# Patient Record
Sex: Male | Born: 1950 | Race: Black or African American | Hispanic: No | Smoking: Never smoker
Health system: Southern US, Community
[De-identification: ages and names within clinical notes are randomized; demographics above are authoritative.]

## PROBLEM LIST (undated history)

## (undated) ENCOUNTER — Emergency Department (HOSPITAL_COMMUNITY): Admission: EM

## (undated) DIAGNOSIS — D7581 Myelofibrosis: Secondary | ICD-10-CM

## (undated) DIAGNOSIS — M1990 Unspecified osteoarthritis, unspecified site: Secondary | ICD-10-CM

## (undated) DIAGNOSIS — C61 Malignant neoplasm of prostate: Secondary | ICD-10-CM

## (undated) DIAGNOSIS — Z923 Personal history of irradiation: Secondary | ICD-10-CM

## (undated) DIAGNOSIS — F1010 Alcohol abuse, uncomplicated: Secondary | ICD-10-CM

## (undated) DIAGNOSIS — D649 Anemia, unspecified: Secondary | ICD-10-CM

## (undated) DIAGNOSIS — G5600 Carpal tunnel syndrome, unspecified upper limb: Secondary | ICD-10-CM

## (undated) HISTORY — PX: TENDON REPAIR: SHX5111

## (undated) HISTORY — DX: Myelofibrosis: D75.81

## (undated) HISTORY — DX: Personal history of irradiation: Z92.3

## (undated) HISTORY — DX: Carpal tunnel syndrome, unspecified upper limb: G56.00

## (undated) HISTORY — DX: Alcohol abuse, uncomplicated: F10.10

## (undated) HISTORY — PX: CYSTECTOMY: SUR359

## (undated) HISTORY — DX: Anemia, unspecified: D64.9

## (undated) HISTORY — DX: Unspecified osteoarthritis, unspecified site: M19.90

---

## 2002-09-03 ENCOUNTER — Encounter: Payer: Self-pay | Admitting: General Practice

## 2002-09-03 ENCOUNTER — Encounter: Admission: RE | Admit: 2002-09-03 | Discharge: 2002-09-03 | Payer: Self-pay | Admitting: General Practice

## 2006-10-17 ENCOUNTER — Ambulatory Visit (HOSPITAL_COMMUNITY): Admission: RE | Admit: 2006-10-17 | Discharge: 2006-10-17 | Payer: Self-pay | Admitting: General Surgery

## 2008-03-17 ENCOUNTER — Ambulatory Visit (HOSPITAL_COMMUNITY): Admission: RE | Admit: 2008-03-17 | Discharge: 2008-03-17 | Payer: Self-pay | Admitting: Family Medicine

## 2008-03-17 ENCOUNTER — Encounter: Payer: Self-pay | Admitting: Orthopedic Surgery

## 2008-03-18 ENCOUNTER — Encounter: Payer: Self-pay | Admitting: Orthopedic Surgery

## 2008-04-14 ENCOUNTER — Ambulatory Visit: Payer: Self-pay | Admitting: Orthopedic Surgery

## 2008-04-14 DIAGNOSIS — M109 Gout, unspecified: Secondary | ICD-10-CM | POA: Insufficient documentation

## 2008-05-12 ENCOUNTER — Ambulatory Visit (HOSPITAL_COMMUNITY): Admission: RE | Admit: 2008-05-12 | Discharge: 2008-05-12 | Payer: Self-pay | Admitting: General Surgery

## 2009-08-10 ENCOUNTER — Ambulatory Visit (HOSPITAL_COMMUNITY): Admission: RE | Admit: 2009-08-10 | Discharge: 2009-08-10 | Payer: Self-pay | Admitting: Family Medicine

## 2009-08-13 ENCOUNTER — Ambulatory Visit (HOSPITAL_COMMUNITY): Admission: RE | Admit: 2009-08-13 | Discharge: 2009-08-13 | Payer: Self-pay | Admitting: Family Medicine

## 2010-06-09 ENCOUNTER — Emergency Department (HOSPITAL_COMMUNITY): Admission: EM | Admit: 2010-06-09 | Discharge: 2010-06-09 | Payer: Self-pay | Admitting: Emergency Medicine

## 2010-08-18 IMAGING — CR DG LUMBAR SPINE COMPLETE 4+V
5 series · 5 of 5 positions shown · non-contrast
Comparison: None available.

CLINICAL DATA: Low back pain.

LUMBAR SPINE - COMPLETE 4+ VIEW

[view not recorded (1 of 5)]
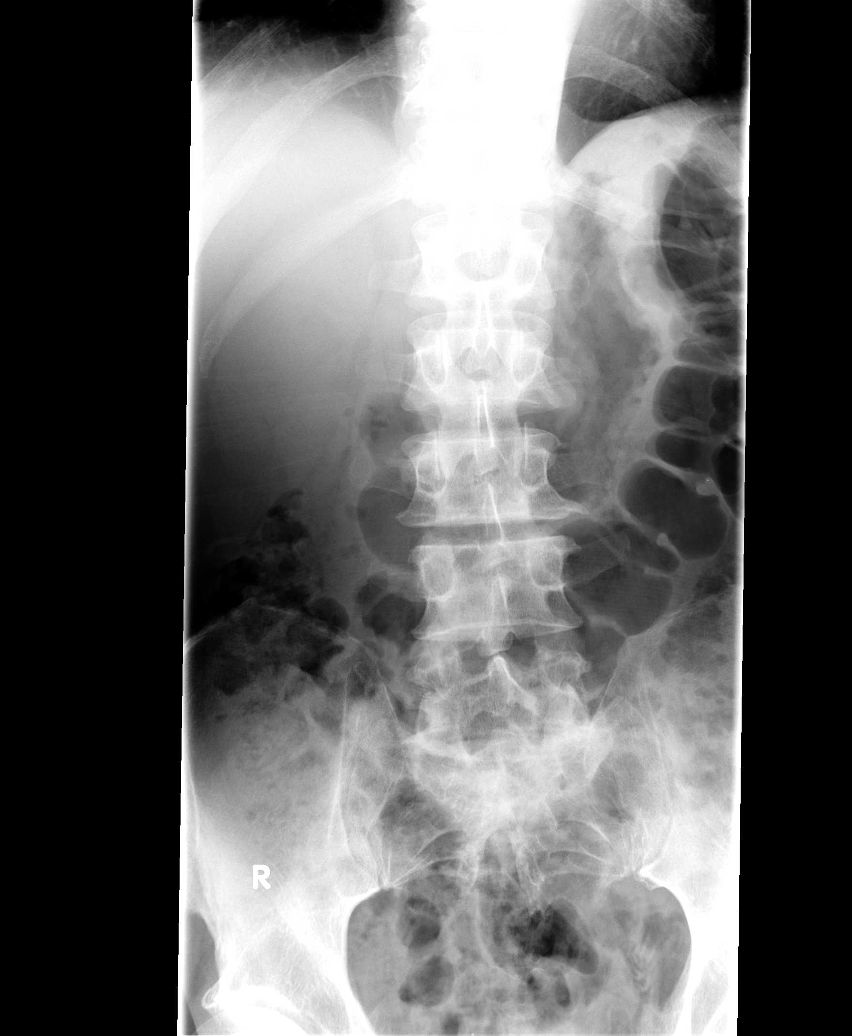

[view not recorded (2 of 5)]
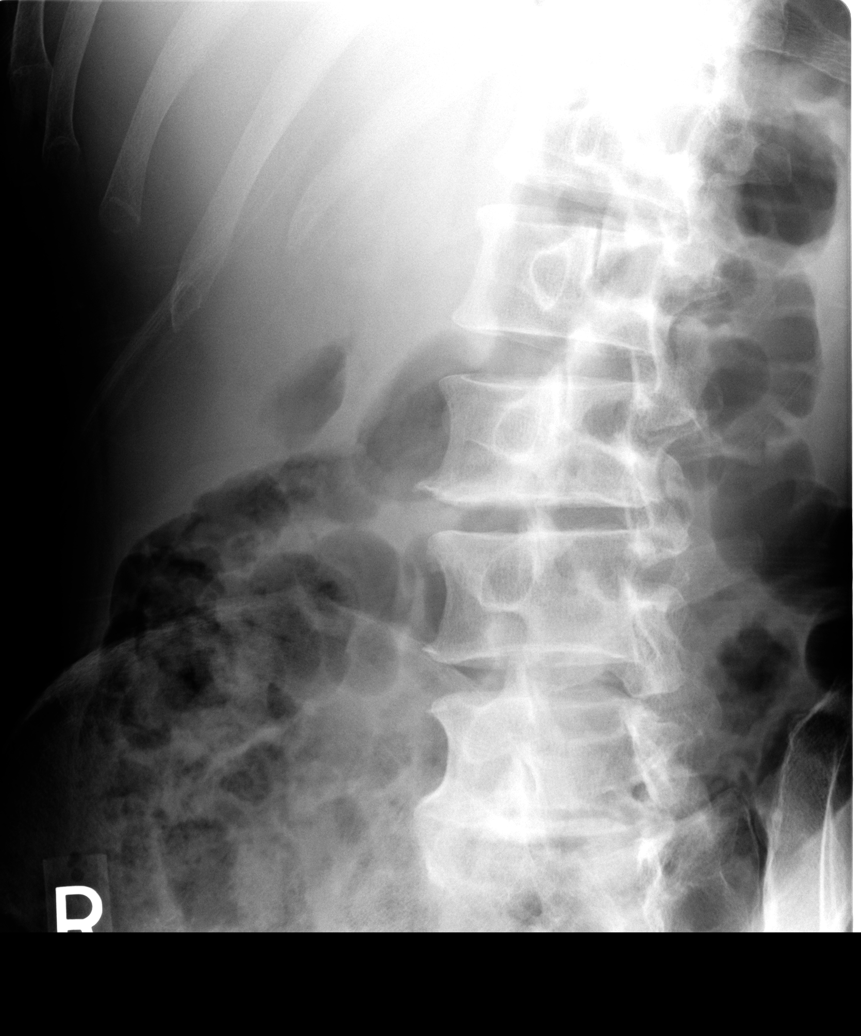

[view not recorded (3 of 5)]
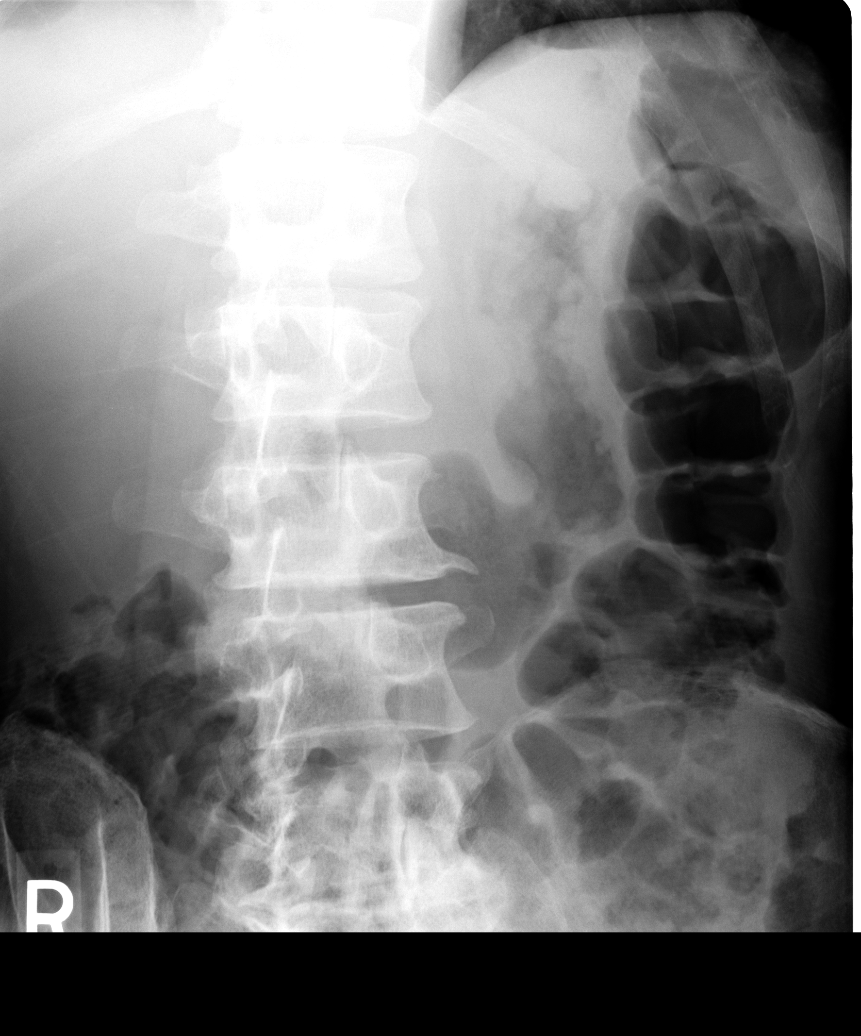

[view not recorded (4 of 5)]
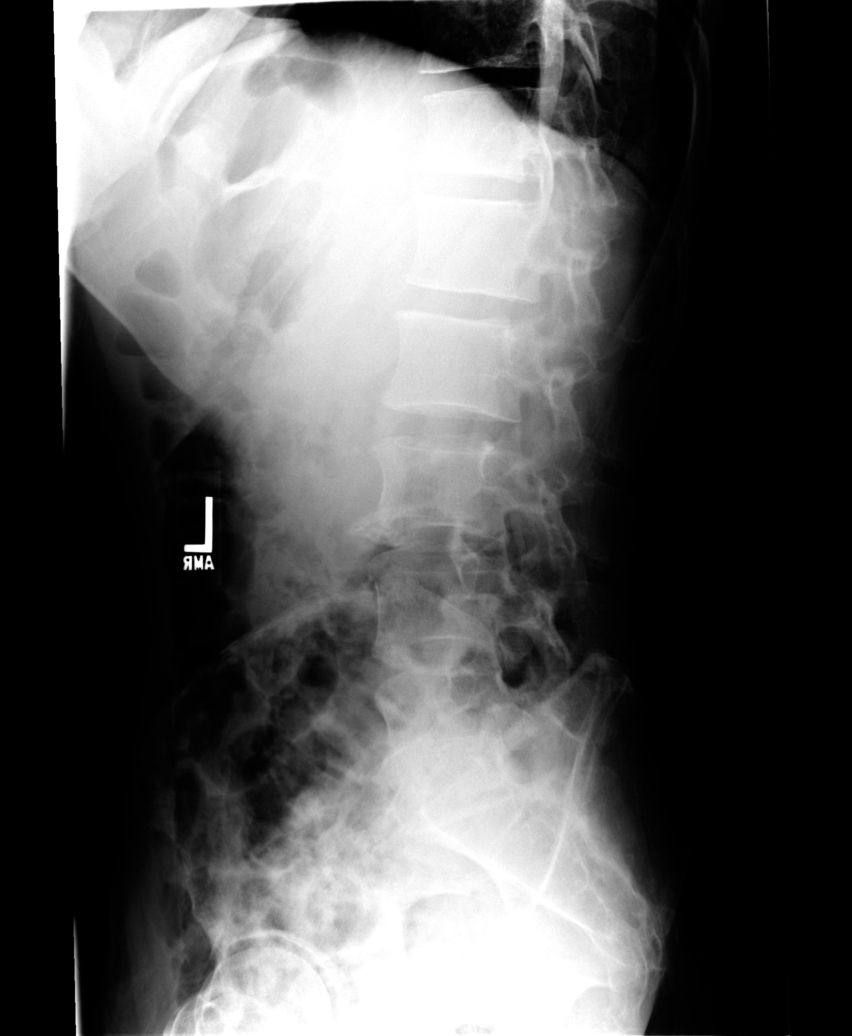

[view not recorded (5 of 5)]
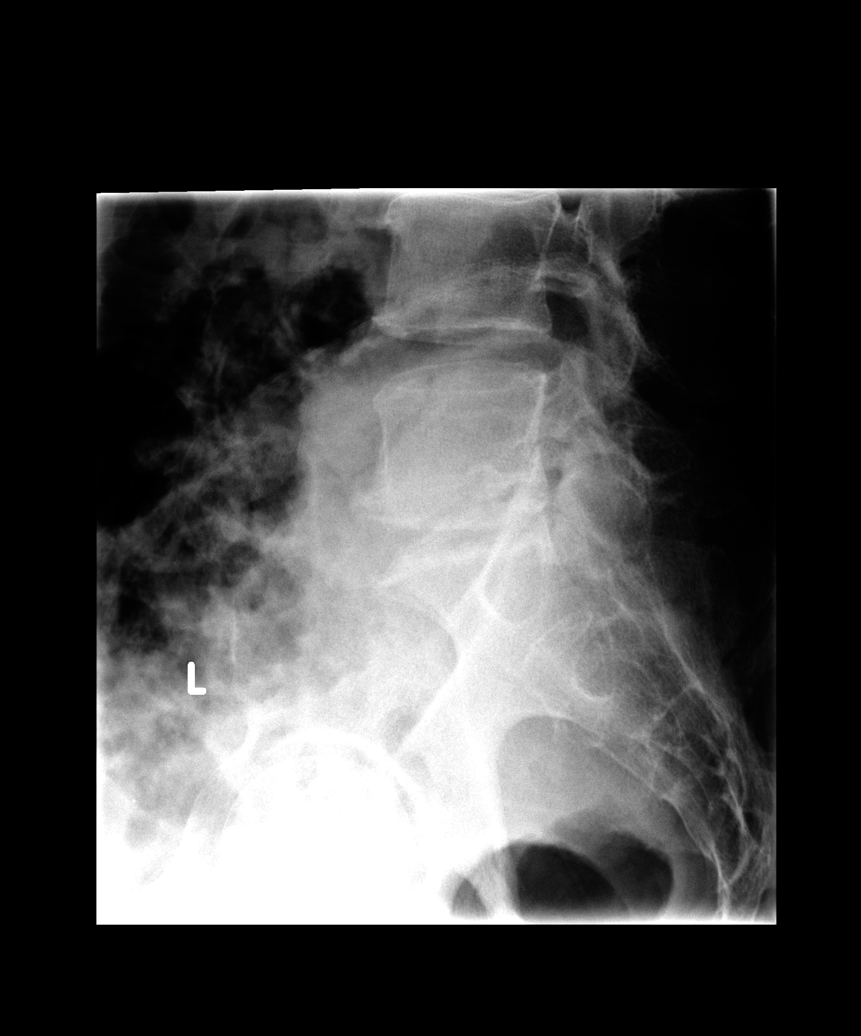

[5 of 5 positions shown; findings below may reference images not displayed]

FINDINGS: Vertebral body height and alignment are maintained.
Anterior endplate spurring is most prominent at L3-4 and L5-S1.
IMPRESSION: No acute finding.  Lumbar degenerative disease noted.

## 2011-03-05 LAB — URIC ACID: Uric Acid, Serum: 7.8 mg/dL (ref 4.0–7.8)

## 2011-05-02 NOTE — H&P (Signed)
NAME:  Ricardo [name], Ricardo [name]              ACCOUNT NO.:  [id number]   MEDICAL RECORD NO.:  [id number]          PATIENT TYPE:  AMB   LOCATION:  DAY                           FACILITY:  [facility]   PHYSICIAN:  Dalia Heading, M.D.  DATE OF BIRTH:  06-Nov-1951   DATE OF ADMISSION:  DATE OF DISCHARGE:  LH                              HISTORY & PHYSICAL   CHIEF COMPLAINT:  Need for screening colonoscopy.   HISTORY OF PRESENT ILLNESS:  The patient is a 60 year old black male who  is referred for endoscopic evaluation.  He needs colonoscopy for  screening purposes.  No abdominal pain, weight loss, nausea, vomiting,  diarrhea, constipation, melena, or hematochezia have been noted.  He has  never had a colonoscopy.  There is no family history of colon carcinoma.   PAST MEDICAL HISTORY:  Unremarkable.   PAST SURGICAL HISTORY:  Right hand surgery.   CURRENT MEDICATIONS:  None.   ALLERGIES:  No known drug allergies.   REVIEW OF SYSTEMS:  Noncontributory.   PHYSICAL EXAMINATION:  GENERAL:  The patient is a well-developed and  well-nourished black male in no acute distress.  LUNGS:  Clear to auscultation with equal breath sounds bilaterally.  HEART:  Regular rate and rhythm without S3, S4, or murmurs.  ABDOMEN:  Soft, nontender, and nondistended.  No hepatosplenomegaly or  masses are noted.  RECTAL:  Deferred to the procedure.   IMPRESSION:  Need for screening colonoscopy.   PLAN:  The patient is scheduled for a colonoscopy on 05/12/2008.  The  risks and benefits of the procedure including bleeding and perforation  were fully explained to the patient, given informed consent.      Dalia Heading, M.D.  Electronically Signed     MAJ/MEDQ  D:  05/05/2008  T:  05/06/2008  Job:  [id number]   cc:   Patrica Duel, M.D.  Fax: [phone]

## 2011-08-07 ENCOUNTER — Ambulatory Visit (INDEPENDENT_AMBULATORY_CARE_PROVIDER_SITE_OTHER): Payer: BC Managed Care – PPO | Admitting: Family Medicine

## 2011-08-07 ENCOUNTER — Encounter: Payer: Self-pay | Admitting: Family Medicine

## 2011-08-07 VITALS — BP 132/78 | HR 77 | Ht 71.0 in | Wt 144.0 lb

## 2011-08-07 DIAGNOSIS — M25449 Effusion, unspecified hand: Secondary | ICD-10-CM

## 2011-08-07 DIAGNOSIS — F17200 Nicotine dependence, unspecified, uncomplicated: Secondary | ICD-10-CM

## 2011-08-07 DIAGNOSIS — Z720 Tobacco use: Secondary | ICD-10-CM

## 2011-08-07 DIAGNOSIS — M109 Gout, unspecified: Secondary | ICD-10-CM

## 2011-08-07 MED ORDER — ALLOPURINOL 100 MG PO TABS: 100.0000 mg | ORAL_TABLET | Freq: Two times a day (BID) | ORAL | Status: DC

## 2011-08-07 MED ORDER — INDOMETHACIN 50 MG PO CAPS: 50.0000 mg | ORAL_CAPSULE | Freq: Three times a day (TID) | ORAL | Status: DC | PRN

## 2011-08-07 NOTE — Assessment & Plan Note (Signed)
The patient was given a refill on his allopurinol. I will increase the dose to 100 mg twice a day. He was also given a refill on indomethacin. I will check his uric acid levels. I will also check for other arthropathies such as rheumatoid factor

## 2011-08-07 NOTE — Patient Instructions (Addendum)
Restart the gout medication I want to set you up for another x-ray of your hand and labwork I recommend that you quit dipping snuff Follow-up visit in 6 months

## 2011-08-07 NOTE — Assessment & Plan Note (Signed)
I will send him for repeat x-rays. I did review the one from 2009. There was some suggestive changes of rheumatoid arthritis. RF factor will be obtained. Electrolytes will be obtained. She will continue medications as per above. If anything is abnormal he will be sent to rheumatology. He is a veteran so he may be able to see a specialist at the [facility] if we cannot find a rheumatologist close by and accepts his insurance.

## 2011-08-07 NOTE — Assessment & Plan Note (Signed)
Patient counseled to quit the use of snuff , discused that this also has harmful effects just like tobacco.
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
## 2011-08-09 ENCOUNTER — Ambulatory Visit (HOSPITAL_COMMUNITY)
Admission: RE | Admit: 2011-08-09 | Discharge: 2011-08-09 | Disposition: A | Discharge status: Home / Self Care | Payer: PRIVATE HEALTH INSURANCE | Source: Ambulatory Visit | Attending: Family Medicine | Admitting: Family Medicine

## 2011-08-09 DIAGNOSIS — M899 Disorder of bone, unspecified: Secondary | ICD-10-CM | POA: Insufficient documentation

## 2011-08-09 DIAGNOSIS — M25549 Pain in joints of unspecified hand: Secondary | ICD-10-CM | POA: Insufficient documentation

## 2011-08-09 DIAGNOSIS — M109 Gout, unspecified: Secondary | ICD-10-CM | POA: Insufficient documentation

## 2011-08-09 DIAGNOSIS — M25449 Effusion, unspecified hand: Secondary | ICD-10-CM

## 2011-08-10 ENCOUNTER — Telehealth: Payer: Self-pay | Admitting: Family Medicine

## 2011-08-10 LAB — BASIC METABOLIC PANEL
BUN: 24 mg/dL — ABNORMAL HIGH (ref 6–23)
CO2: 29 mEq/L (ref 19–32)
Calcium: 10 mg/dL (ref 8.4–10.5)
Glucose, Bld: 129 mg/dL — ABNORMAL HIGH (ref 70–99)
Sodium: 138 mEq/L (ref 135–145)

## 2011-08-10 LAB — RHEUMATOID FACTOR: Rheumatoid fact SerPl-aCnc: <10 [IU]/mL

## 2011-08-10 LAB — URIC ACID: Uric Acid, Serum: 8.3 mg/dL — ABNORMAL HIGH (ref 4.0–7.8)

## 2011-08-10 NOTE — Telephone Encounter (Signed)
Patient aware.

## 2011-08-10 NOTE — Telephone Encounter (Signed)
Left a voice message. His x-rays of his hand show similar arthritis from 2009. His gout level was elevated, his test for rheumatoid arthritis was normal. I want him to continue on with allopurinol as prescribed. We will have a recheck in 6 months. His kidney function was normal.

## 2011-08-13 ENCOUNTER — Encounter: Payer: Self-pay | Admitting: Family Medicine

## 2012-02-08 ENCOUNTER — Ambulatory Visit: Payer: PRIVATE HEALTH INSURANCE | Admitting: Family Medicine

## 2012-02-09 ENCOUNTER — Encounter: Payer: Self-pay | Admitting: Family Medicine

## 2012-12-18 HISTORY — PX: PROSTATECTOMY: SHX69

## 2016-08-10 ENCOUNTER — Encounter (HOSPITAL_COMMUNITY): Payer: Self-pay

## 2016-08-10 ENCOUNTER — Emergency Department (HOSPITAL_COMMUNITY): Payer: Non-veteran care

## 2016-08-10 ENCOUNTER — Emergency Department (HOSPITAL_COMMUNITY)
Admission: EM | Admit: 2016-08-10 | Discharge: 2016-08-10 | Disposition: A | Discharge status: Home / Self Care | Payer: Non-veteran care | Attending: Emergency Medicine | Admitting: Emergency Medicine

## 2016-08-10 DIAGNOSIS — Y999 Unspecified external cause status: Secondary | ICD-10-CM | POA: Diagnosis not present

## 2016-08-10 DIAGNOSIS — W010XXA Fall on same level from slipping, tripping and stumbling without subsequent striking against object, initial encounter: Secondary | ICD-10-CM | POA: Diagnosis not present

## 2016-08-10 DIAGNOSIS — S4992XA Unspecified injury of left shoulder and upper arm, initial encounter: Secondary | ICD-10-CM | POA: Diagnosis present

## 2016-08-10 DIAGNOSIS — Y939 Activity, unspecified: Secondary | ICD-10-CM | POA: Diagnosis not present

## 2016-08-10 DIAGNOSIS — S42002A Fracture of unspecified part of left clavicle, initial encounter for closed fracture: Secondary | ICD-10-CM

## 2016-08-10 DIAGNOSIS — S42032A Displaced fracture of lateral end of left clavicle, initial encounter for closed fracture: Secondary | ICD-10-CM | POA: Insufficient documentation

## 2016-08-10 DIAGNOSIS — Y929 Unspecified place or not applicable: Secondary | ICD-10-CM | POA: Diagnosis not present

## 2016-08-10 MED ORDER — OXYCODONE-ACETAMINOPHEN 5-325 MG PO TABS
1.0000 | ORAL_TABLET | Freq: Once | ORAL | Status: AC
  Administered 2016-08-10: 1 via ORAL
  Filled 2016-08-10: qty 1

## 2016-08-10 MED ORDER — HYDROCODONE-ACETAMINOPHEN 5-325 MG PO TABS: 1.0000 | ORAL_TABLET | ORAL | 0 refills | Status: DC | PRN

## 2016-08-10 NOTE — ED Provider Notes (Signed)
[facility] DEPT Provider Note   CSN: [id number] Arrival date & time: 08/10/16  1227     History   Chief Complaint Chief Complaint  Patient presents with  . Shoulder Injury    HPI Ricardo [name] is a 65 y.o. male.  He tripped and fell onto an abduct his shoulder last night. Complains of pain and anterior aspect of his left shoulder. Did not strike his head. Did not injure his neck or back. No other areas of pain or injury.  HPI  Past Medical History:  Diagnosis Date  . Alcohol abuse   . Arthritis   . Carpal tunnel syndrome   . Gout     Patient Active Problem List   Diagnosis Date Noted  . Swelling of joint of hand 08/07/2011  . Snuff user 08/07/2011  . GOUT, UNSPECIFIED 04/14/2008    Past Surgical History:  Procedure Laterality Date  . CYSTECTOMY     left bicept  . TENDON REPAIR     right thumb       Home Medications    Prior to Admission medications   Medication Sig Start Date End Date Taking? Authorizing Provider  allopurinol (ZYLOPRIM) 100 MG tablet Take 1 tablet (100 mg total) by mouth 2 (two) times daily. 08/07/11   Alycia Rossetti, MD  HYDROcodone-acetaminophen (NORCO/VICODIN) 5-325 MG tablet Take 1 tablet by mouth every 4 (four) hours as needed. 08/10/16   Tanna Furry, MD  indomethacin (INDOCIN) 50 MG capsule Take 1 capsule (50 mg total) by mouth 3 (three) times daily as needed. 08/07/11   Alycia Rossetti, MD    Family History Family History  Problem Relation Age of Onset  . Heart disease Mother   . Hypertension Sister     Social History Social History  Substance Use Topics  . Smoking status: Never Smoker  . Smokeless tobacco: Current User    Types: Snuff  . Alcohol use Yes     Comment: daily - beer or liquor     Allergies   Review of patient's allergies indicates no known allergies.   Review of Systems Review of Systems  Constitutional: Negative for appetite change, chills, diaphoresis, fatigue and fever.  HENT: Negative for  mouth sores, sore throat and trouble swallowing.   Eyes: Negative for visual disturbance.  Respiratory: Negative for cough, chest tightness, shortness of breath and wheezing.   Cardiovascular: Negative for chest pain.  Gastrointestinal: Negative for abdominal distention, abdominal pain, diarrhea, nausea and vomiting.  Endocrine: Negative for polydipsia, polyphagia and polyuria.  Genitourinary: Negative for dysuria, frequency and hematuria.  Musculoskeletal: Negative for gait problem.       Left shoulder pain.  Skin: Negative for color change, pallor and rash.  Neurological: Negative for dizziness, syncope, light-headedness and headaches.  Hematological: Does not bruise/bleed easily.  Psychiatric/Behavioral: Negative for behavioral problems and confusion.     Physical Exam Updated Vital Signs BP 144/89   Pulse 91   Temp 98.8 F (37.1 C) (Oral)   Resp 16   Ht 6' (1.829 m)   Wt 165 lb (74.8 kg)   SpO2 100%   BMI 22.38 kg/m   Physical Exam  Constitutional: He is oriented to person, place, and time. He appears well-developed and well-nourished. No distress.  HENT:  Head: Normocephalic.  Eyes: Conjunctivae are normal. Pupils are equal, round, and reactive to light. No scleral icterus.  Neck: Normal range of motion. Neck supple. No thyromegaly present.  Cardiovascular: Normal rate and regular rhythm.  Exam  reveals no gallop and no friction rub.   No murmur heard. Pulmonary/Chest: Effort normal and breath sounds normal. No respiratory distress. He has no wheezes. He has no rales.  Abdominal: Soft. Bowel sounds are normal. He exhibits no distension. There is no tenderness. There is no rebound.  Musculoskeletal: Normal range of motion.  Ecchymosis on address back to the left shoulder and the distal clavicle with tenderness. Shoulder is clinically not dislocated.  Neurological: He is alert and oriented to person, place, and time.  Skin: Skin is warm and dry. No rash noted.    Psychiatric: He has a normal mood and affect. His behavior is normal.     ED Treatments / Results  Labs (all labs ordered are listed, but only abnormal results are displayed) Labs Reviewed - No data to display  EKG  EKG Interpretation None       Radiology Dg Clavicle Left  Result Date: 08/10/2016 CLINICAL DATA:  Status post fall with left shoulder pain. EXAM: LEFT CLAVICLE - 2+ VIEWS COMPARISON:  None. FINDINGS: There is a comminuted distracted distal left clavicular fracture with 5 mm distraction at the fracture line and inferior dislocation of the distal fracture fragment. A comminuted fragment is seen inferior to the clavicle. IMPRESSION: Comminuted distracted distal left clavicular fracture. Electronically Signed   By: Fidela Salisbury M.D.   On: 08/10/2016 13:28   Dg Shoulder Left  Result Date: 08/10/2016 CLINICAL DATA:  Status post fall with left shoulder pain. EXAM: LEFT SHOULDER - 2+ VIEW COMPARISON:  None. FINDINGS: There is no evidence of left shoulder fracture or dislocation. Comminuted angulated distal left clavicular fracture better seen on dedicated clavicular views. There is no evidence of arthropathy or other focal bone abnormality. Soft tissues are unremarkable. IMPRESSION: No acute fracture or dislocation identified about the left shoulder. Comminuted distal left clavicular fracture. Electronically Signed   By: Fidela Salisbury M.D.   On: 08/10/2016 13:25    Procedures Procedures (including critical care time)  Medications Ordered in ED Medications  oxyCODONE-acetaminophen (PERCOCET/ROXICET) 5-325 MG per tablet 1 tablet (1 tablet Oral Given 08/10/16 1402)     Initial Impression / Assessment and Plan / ED Course  I have reviewed the triage vital signs and the nursing notes.  Pertinent labs & imaging results that were available during my care of the patient were reviewed by me and considered in my medical decision making (see chart for  details).  Clinical Course    Placed in a sling. Discussed outpatient treatment. Orthopedic follow-up given. Vicodin for pain. Do not take Vicodin when taking tramadol which he takes for his chronic arthritis.  Final Clinical Impressions(s) / ED Diagnoses   Final diagnoses:  Clavicle fracture, left, closed, initial encounter    New Prescriptions New Prescriptions   HYDROCODONE-ACETAMINOPHEN (NORCO/VICODIN) 5-325 MG TABLET    Take 1 tablet by mouth every 4 (four) hours as needed.     Tanna Furry, MD 08/10/16 [phone]

## 2016-08-10 NOTE — Discharge Instructions (Signed)
Wear sling to reduce pain.  Apply ice packs to her shoulder 2-3 times per day.  Sling may be removed for bathing.  Call Dr. Aline Brochure, orthopedic surgeon, for recheck appointment in 1-2 weeks.

## 2016-08-10 NOTE — ED Triage Notes (Signed)
STates he tripped last night in the dark and landed on left shoulder.

## 2017-02-19 ENCOUNTER — Encounter: Payer: Self-pay | Admitting: Radiation Oncology

## 2017-02-21 ENCOUNTER — Encounter: Payer: Self-pay | Admitting: Radiation Oncology

## 2017-02-21 DIAGNOSIS — C61 Malignant neoplasm of prostate: Secondary | ICD-10-CM | POA: Insufficient documentation

## 2017-02-21 NOTE — Progress Notes (Signed)
GU Location of Tumor / Histology: prostatic adenocarcinoma  If Prostate Cancer, Gleason Score is (3 + 4) and PSA is (2.6). Prostate volume: 28 cc.    Prostatectomy pathology:   Past/Anticipated interventions by urology, if any: prostatectomy, referred to Dr. Leilani Merl, [facility] oncologist  Past/Anticipated interventions by medical oncology, if any: referred to radiation oncology for treatment of positive margin  Weight changes, if any: No. Reports he has gained 10 lb since his prostatectomy.   Bowel/Bladder complaints, if any: IPSS 3. Denies dysuria or hematuria. Reports occasional leakage   Nausea/Vomiting, if any: no  Pain issues, if any:  Intermittent low back pain and shoulder pain related to degenerative changes  SAFETY ISSUES:  Prior radiation? No  Pacemaker/ICD? No  Possible current pregnancy? No  Is the patient on methotrexate? No  Current Complaints / other details:  66 year old male.

## 2017-02-22 ENCOUNTER — Encounter: Payer: Self-pay | Admitting: Medical Oncology

## 2017-02-22 ENCOUNTER — Encounter: Payer: Self-pay | Admitting: Radiation Oncology

## 2017-02-22 ENCOUNTER — Ambulatory Visit
Admission: RE | Admit: 2017-02-22 | Discharge: 2017-02-22 | Disposition: A | Discharge status: Home / Self Care | Payer: Non-veteran care | Source: Ambulatory Visit | Attending: Radiation Oncology | Admitting: Radiation Oncology

## 2017-02-22 ENCOUNTER — Other Ambulatory Visit: Payer: Self-pay | Admitting: Radiation Oncology

## 2017-02-22 DIAGNOSIS — C61 Malignant neoplasm of prostate: Secondary | ICD-10-CM | POA: Insufficient documentation

## 2017-02-22 DIAGNOSIS — Z8249 Family history of ischemic heart disease and other diseases of the circulatory system: Secondary | ICD-10-CM | POA: Diagnosis not present

## 2017-02-22 DIAGNOSIS — Z79899 Other long term (current) drug therapy: Secondary | ICD-10-CM | POA: Diagnosis not present

## 2017-02-22 DIAGNOSIS — N529 Male erectile dysfunction, unspecified: Secondary | ICD-10-CM | POA: Diagnosis not present

## 2017-02-22 DIAGNOSIS — Z9079 Acquired absence of other genital organ(s): Secondary | ICD-10-CM | POA: Diagnosis not present

## 2017-02-22 DIAGNOSIS — Z510 Encounter for antineoplastic radiation therapy: Secondary | ICD-10-CM | POA: Diagnosis present

## 2017-02-22 DIAGNOSIS — D471 Chronic myeloproliferative disease: Secondary | ICD-10-CM | POA: Diagnosis not present

## 2017-02-22 DIAGNOSIS — M1990 Unspecified osteoarthritis, unspecified site: Secondary | ICD-10-CM | POA: Insufficient documentation

## 2017-02-22 DIAGNOSIS — M109 Gout, unspecified: Secondary | ICD-10-CM | POA: Diagnosis not present

## 2017-02-22 HISTORY — DX: Malignant neoplasm of prostate: C61

## 2017-02-22 NOTE — Progress Notes (Signed)
Introduced myself to Mr. Ghrist and his daughter as the prostate navigator and my role. I gave them my business card and will follow during treatment. I asked them to call me with questions or concerns.

## 2017-02-22 NOTE — Progress Notes (Signed)
See progress note under physician encounter. 

## 2017-02-22 NOTE — Progress Notes (Signed)
Radiation Oncology         [phone] ________________________________  Initial Outpatient Consultation  Name: Ricardo [name] MRN: [id number]  Date: 02/22/2017  DOB: April 05, 1951  ON:GEXBMWUX NOT IN SYSTEM  Darlin Priestly, DO   REFERRING PHYSICIAN: Darlin Priestly, DO  DIAGNOSIS: 66 y.o. 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 Prostate cancer ([place]) 185 C61    HISTORY OF PRESENT ILLNESS: Ricardo [name] is a 66 y.o. male with a history of Stage 2A T2c, N0, M0 adenocarcinoma of the prostate. He was noted to have an elevated PSA of 9.25 by his primary care physician at the [facility].  Accordingly, he was referred for evaluation in urology by Dr. Essie Christine. Vito Berger in 09/2016. MRI of the prostate on 01/20/16 showed a 0.9 cm nodule in the right posterior lateral segment of the prostate at the base to mid segment consistent with prostate carcinoma. Although the nodule abuts the capsule, no capsular disruption or finding suspicious for extracapsular extension was seen. No adenopathy was seen either. The patient proceeded to transrectal ultrasound with 12 biopsies of the prostate on 4/24//2017.  The prostate volume measured 28 cc.  Out of 12 core biopsies, 2 were positive.  The maximum Gleason score was 3+4=7, and this was seen in right mid core.  He underwent radical prostatectomy on 10/02/2016 and pathology returned Gleason 3+4 = 7 prostate adenocarcinoma with focally positive margins at the lateral right base but no extracapsular extension or involvement of the seminal vesicles. Post-treatment PSA obtained 10/11/2016 was 1.8 and repeat PSA on 11/16/2016 was 0.1. The patient has some mild residual urinary incontinence postoperatively but reports that this is gradually improving and he does not currently require the use of pads. He also reports postoperative erectile dysfunction.  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  The patient reviewed the postoperative pathology results with his urologist and he has kindly been referred today for discussion of potential salvage radiation treatment options given his positive margins at the lateral right base.  PSA 11/16/2016: 0.107 02/16/2016: 9.25 10/05/2015: 9.52 05/12/2015: 9.16  PREVIOUS RADIATION THERAPY: No  PAST MEDICAL HISTORY:  Past Medical History:  Diagnosis Date  . Alcohol abuse   . Arthritis   . Carpal tunnel syndrome   . Gout   . Prostate cancer ([place])       PAST SURGICAL HISTORY: Past Surgical History:  Procedure Laterality Date  . CYSTECTOMY     left bicept  . TENDON REPAIR     right thumb    FAMILY HISTORY:  Family History  Problem Relation Age of Onset  . Heart disease Mother   . Hypertension Sister     SOCIAL HISTORY:  Social History   Social History  . Marital status: Divorced    Spouse name: N/A  . Number of children: N/A  . Years of education: N/A   Occupational History  . Not on file.   Social History Main Topics  . Smoking status: Never Smoker  . Smokeless tobacco: Current User    Types: Snuff  . Alcohol use Yes     Comment: daily - beer or liquor  . Drug use: No  . Sexual activity: Not on file   Other Topics Concern  . Not on file   Social History Narrative  . No narrative on file  ALLERGIES: Patient has no known allergies.  MEDICATIONS:  Current Outpatient Prescriptions  Medication Sig Dispense Refill  . allopurinol (ZYLOPRIM) 100 MG tablet Take 1 tablet (100 mg total) by mouth 2 (two) times daily. 60 tablet 6  . HYDROcodone-acetaminophen (NORCO/VICODIN) 5-325 MG tablet Take 1 tablet by mouth every 4 (four) hours as needed. 10 tablet 0  . hydrOXYzine (ATARAX/VISTARIL) 50 MG tablet Take by mouth.    . indomethacin (INDOCIN) 50 MG capsule Take 1 capsule (50 mg total) by mouth 3 (three) times daily as needed. 40  capsule 3  . Multiple Vitamin (MULTIVITAMIN) capsule Take by mouth.    . naproxen (NAPROSYN) 375 MG tablet Take 375 mg by mouth.     No current facility-administered medications for this encounter.     REVIEW OF SYSTEMS:  On review of systems, the patient reports that he is doing well overall. He denies any chest pain, shortness of breath, cough, fevers, chills, night sweats, unintended weight changes. He denies any bowel disturbances, and denies abdominal pain, nausea or vomiting. He reports intermittent lower back pain and shoulder pain related to degenerative changes. His IPSS was 3, indicating mild urinary symptoms. He reports occasional urinary leakage after urination. He does not wear a pad. Moderate ED with a SHIM score of 9. A complete review of systems is obtained and is otherwise negative.    PHYSICAL EXAM:  Wt Readings from Last 3 Encounters:  02/22/17 153 lb 9.6 oz (69.7 kg)  08/10/16 165 lb (74.8 kg)  08/07/11 144 lb 0.6 oz (65.3 kg)   Temp Readings from Last 3 Encounters:  08/10/16 98.8 F (37.1 C) (Oral)   BP Readings from Last 3 Encounters:  08/10/16 152/94  08/07/11 132/78   Pulse Readings from Last 3 Encounters:  08/10/16 80  08/07/11 77  04/14/08 70    0/10  In general this is a well appearing African-American male in no acute distress. He is alert and oriented x4 and appropriate throughout the examination. HEENT reveals that the patient is normocephalic, atraumatic. EOMs are intact. PERRLA. Skin is intact without any evidence of gross lesions. Cardiovascular exam reveals a regular rate and rhythm, no clicks rubs or murmurs are auscultated. Chest is clear to auscultation bilaterally. Lymphatic assessment is performed and does not reveal any adenopathy in the cervical, supraclavicular, or axillary chains. Abdomen has active bowel sounds in all quadrants and is intact. The abdomen is soft, non tender, non distended. Lower extremities are negative for pretibial pitting  edema, deep calf tenderness, cyanosis or clubbing.   KPS = 90  100 - Normal; no complaints; no evidence of disease. 90   - Able to carry on normal activity; minor signs or symptoms of disease. 80   - Normal activity with effort; some signs or symptoms of disease. 42   - Cares for self; unable to carry on normal activity or to do active work. 60   - Requires occasional assistance, but is able to care for most of his personal needs. 50   - Requires considerable assistance and frequent medical care. 45   - Disabled; requires special care and assistance. 66   - Severely disabled; hospital admission is indicated although death not imminent. 40   - Very sick; hospital admission necessary; active supportive treatment necessary. 10   - Moribund; fatal processes progressing rapidly. 0     - Dead  Karnofsky DA, Abelmann WH, Craver LS and Burchenal [facility] [phone]) The use of the nitrogen mustards in the palliative  treatment of carcinoma: with particular reference to bronchogenic carcinoma Cancer 1 634-56  LABORATORY DATA:  No results found for: WBC, HGB, HCT, MCV, PLT Lab Results  Component Value Date   NA 138 08/07/2011   K 4.4 08/07/2011   CL 99 08/07/2011   CO2 29 08/07/2011   No results found for: ALT, AST, GGT, ALKPHOS, BILITOT   RADIOGRAPHY: No results found.    IMPRESSION/PLAN: 1. 66 y.o. gentleman with Stage IIA (pT2c,pN0,M0) adenocarcinoma of the prostate, Gleason Score 3+4, status post-prostatectomy with a PSA of 0.1  The patient is status post prostatectomy with positive margins and a post-surgical PSA of 0.1. There is a high risk of cancer recurrence due to the positive margins. Therefore, the patient appears to be a good candidate for salvage radiation to the prostate bed.  Today we reviewed with the patient and his daughter, the findings and workup thus far.  We discussed the natural history of prostate cancer.  We reviewed the the implications of T-stage, Gleason's Score, and PSA  on decision-making and outcomes in prostate cancer.  We discussed radiation treatment in the management of prostate cancer with regard to the logistics and delivery of external beam radiation treatment.  The patient would like to proceed with prostate IMRT. I will share my findings with Dr. Leilani Merl and move forward with CT simulation which is scheduled for 03/30/17 at 11am.  The patient also has transportation issues in which his daughter might not be able to bring him some days. We will have social work help him with this. The patient is interested in potential transportation options from [facility]. We will investigate this.  We enjoyed meeting with him today, and will look forward to participating in the care of this very nice gentleman.    Nicholos Johns, PA-C    Tyler Pita, MD  [facility] Direct Dial: [phone]  Fax: [phone] [email or web address]  Skype  [email or web address]  This document serves as a record of services personally performed by Freeman Caldron, PA-C and Tyler Pita, MD. It was created on their behalf by Darcus Austin, a trained medical scribe. The creation of this record is based on the scribe's personal observations and the providers' statements to them. This document has been checked and approved by the attending provider.

## 2017-03-14 ENCOUNTER — Encounter: Payer: Self-pay | Admitting: *Deleted

## 2017-03-14 NOTE — Progress Notes (Signed)
[place] Psychosocial Distress Screening Clinical Social Work  Clinical Social Work was referred by distress screening protocol.  The patient scored a 5 on the Psychosocial Distress Thermometer which indicates moderate distress. Clinical Social Worker reviewed chart and phoned pt to assess for distress and other psychosocial needs. CSW left brief message for pt introduced self, explained role of CSW/Pt and Family Support Team, support groups and other resources to assist. CSW encouraged pt to return CSW call as able.    ONCBCN DISTRESS SCREENING 02/22/2017  Screening Type Initial Screening  Distress experienced in past week (1-10) 5  Practical problem type Housing  Family Problem type Other (comment)  Emotional problem type Nervousness/Anxiety;Boredom  Information Concerns Type Lack of info about diagnosis;Lack of info about treatment  Physical Problem type Pain;Sleep/insomnia  Physician notified of physical symptoms Yes  Referral to clinical psychology No  Referral to clinical social work No  Referral to dietition No  Referral to financial advocate No  Referral to support programs No  Referral to palliative care No    Clinical Social Worker follow up needed: No.  If yes, follow up plan:  Loren Racer, Marlinda Mike, OSW-C Clinical Social Worker [place]  [facility] Phone: [phone] Fax: [phone]

## 2017-03-29 ENCOUNTER — Telehealth: Payer: Self-pay | Admitting: *Deleted

## 2017-03-29 NOTE — Telephone Encounter (Signed)
CALLED PATIENT TO ASK QUESTION, LVM FOR A RETURN CALL 

## 2017-03-30 ENCOUNTER — Encounter: Payer: Self-pay | Admitting: Oncology

## 2017-03-30 ENCOUNTER — Ambulatory Visit
Admission: RE | Admit: 2017-03-30 | Discharge: 2017-03-30 | Disposition: A | Discharge status: Home / Self Care | Payer: Non-veteran care | Source: Ambulatory Visit | Attending: Radiation Oncology | Admitting: Radiation Oncology

## 2017-03-30 ENCOUNTER — Encounter: Payer: Self-pay | Admitting: *Deleted

## 2017-03-30 DIAGNOSIS — Z510 Encounter for antineoplastic radiation therapy: Secondary | ICD-10-CM | POA: Diagnosis not present

## 2017-03-30 DIAGNOSIS — C61 Malignant neoplasm of prostate: Secondary | ICD-10-CM

## 2017-03-30 NOTE — Progress Notes (Signed)
[place] Work  Clinical Social Work was referred by radiation therapist for possible transportation concerns. CSW phoned pt and left message introduced self, explained role of CSW/Pt and Family Support Team, support groups and other resources to assist. Pt could use [facility], [facility] and [facility] for [facility] for radiation. CSW left information on message for patient and encouraged pt to return call.     Clinical Social Work interventions:  Resource education  Loren Racer, [place], OSW-C Clinical Social Worker [place]  [place] Phone: [phone] Fax: [phone]

## 2017-03-30 NOTE — Progress Notes (Signed)
  Radiation Oncology         [phone] ________________________________  Name: Ricardo [name] MRN: [id number]  Date: 03/30/2017  DOB: 09/07/1951  SIMULATION AND TREATMENT PLANNING NOTE    ICD-9-CM ICD-10-CM   1. Prostate cancer ([place]) 185 C61     DIAGNOSIS:  66 y.o. gentleman with Stage IIA (pT2c,pN0,M0) adenocarcinoma of the prostate, Gleason Score 3+4, status post-prostatectomy with a PSA of 0.1  NARRATIVE:  The patient was brought to the [facility].  Identity was confirmed.  All relevant records and images related to the planned course of therapy were reviewed.  The patient freely provided informed written consent to proceed with treatment after reviewing the details related to the planned course of therapy. The consent form was witnessed and verified by the simulation staff.  Then, the patient was set-up in a stable reproducible supine position for radiation therapy.  A vacuum lock pillow device was custom fabricated to position his legs in a reproducible immobilized position.  Then, I performed a urethrogram under sterile conditions to identify the prostatic apex.  CT images were obtained.  Surface markings were placed.  The CT images were loaded into the planning software.  Then the prostate target and avoidance structures including the rectum, bladder, bowel and hips were contoured.  Treatment planning then occurred.  The radiation prescription was entered and confirmed.  A total of 1 complex treatment devices were fabricated. I have requested : Intensity Modulated Radiotherapy (IMRT) is medically necessary for this case for the following reason:  Rectal sparing.Marland Kitchen  PLAN:  The patient will receive 68.4 Gy in 38 fractions.  ________________________________  Sheral Apley Tammi Klippel, M.D.

## 2017-04-02 ENCOUNTER — Telehealth: Payer: Self-pay | Admitting: *Deleted

## 2017-04-02 NOTE — Telephone Encounter (Signed)
On 04-02-17 [facility] to [facility] it was consult note.

## 2017-04-05 ENCOUNTER — Encounter: Payer: Self-pay | Admitting: *Deleted

## 2017-04-05 NOTE — Progress Notes (Signed)
[place] Work  Clinical Social Work was referred by patient for help with transportation. CSW team had connected with pt last week and pt informed Ricardo [name] that he had rides to treatment and denied needs. Pt now is requesting assistance with transportation for upcoming radiation appointments. CSW reviewed possible options for transportation and pt agreed to [place] contacting Ricardo [name] and [facility] for attempt to obtain rides. CSW encouraged pt to contact [facility] and [facility] as well for possible rides. Pt reports his daughter lives in [place] and works in [place]. She cannot help with transportation as a result. assessment of psychosocial needs.  Clinical Social Work to also make referral to Development worker, community for possible gas card assistance and RN navigator to be aware of this barrier to treatment. CSW team to follow and continue to attempt to [facility] assistance.     Clinical Social Work interventions:  Resource education and referral Pt advocacy Ricardo Racer, LCSW, OSW-C Clinical Social Worker [place]  [place] Phone: [phone] Fax: [phone]

## 2017-04-06 DIAGNOSIS — Z510 Encounter for antineoplastic radiation therapy: Secondary | ICD-10-CM | POA: Diagnosis not present

## 2017-04-09 ENCOUNTER — Encounter: Payer: Self-pay | Admitting: *Deleted

## 2017-04-09 ENCOUNTER — Encounter: Payer: Self-pay | Admitting: Medical Oncology

## 2017-04-09 NOTE — Progress Notes (Signed)
[place] Work  Holiday representative contacted patient to follow up regarding transportation.  Patient stated he contacted [place] transportation and [facility].  [facility] informed patient that they can only provide transportation if the patient is in a wheelchair.  CSW contacted the [place] to confirm.  [facility] informed patient that they can only provide transportation outside the county line if patient has Medicaid.  CSW also contact [facility] to confirm.  CSw also contacted [facility].  They are unable to provide transportation for patients appointment tomorrow, but will continue searching for volunteers for his upcoming appointments.  Patient stated his Aunt would be able to bring him to his appointment tomorrow.  CSw will continue to follow and support as needed.  Johnnye Lana, MSW, LCSW, OSW-C Clinical Social Worker [facility] [phone]

## 2017-04-10 ENCOUNTER — Ambulatory Visit
Admission: RE | Admit: 2017-04-10 | Discharge: 2017-04-10 | Disposition: A | Discharge status: Home / Self Care | Payer: Non-veteran care | Source: Ambulatory Visit | Attending: Radiation Oncology | Admitting: Radiation Oncology

## 2017-04-10 ENCOUNTER — Telehealth: Payer: Self-pay | Admitting: Medical Oncology

## 2017-04-10 DIAGNOSIS — Z510 Encounter for antineoplastic radiation therapy: Secondary | ICD-10-CM | POA: Diagnosis not present

## 2017-04-10 NOTE — Progress Notes (Signed)
Left message with social workers regarding transportation issues with Mr. Ricardo [name]. We should be able to help him with funds from the [place] funds to assist with gas cards or taxi. I will follow up with Mr. [name] when he comes for radiation.

## 2017-04-10 NOTE — Progress Notes (Signed)
Left patient a message to follow up on his transportation here today for treatment. I am working with social work to find transportation assistance to this treatments.

## 2017-04-11 ENCOUNTER — Ambulatory Visit
Admission: RE | Admit: 2017-04-11 | Discharge: 2017-04-11 | Disposition: A | Discharge status: Home / Self Care | Payer: Non-veteran care | Source: Ambulatory Visit | Attending: Radiation Oncology | Admitting: Radiation Oncology

## 2017-04-11 DIAGNOSIS — Z510 Encounter for antineoplastic radiation therapy: Secondary | ICD-10-CM | POA: Diagnosis not present

## 2017-04-12 ENCOUNTER — Ambulatory Visit
Admission: RE | Admit: 2017-04-12 | Discharge: 2017-04-12 | Disposition: A | Discharge status: Home / Self Care | Payer: Non-veteran care | Source: Ambulatory Visit | Attending: Radiation Oncology | Admitting: Radiation Oncology

## 2017-04-12 DIAGNOSIS — Z510 Encounter for antineoplastic radiation therapy: Secondary | ICD-10-CM | POA: Diagnosis not present

## 2017-04-13 ENCOUNTER — Encounter: Payer: Self-pay | Admitting: *Deleted

## 2017-04-13 ENCOUNTER — Ambulatory Visit
Admission: RE | Admit: 2017-04-13 | Discharge: 2017-04-13 | Disposition: A | Discharge status: Home / Self Care | Payer: Non-veteran care | Source: Ambulatory Visit | Attending: Radiation Oncology | Admitting: Radiation Oncology

## 2017-04-13 DIAGNOSIS — Z510 Encounter for antineoplastic radiation therapy: Secondary | ICD-10-CM | POA: Diagnosis not present

## 2017-04-13 NOTE — Progress Notes (Signed)
Ricardo [name]  Clinical Social [name] following for assistance with transportation.  Clinical Social Worker received update from [employer] in [place], [place] and they have found volunteer drivers for May 1,2, 3,8,9,10,15,16,21 and 23. CSW met with pt and updated him on status of this resource. Pt requesting letter from MD in order to obtain gas cards through the [place] for the other days. He thinks family can bring him on the other days. CSW will draft letter with appt schedule attached for pt. Pt doing well and in good spirits today. CSW team to continue to follow and assist.      Clinical Social [name] interventions:  Set designer education and referral  Ricardo Racer, LCSW, OSW-C Clinical Social Worker [place]  [place] Phone: [phone] Fax: [phone]

## 2017-04-16 ENCOUNTER — Ambulatory Visit
Admission: RE | Admit: 2017-04-16 | Discharge: 2017-04-16 | Disposition: A | Discharge status: Home / Self Care | Payer: Non-veteran care | Source: Ambulatory Visit | Attending: Radiation Oncology | Admitting: Radiation Oncology

## 2017-04-16 DIAGNOSIS — Z510 Encounter for antineoplastic radiation therapy: Secondary | ICD-10-CM | POA: Diagnosis not present

## 2017-04-17 ENCOUNTER — Ambulatory Visit
Admission: RE | Admit: 2017-04-17 | Discharge: 2017-04-17 | Disposition: A | Discharge status: Home / Self Care | Payer: Non-veteran care | Source: Ambulatory Visit | Attending: Radiation Oncology | Admitting: Radiation Oncology

## 2017-04-17 DIAGNOSIS — Z510 Encounter for antineoplastic radiation therapy: Secondary | ICD-10-CM | POA: Diagnosis not present

## 2017-04-18 ENCOUNTER — Ambulatory Visit
Admission: RE | Admit: 2017-04-18 | Discharge: 2017-04-18 | Disposition: A | Discharge status: Home / Self Care | Payer: Non-veteran care | Source: Ambulatory Visit | Attending: Radiation Oncology | Admitting: Radiation Oncology

## 2017-04-18 DIAGNOSIS — Z510 Encounter for antineoplastic radiation therapy: Secondary | ICD-10-CM | POA: Diagnosis not present

## 2017-04-19 ENCOUNTER — Ambulatory Visit
Admission: RE | Admit: 2017-04-19 | Discharge: 2017-04-19 | Disposition: A | Discharge status: Home / Self Care | Payer: Non-veteran care | Source: Ambulatory Visit | Attending: Radiation Oncology | Admitting: Radiation Oncology

## 2017-04-19 DIAGNOSIS — Z510 Encounter for antineoplastic radiation therapy: Secondary | ICD-10-CM | POA: Diagnosis not present

## 2017-04-20 ENCOUNTER — Encounter: Payer: Self-pay | Admitting: *Deleted

## 2017-04-20 ENCOUNTER — Ambulatory Visit
Admission: RE | Admit: 2017-04-20 | Discharge: 2017-04-20 | Disposition: A | Discharge status: Home / Self Care | Payer: Non-veteran care | Source: Ambulatory Visit | Attending: Radiation Oncology | Admitting: Radiation Oncology

## 2017-04-20 DIAGNOSIS — Z510 Encounter for antineoplastic radiation therapy: Secondary | ICD-10-CM | POA: Diagnosis not present

## 2017-04-20 NOTE — Progress Notes (Signed)
[place] Work  Clinical Social Work contacted pt to follow up for rides as [facility] contacted CSW.  Clinical Social Work contacted patient at home and confirmed rides through [facility] and [employer]. Pt doing well and appreciated assistance.      Clinical Social Work interventions: Resource asst  Loren Racer, [employer], OSW-C Clinical Social Worker [place]  [place] Phone: [phone] Fax: [phone]

## 2017-04-23 ENCOUNTER — Ambulatory Visit
Admission: RE | Admit: 2017-04-23 | Discharge: 2017-04-23 | Disposition: A | Discharge status: Home / Self Care | Payer: Non-veteran care | Source: Ambulatory Visit | Attending: Radiation Oncology | Admitting: Radiation Oncology

## 2017-04-23 DIAGNOSIS — Z510 Encounter for antineoplastic radiation therapy: Secondary | ICD-10-CM | POA: Diagnosis not present

## 2017-04-24 ENCOUNTER — Ambulatory Visit
Admission: RE | Admit: 2017-04-24 | Discharge: 2017-04-24 | Disposition: A | Discharge status: Home / Self Care | Payer: Non-veteran care | Source: Ambulatory Visit | Attending: Radiation Oncology | Admitting: Radiation Oncology

## 2017-04-24 DIAGNOSIS — Z510 Encounter for antineoplastic radiation therapy: Secondary | ICD-10-CM | POA: Diagnosis not present

## 2017-04-25 ENCOUNTER — Ambulatory Visit
Admission: RE | Admit: 2017-04-25 | Discharge: 2017-04-25 | Disposition: A | Discharge status: Home / Self Care | Payer: Non-veteran care | Source: Ambulatory Visit | Attending: Radiation Oncology | Admitting: Radiation Oncology

## 2017-04-25 DIAGNOSIS — Z510 Encounter for antineoplastic radiation therapy: Secondary | ICD-10-CM | POA: Diagnosis not present

## 2017-04-26 ENCOUNTER — Ambulatory Visit
Admission: RE | Admit: 2017-04-26 | Discharge: 2017-04-26 | Disposition: A | Discharge status: Home / Self Care | Payer: Non-veteran care | Source: Ambulatory Visit | Attending: Radiation Oncology | Admitting: Radiation Oncology

## 2017-04-26 ENCOUNTER — Encounter: Payer: Self-pay | Admitting: *Deleted

## 2017-04-26 DIAGNOSIS — Z510 Encounter for antineoplastic radiation therapy: Secondary | ICD-10-CM | POA: Diagnosis not present

## 2017-04-26 NOTE — Progress Notes (Signed)
[place] Work  Clinical Social Work was referred by patient due to ride mix up. Pt had volunteer driver for today and the volunteer forgot. CSW contacted [place] and they were able to secure another driver for later today. CSW contacted [place] [facility] staff and updated them of plan and changes. Clinical Social Worker will continue to follow and assist accordingly.   Clinical Social Work interventions:  Resource coordination  Loren Racer, [employer], OSW-C Clinical Social Worker [place]  [place] Phone: [phone] Fax: [phone]

## 2017-04-27 ENCOUNTER — Ambulatory Visit
Admission: RE | Admit: 2017-04-27 | Discharge: 2017-04-27 | Disposition: A | Discharge status: Home / Self Care | Payer: Non-veteran care | Source: Ambulatory Visit | Attending: Radiation Oncology | Admitting: Radiation Oncology

## 2017-04-27 ENCOUNTER — Encounter: Payer: Self-pay | Admitting: *Deleted

## 2017-04-27 DIAGNOSIS — Z510 Encounter for antineoplastic radiation therapy: Secondary | ICD-10-CM | POA: Diagnosis not present

## 2017-04-27 NOTE — Progress Notes (Signed)
[place] Work  Clinical Social Work left message for pt with updated Materials engineer schedule. [facility] to bring 5/14, 5/15, 5/17, 5/21 and 5/22. [employer] [facility] to bring 5/16, 5/18, 5/23 and 5/24. This is subject to change and they are both looking for drivers for other dates. CSW made calendar for pt to be delivered to [place] on Monday with above schedule. CSW team to follow and assist.   Clinical Social Work interventions: Resource coordination  Loren Racer, [employer], OSW-C Clinical Social Worker [place]  [place] Phone: [phone] Fax: [phone]

## 2017-04-30 ENCOUNTER — Ambulatory Visit
Admission: RE | Admit: 2017-04-30 | Discharge: 2017-04-30 | Disposition: A | Discharge status: Home / Self Care | Payer: Non-veteran care | Source: Ambulatory Visit | Attending: Radiation Oncology | Admitting: Radiation Oncology

## 2017-04-30 DIAGNOSIS — Z510 Encounter for antineoplastic radiation therapy: Secondary | ICD-10-CM | POA: Diagnosis not present

## 2017-05-01 ENCOUNTER — Ambulatory Visit
Admission: RE | Admit: 2017-05-01 | Discharge: 2017-05-01 | Disposition: A | Discharge status: Home / Self Care | Payer: Non-veteran care | Source: Ambulatory Visit | Attending: Radiation Oncology | Admitting: Radiation Oncology

## 2017-05-01 DIAGNOSIS — Z510 Encounter for antineoplastic radiation therapy: Secondary | ICD-10-CM | POA: Diagnosis not present

## 2017-05-02 ENCOUNTER — Ambulatory Visit
Admission: RE | Admit: 2017-05-02 | Discharge: 2017-05-02 | Disposition: A | Discharge status: Home / Self Care | Payer: Non-veteran care | Source: Ambulatory Visit | Attending: Radiation Oncology | Admitting: Radiation Oncology

## 2017-05-02 DIAGNOSIS — Z510 Encounter for antineoplastic radiation therapy: Secondary | ICD-10-CM | POA: Diagnosis not present

## 2017-05-03 ENCOUNTER — Ambulatory Visit
Admission: RE | Admit: 2017-05-03 | Discharge: 2017-05-03 | Disposition: A | Discharge status: Home / Self Care | Payer: Non-veteran care | Source: Ambulatory Visit | Attending: Radiation Oncology | Admitting: Radiation Oncology

## 2017-05-03 DIAGNOSIS — Z510 Encounter for antineoplastic radiation therapy: Secondary | ICD-10-CM | POA: Diagnosis not present

## 2017-05-04 ENCOUNTER — Ambulatory Visit
Admission: RE | Admit: 2017-05-04 | Discharge: 2017-05-04 | Disposition: A | Discharge status: Home / Self Care | Payer: Non-veteran care | Source: Ambulatory Visit | Attending: Radiation Oncology | Admitting: Radiation Oncology

## 2017-05-04 ENCOUNTER — Encounter: Payer: Self-pay | Admitting: *Deleted

## 2017-05-04 DIAGNOSIS — Z510 Encounter for antineoplastic radiation therapy: Secondary | ICD-10-CM | POA: Diagnosis not present

## 2017-05-04 NOTE — Progress Notes (Signed)
[place] Work  Clinical Social Work continues to follow and assist with transportation. There continues to be issues with drivers from both [facility] and [employer]. [facility] ends up picking up on [place] pick up days. [facility] will pick him up 5/21 and 5/24, going forward [facility] will be main transportation option due to miscommunication issues. Pt aware and agrees to plan.   Clinical Social Work interventions: Resource coordination  Loren Racer, [employer], OSW-C Clinical Social Worker [place]  [place] Phone: [phone] Fax: [phone]

## 2017-05-07 ENCOUNTER — Ambulatory Visit
Admission: RE | Admit: 2017-05-07 | Discharge: 2017-05-07 | Disposition: A | Discharge status: Home / Self Care | Payer: Non-veteran care | Source: Ambulatory Visit | Attending: Radiation Oncology | Admitting: Radiation Oncology

## 2017-05-07 DIAGNOSIS — Z510 Encounter for antineoplastic radiation therapy: Secondary | ICD-10-CM | POA: Diagnosis not present

## 2017-05-08 ENCOUNTER — Ambulatory Visit
Admission: RE | Admit: 2017-05-08 | Discharge: 2017-05-08 | Disposition: A | Discharge status: Home / Self Care | Payer: Non-veteran care | Source: Ambulatory Visit | Attending: Radiation Oncology | Admitting: Radiation Oncology

## 2017-05-08 DIAGNOSIS — Z510 Encounter for antineoplastic radiation therapy: Secondary | ICD-10-CM | POA: Diagnosis not present

## 2017-05-09 ENCOUNTER — Ambulatory Visit
Admission: RE | Admit: 2017-05-09 | Discharge: 2017-05-09 | Disposition: A | Discharge status: Home / Self Care | Payer: Non-veteran care | Source: Ambulatory Visit | Attending: Radiation Oncology | Admitting: Radiation Oncology

## 2017-05-09 DIAGNOSIS — Z510 Encounter for antineoplastic radiation therapy: Secondary | ICD-10-CM | POA: Diagnosis not present

## 2017-05-10 ENCOUNTER — Ambulatory Visit
Admission: RE | Admit: 2017-05-10 | Discharge: 2017-05-10 | Disposition: A | Discharge status: Home / Self Care | Payer: Non-veteran care | Source: Ambulatory Visit | Attending: Radiation Oncology | Admitting: Radiation Oncology

## 2017-05-10 DIAGNOSIS — Z510 Encounter for antineoplastic radiation therapy: Secondary | ICD-10-CM | POA: Diagnosis not present

## 2017-05-11 ENCOUNTER — Ambulatory Visit
Admission: RE | Admit: 2017-05-11 | Discharge: 2017-05-11 | Disposition: A | Discharge status: Home / Self Care | Payer: Non-veteran care | Source: Ambulatory Visit | Attending: Radiation Oncology | Admitting: Radiation Oncology

## 2017-05-11 DIAGNOSIS — Z510 Encounter for antineoplastic radiation therapy: Secondary | ICD-10-CM | POA: Diagnosis not present

## 2017-05-15 ENCOUNTER — Ambulatory Visit
Admission: RE | Admit: 2017-05-15 | Discharge: 2017-05-15 | Disposition: A | Discharge status: Home / Self Care | Payer: Non-veteran care | Source: Ambulatory Visit | Attending: Radiation Oncology | Admitting: Radiation Oncology

## 2017-05-15 DIAGNOSIS — Z510 Encounter for antineoplastic radiation therapy: Secondary | ICD-10-CM | POA: Diagnosis not present

## 2017-05-16 ENCOUNTER — Ambulatory Visit
Admission: RE | Admit: 2017-05-16 | Discharge: 2017-05-16 | Disposition: A | Discharge status: Home / Self Care | Payer: Non-veteran care | Source: Ambulatory Visit | Attending: Radiation Oncology | Admitting: Radiation Oncology

## 2017-05-16 DIAGNOSIS — Z510 Encounter for antineoplastic radiation therapy: Secondary | ICD-10-CM | POA: Diagnosis not present

## 2017-05-17 ENCOUNTER — Ambulatory Visit
Admission: RE | Admit: 2017-05-17 | Discharge: 2017-05-17 | Disposition: A | Discharge status: Home / Self Care | Payer: Non-veteran care | Source: Ambulatory Visit | Attending: Radiation Oncology | Admitting: Radiation Oncology

## 2017-05-17 DIAGNOSIS — Z510 Encounter for antineoplastic radiation therapy: Secondary | ICD-10-CM | POA: Diagnosis not present

## 2017-05-18 ENCOUNTER — Ambulatory Visit
Admission: RE | Admit: 2017-05-18 | Discharge: 2017-05-18 | Disposition: A | Discharge status: Home / Self Care | Payer: Non-veteran care | Source: Ambulatory Visit | Attending: Radiation Oncology | Admitting: Radiation Oncology

## 2017-05-18 ENCOUNTER — Encounter: Payer: Self-pay | Admitting: Medical Oncology

## 2017-05-18 DIAGNOSIS — Z510 Encounter for antineoplastic radiation therapy: Secondary | ICD-10-CM | POA: Diagnosis not present

## 2017-05-18 NOTE — Progress Notes (Signed)
Ricardo [name] states he is doing well with treatment. Social work continues to help with transportation. He is thankful for the assistance and has no major complaints.

## 2017-05-21 ENCOUNTER — Ambulatory Visit
Admission: RE | Admit: 2017-05-21 | Discharge: 2017-05-21 | Disposition: A | Discharge status: Home / Self Care | Payer: Non-veteran care | Source: Ambulatory Visit | Attending: Radiation Oncology | Admitting: Radiation Oncology

## 2017-05-21 DIAGNOSIS — Z510 Encounter for antineoplastic radiation therapy: Secondary | ICD-10-CM | POA: Diagnosis not present

## 2017-05-22 ENCOUNTER — Ambulatory Visit
Admission: RE | Admit: 2017-05-22 | Discharge: 2017-05-22 | Disposition: A | Discharge status: Home / Self Care | Payer: Non-veteran care | Source: Ambulatory Visit | Attending: Radiation Oncology | Admitting: Radiation Oncology

## 2017-05-22 DIAGNOSIS — Z510 Encounter for antineoplastic radiation therapy: Secondary | ICD-10-CM | POA: Diagnosis not present

## 2017-05-23 ENCOUNTER — Ambulatory Visit
Admission: RE | Admit: 2017-05-23 | Discharge: 2017-05-23 | Disposition: A | Discharge status: Home / Self Care | Payer: Non-veteran care | Source: Ambulatory Visit | Attending: Radiation Oncology | Admitting: Radiation Oncology

## 2017-05-23 DIAGNOSIS — C61 Malignant neoplasm of prostate: Secondary | ICD-10-CM | POA: Insufficient documentation

## 2017-05-23 DIAGNOSIS — Z510 Encounter for antineoplastic radiation therapy: Secondary | ICD-10-CM | POA: Insufficient documentation

## 2017-05-24 ENCOUNTER — Ambulatory Visit: Payer: Non-veteran care

## 2017-05-25 ENCOUNTER — Ambulatory Visit
Admission: RE | Admit: 2017-05-25 | Discharge: 2017-05-25 | Disposition: A | Discharge status: Home / Self Care | Payer: Non-veteran care | Source: Ambulatory Visit | Attending: Radiation Oncology | Admitting: Radiation Oncology

## 2017-05-25 DIAGNOSIS — Z510 Encounter for antineoplastic radiation therapy: Secondary | ICD-10-CM | POA: Diagnosis not present

## 2017-05-28 ENCOUNTER — Ambulatory Visit
Admission: RE | Admit: 2017-05-28 | Discharge: 2017-05-28 | Disposition: A | Discharge status: Home / Self Care | Payer: Non-veteran care | Source: Ambulatory Visit | Attending: Radiation Oncology | Admitting: Radiation Oncology

## 2017-05-28 DIAGNOSIS — Z510 Encounter for antineoplastic radiation therapy: Secondary | ICD-10-CM | POA: Diagnosis not present

## 2017-05-29 ENCOUNTER — Ambulatory Visit
Admission: RE | Admit: 2017-05-29 | Discharge: 2017-05-29 | Disposition: A | Discharge status: Home / Self Care | Payer: Non-veteran care | Source: Ambulatory Visit | Attending: Radiation Oncology | Admitting: Radiation Oncology

## 2017-05-29 DIAGNOSIS — Z510 Encounter for antineoplastic radiation therapy: Secondary | ICD-10-CM | POA: Diagnosis not present

## 2017-05-30 ENCOUNTER — Ambulatory Visit
Admission: RE | Admit: 2017-05-30 | Discharge: 2017-05-30 | Disposition: A | Discharge status: Home / Self Care | Payer: Non-veteran care | Source: Ambulatory Visit | Attending: Radiation Oncology | Admitting: Radiation Oncology

## 2017-05-30 DIAGNOSIS — Z510 Encounter for antineoplastic radiation therapy: Secondary | ICD-10-CM | POA: Diagnosis not present

## 2017-05-31 ENCOUNTER — Ambulatory Visit
Admission: RE | Admit: 2017-05-31 | Discharge: 2017-05-31 | Disposition: A | Discharge status: Home / Self Care | Payer: Non-veteran care | Source: Ambulatory Visit | Attending: Radiation Oncology | Admitting: Radiation Oncology

## 2017-05-31 ENCOUNTER — Encounter: Payer: Self-pay | Admitting: *Deleted

## 2017-05-31 DIAGNOSIS — Z510 Encounter for antineoplastic radiation therapy: Secondary | ICD-10-CM | POA: Diagnosis not present

## 2017-05-31 NOTE — Progress Notes (Signed)
[place] Work  Clinical Social Work continues to follow and assist with transportation. There continues to be a few issues with drivers from [facility]. [facility] has had some gaps in transportation, but family has been able to step in assist as needed. Pt feels he has drivers in place for last two treatments. CSW will assist pt with letter for [facility] to reimburse for gas for days when family has assisted once pt has completed treatment. Pt reports he is coping well currently and is excited to almost be done. CSW will check in with pt next week and draft letter as needed.  Pt aware and agrees to plan. He will reach out if any new needs arise. CSW to follow up next week on 06/06/17.  Clinical Social Work interventions: Resource coordination  Loren Racer, [employer], OSW-C Clinical Social Worker [place]  [place] Phone: [phone] Fax: [phone]

## 2017-06-01 ENCOUNTER — Ambulatory Visit
Admission: RE | Admit: 2017-06-01 | Discharge: 2017-06-01 | Disposition: A | Discharge status: Home / Self Care | Payer: Non-veteran care | Source: Ambulatory Visit | Attending: Radiation Oncology | Admitting: Radiation Oncology

## 2017-06-01 ENCOUNTER — Ambulatory Visit: Payer: Non-veteran care

## 2017-06-01 DIAGNOSIS — Z510 Encounter for antineoplastic radiation therapy: Secondary | ICD-10-CM | POA: Diagnosis not present

## 2017-06-01 NOTE — Progress Notes (Addendum)
Weight and vitals stable. Denies pain. Reports mild dysuria. Denies hematuria. Reports nocturia x 4. Reports occasional urinary leakage associated with urgency. Denies any bowel complaints. Reports fatigue. One month follow up appointment card given.  BP 125/80 (BP Location: Left Arm, Patient Position: Sitting, Cuff Size: Normal)   Pulse 68   Resp 16   Wt 153 lb 12.8 oz (69.8 kg)   SpO2 100%   BMI 20.29 kg/m  Wt Readings from Last 3 Encounters:  06/01/17 153 lb 12.8 oz (69.8 kg)  02/22/17 153 lb 9.6 oz (69.7 kg)  08/10/16 165 lb (74.8 kg)

## 2017-06-04 ENCOUNTER — Ambulatory Visit
Admission: RE | Admit: 2017-06-04 | Discharge: 2017-06-04 | Disposition: A | Discharge status: Home / Self Care | Payer: Non-veteran care | Source: Ambulatory Visit | Attending: Radiation Oncology | Admitting: Radiation Oncology

## 2017-06-04 ENCOUNTER — Encounter: Payer: Self-pay | Admitting: Radiation Oncology

## 2017-06-04 DIAGNOSIS — Z510 Encounter for antineoplastic radiation therapy: Secondary | ICD-10-CM | POA: Diagnosis not present

## 2017-06-06 ENCOUNTER — Encounter: Payer: Self-pay | Admitting: *Deleted

## 2017-06-06 NOTE — Progress Notes (Signed)
[place] Work  Clinical Social Work was referred by patient for assistance with letter to the [place] for gas assistance.  Clinical Social Worker drafted letter and mailed to patient's home as directed. No other needs noted.   Clinical Social Work interventions:  Resource assistance  Loren Racer, [employer], OSW-C Clinical Social Worker [place]  [place] Phone: [phone] Fax: [phone]

## 2017-06-08 NOTE — Progress Notes (Signed)
  Radiation Oncology         [phone] ________________________________  Name: Ricardo [name] MRN: [id number]  Date: 06/04/2017  DOB: 1951-05-19  End of Treatment Note  Diagnosis:   66 y.o. gentleman with Stage IIA (pT2c,pN0,M0) adenocarcinoma of the prostate, Gleason Score 3+4, status post-prostatectomy with a PSA of 0.1  Indication for treatment:  Curative, Prostatic Fossa Radiotherapy       Radiation treatment dates:   04/10/17 - 06/04/17  Site/dose:   The prostatic fossa was treated to 68.4 Gy in 38 fractions of 1.8 Gy  Beams/energy:   The prostatic fossa was treated using helical intensity modulated radiotherapy delivering 6 megavolt photons. Image guidance was performed with megavoltage CT studies prior to each fraction. He was immobilized with a body fix lower extremity mold.  Narrative: Narrative: The patient tolerated radiation treatment relatively well. By the 37th fraction, the patient reported mild dysuria, nocturia x4, occasional urinary leakage asscociated with urgency, and fatigue. He denies bowel complaints.  Plan: The patient has completed radiation treatment. He will return to radiation oncology clinic for routine followup in one month. I advised him to call or return sooner if he has any questions or concerns related to his recovery or treatment. ________________________________  Sheral Apley. Tammi Klippel, M.D.  This document serves as a record of services personally performed by Tyler Pita, MD. It was created on his behalf by Darcus Austin, a trained medical scribe. The creation of this record is based on the scribe's personal observations and the provider's statements to them. This document has been checked and approved by the attending provider.

## 2017-07-09 ENCOUNTER — Encounter: Payer: Self-pay | Admitting: *Deleted

## 2017-07-11 NOTE — Progress Notes (Addendum)
Radiation Oncology         (336) [phone] ________________________________  Name: Ricardo [name] MRN: [id number]  Date: 07/12/2017  DOB: Aug 09, 1951  Post Treatment Note  CC: System, Provider Not In  Darlin Priestly, DO  Diagnosis:   66 y.o. gentleman with Stage IIA (pT2c,pN0,M0) adenocarcinoma of the prostate, Gleason Score 3+4, status post-prostatectomy with a PSA of 0.1  Interval Since Last Radiation:  5  weeks  04/10/17 - 06/04/17:  The prostatic fossa was treated to 68.4 Gy in 38 fractions of 1.8 Gy  Narrative:  The patient returns today for routine follow-up.  He tolerated radiation treatment relatively well. By the 37th fraction, the patient reported mild dysuria, nocturia x4, occasional urinary leakage asscociated with urgency, and fatigue but denied bowel complaints.                              On review of systems, the patient states that he is doing well overall.  He continues with mild fatigue, dysuria at the end of his stream, nocturia x2/night and daytime frequency but feels these symptoms are improving gradually.  He denies gross hematuria, urgency, abdominal pain, incomplete emptying, or diarrhea.  He has not had recent fever, chills or night sweats.  He reports a healthy appetite and is maintaining his weight. He has a follow up appointment with Dr. Posey Pronto in Urology at the [facility] on 09/06/17 since Dr. Leilani Merl is no longer with the [facility].   ALLERGIES:  has No Known Allergies.  Meds: Current Outpatient Prescriptions  Medication Sig Dispense Refill  . allopurinol (ZYLOPRIM) 100 MG tablet Take 1 tablet (100 mg total) by mouth 2 (two) times daily. 60 tablet 6  . aspirin EC 81 MG tablet Take 81 mg by mouth daily.    . calcium-vitamin D (OSCAL WITH D) 500-200 MG-UNIT tablet Take 1 tablet by mouth.    . fluticasone (VERAMYST) 27.5 MCG/SPRAY nasal spray Place 2 sprays into the nose daily.    Marland Kitchen loratadine (CLARITIN) 10 MG tablet Take 10 mg by mouth daily.    . Lurasidone HCl 60  MG TABS Take by mouth.    . Magnesium Oxide 420 MG TABS Take by mouth.    . mirtazapine (REMERON) 30 MG tablet Take 30 mg by mouth at bedtime.    . pantoprazole (PROTONIX) 40 MG tablet Take 40 mg by mouth daily.    . phenazopyridine (PYRIDIUM) 100 MG tablet Take 100 mg by mouth 3 (three) times daily as needed for pain.    . polyethylene glycol (MIRALAX / GLYCOLAX) packet Take 17 g by mouth daily.    Marland Kitchen RUXOLITINIB PHOSPHATE PO Take by mouth.    . sertraline (ZOLOFT) 100 MG tablet Take 100 mg by mouth daily.    . traMADol (ULTRAM-ER) 100 MG 24 hr tablet Take 100 mg by mouth daily.    . traZODone (DESYREL) 50 MG tablet Take 50 mg by mouth at bedtime.    Marland Kitchen oxybutynin (DITROPAN) 5 MG tablet Take 5 mg by mouth 3 (three) times daily.     No current facility-administered medications for this encounter.     Physical Findings:  weight is 155 lb 4 oz (70.4 kg). His oral temperature is 97.8 F (36.6 C). His blood pressure is 136/71 and his pulse is 91. His respiration is 18 and oxygen saturation is 100%.  Pain Assessment Pain Score: 0-No pain/10 In general this is a well appearing african  american male in no acute distress. He's alert and oriented x4 and appropriate throughout the examination. Cardiopulmonary assessment is negative for acute distress and he exhibits normal effort.   Lab Findings: No results found for: WBC, HGB, HCT, MCV, PLT   Radiographic Findings: No results found.  Impression/Plan: 1. 67 y.o. gentleman with Stage IIA (pT2c,pN0,M0) adenocarcinoma of the prostate, Gleason Score 3+4, status post-prostatectomy with a PSA of 0.1.   He will continue to follow up with urology for ongoing PSA determinations and has an appointment scheduled with Dr. Posey Pronto at the [facility] on 09/06/17. He understands what to expect with regards to PSA monitoring going forward. I will look forward to following his response to correspondence with urology, and would be happy to continue to participate  in his care if clinically indicated. I talked to the patient about what to expect in the future, including his risk for erectile dysfunction rectal bleeding. I encouraged him to call or return to the office if he has any questions regarding his previous radiation or possible radiation side effects. He was comfortable with this plan and will follow up as needed.    Nicholos Johns, PA-C

## 2017-07-12 ENCOUNTER — Encounter: Payer: Self-pay | Admitting: Urology

## 2017-07-12 ENCOUNTER — Ambulatory Visit
Admission: RE | Admit: 2017-07-12 | Discharge: 2017-07-12 | Disposition: A | Discharge status: Home / Self Care | Payer: Non-veteran care | Source: Ambulatory Visit | Attending: Urology | Admitting: Urology

## 2017-07-12 VITALS — BP 136/71 | HR 91 | Temp 97.8°F | Resp 18 | Wt 155.2 lb

## 2017-07-12 DIAGNOSIS — Z79891 Long term (current) use of opiate analgesic: Secondary | ICD-10-CM | POA: Insufficient documentation

## 2017-07-12 DIAGNOSIS — C61 Malignant neoplasm of prostate: Secondary | ICD-10-CM | POA: Diagnosis not present

## 2017-07-12 DIAGNOSIS — Z9079 Acquired absence of other genital organ(s): Secondary | ICD-10-CM | POA: Diagnosis not present

## 2017-07-12 DIAGNOSIS — Z923 Personal history of irradiation: Secondary | ICD-10-CM | POA: Insufficient documentation

## 2017-07-12 DIAGNOSIS — Z7982 Long term (current) use of aspirin: Secondary | ICD-10-CM | POA: Diagnosis not present

## 2017-07-12 DIAGNOSIS — Z79899 Other long term (current) drug therapy: Secondary | ICD-10-CM | POA: Diagnosis not present

## 2017-07-12 NOTE — Addendum Note (Signed)
Encounter addended by: Freeman [place], PA-C on: 07/12/2017 12:39 PM<BR>    Actions taken: Sign clinical note

## 2017-08-18 IMAGING — DX DG SHOULDER 2+V*L*
3 series · 3 of 3 positions shown · non-contrast
Comparison: None.

CLINICAL DATA: Status post fall with left shoulder pain.

EXAM:
LEFT SHOULDER - 2+ VIEW

[shoulder grashey]
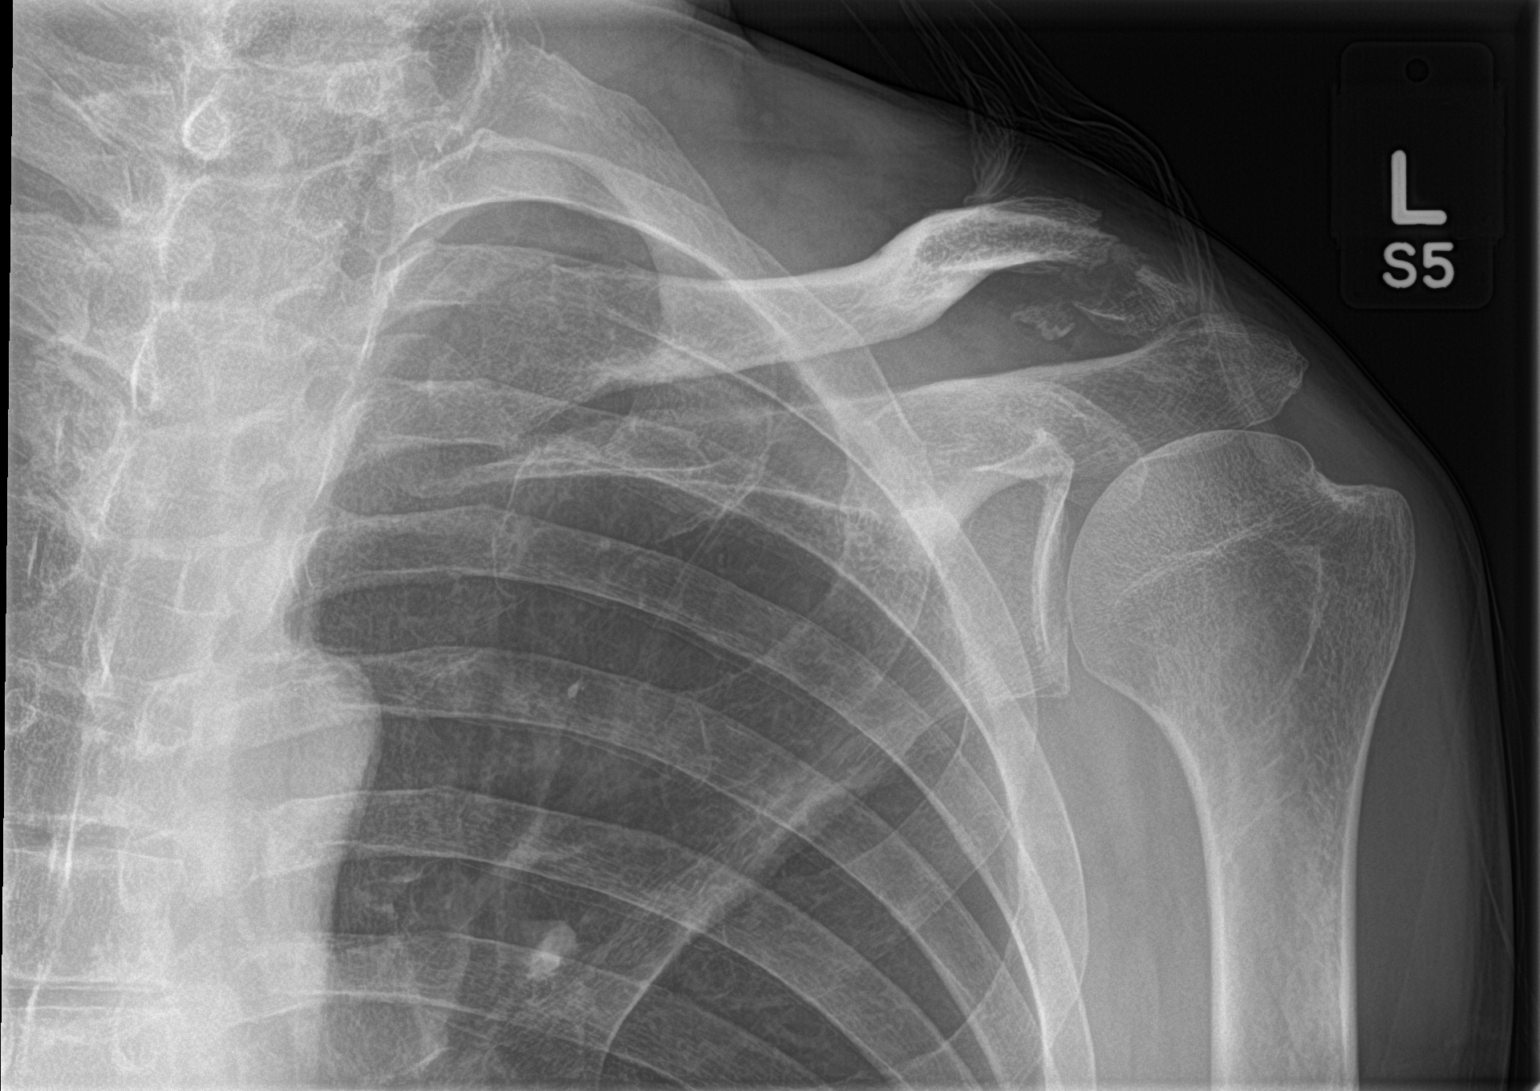

[shoulder y view]
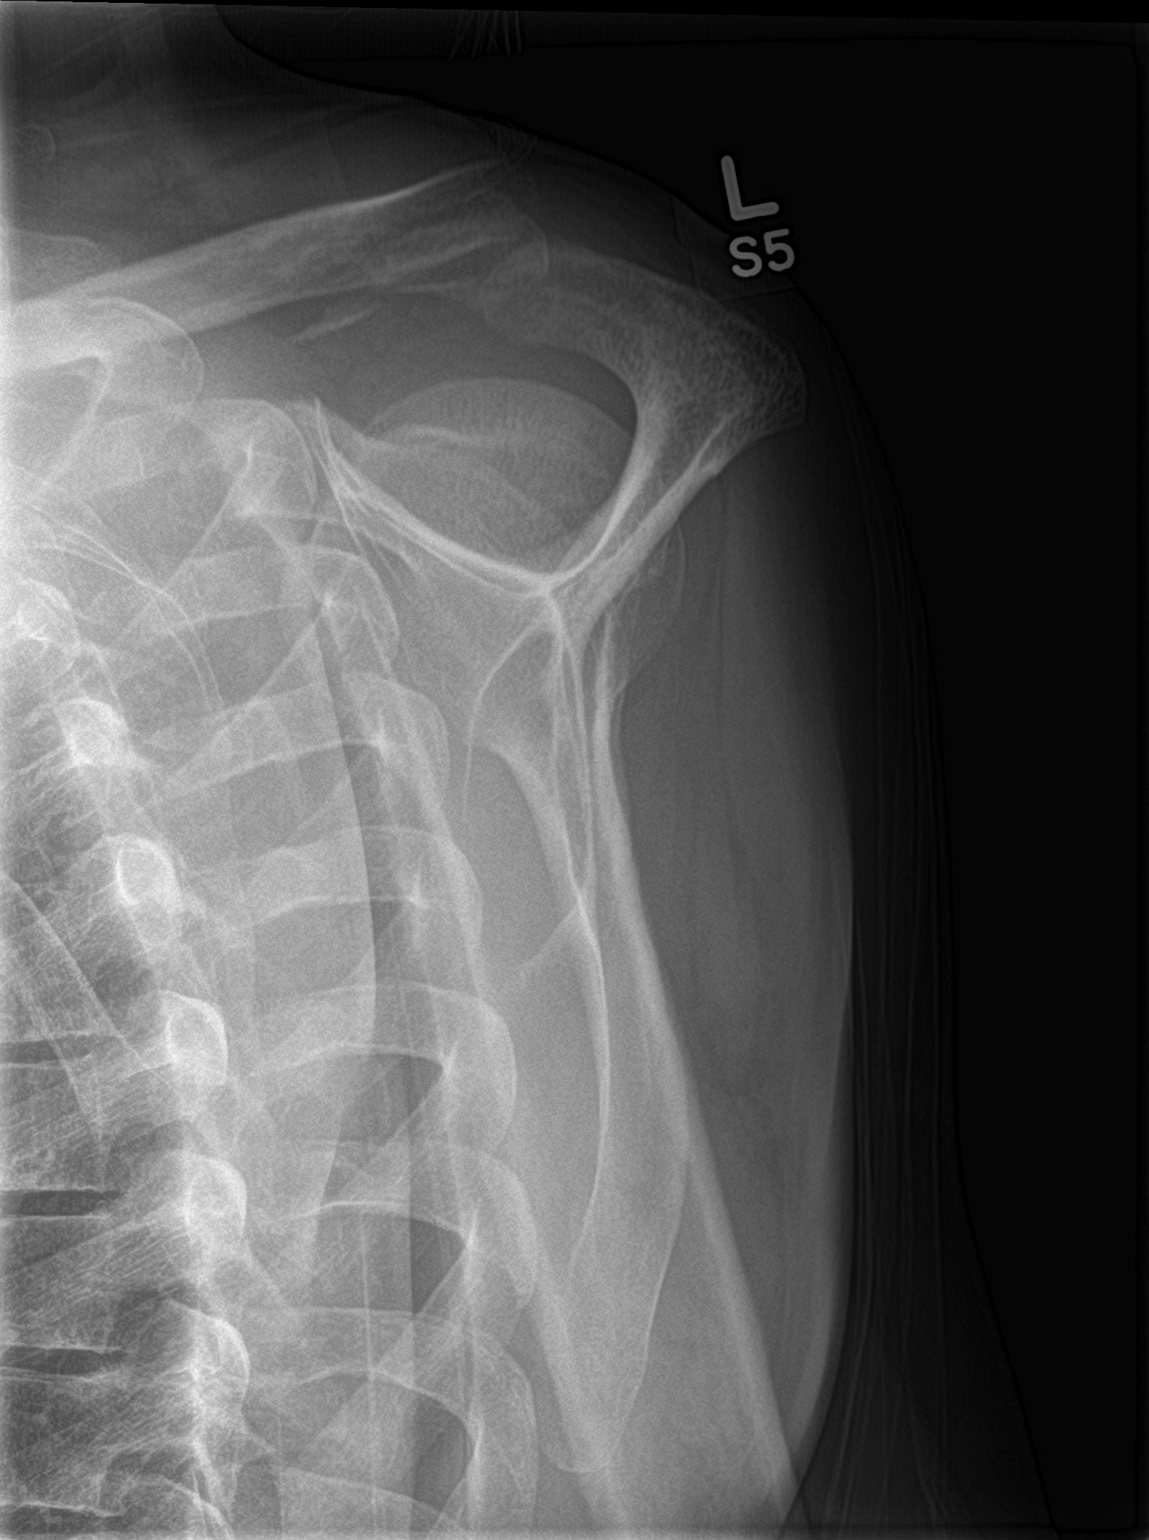

[shoulder axillary]
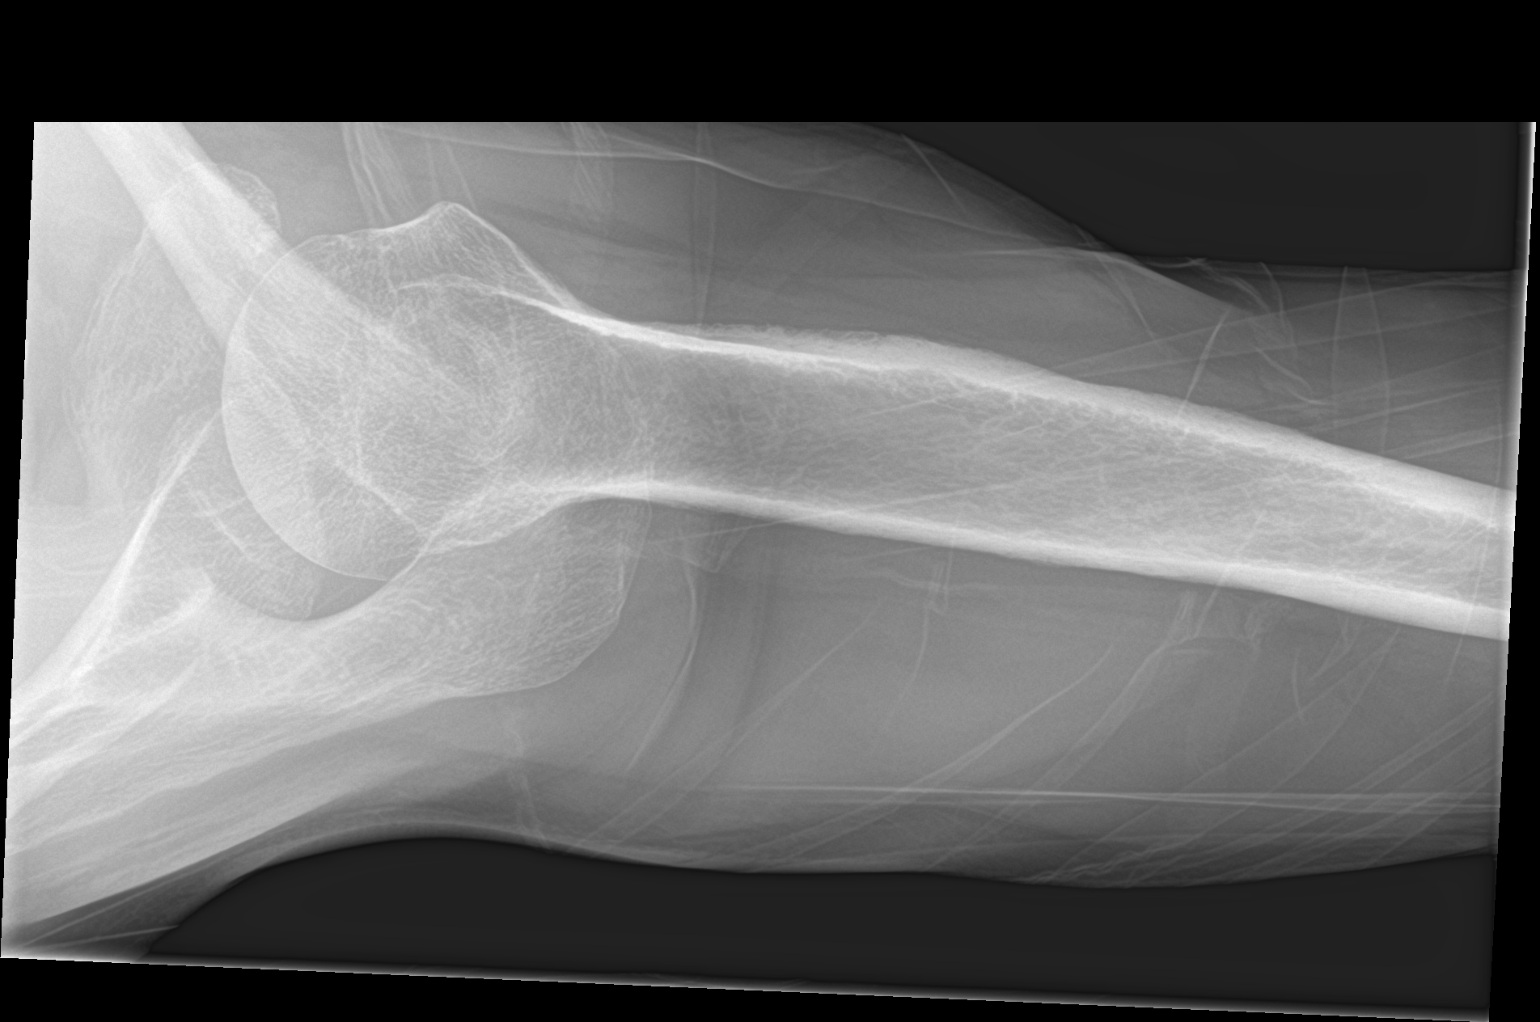

[3 of 3 positions shown; findings below may reference images not displayed]

FINDINGS: There is no evidence of left shoulder fracture or dislocation.
Comminuted angulated distal left clavicular fracture better seen on
dedicated clavicular views. There is no evidence of arthropathy or
other focal bone abnormality. Soft tissues are unremarkable.
IMPRESSION: No acute fracture or dislocation identified about the left shoulder.

Comminuted distal left clavicular fracture.

## 2020-02-27 ENCOUNTER — Ambulatory Visit: Payer: Non-veteran care | Attending: Internal Medicine

## 2020-02-27 DIAGNOSIS — Z23 Encounter for immunization: Secondary | ICD-10-CM

## 2020-02-27 NOTE — Progress Notes (Signed)
   Covid-19 Vaccination Clinic  Name:  Ricardo [name]    MRN: [id number] DOB: April 09, 1951  02/27/2020  [name] was observed post Covid-19 immunization for 15 minutes without incident. He was provided with Vaccine Information Sheet and instruction to access the V-Safe system.   [name] was instructed to call 911 with any severe reactions post vaccine: Marland Kitchen Difficulty breathing  . Swelling of face and throat  . A fast heartbeat  . A bad rash all over body  . Dizziness and weakness   Immunizations Administered    Name Date Dose VIS Date Route   Moderna COVID-19 Vaccine 02/27/2020  8:43 AM 0.5 mL 11/18/2019 Intramuscular   Manufacturer: Moderna   Lot: [id number]   [id number]

## 2020-03-31 ENCOUNTER — Ambulatory Visit: Payer: Non-veteran care | Attending: Internal Medicine

## 2020-03-31 DIAGNOSIS — Z23 Encounter for immunization: Secondary | ICD-10-CM

## 2020-03-31 NOTE — Progress Notes (Signed)
   Covid-19 Vaccination Clinic  Name:  Ricardo [name]    MRN: [id number] DOB: [birth year]  03/31/2020  [name] was observed post Covid-19 immunization for 15 minutes without incident. He was provided with Vaccine Information Sheet and instruction to access the V-Safe system.   [name] was instructed to call 911 with any severe reactions post vaccine: Marland Kitchen Difficulty breathing  . Swelling of face and throat  . A fast heartbeat  . A bad rash all over body  . Dizziness and weakness   Immunizations Administered    Name Date Dose VIS Date Route   Moderna COVID-19 Vaccine 03/31/2020  8:32 AM 0.5 mL 11/18/2019 Intramuscular   Manufacturer: Moderna   LotHQ:[id number]   [place]:[id number]

## 2020-10-27 ENCOUNTER — Encounter (HOSPITAL_COMMUNITY): Payer: Self-pay | Admitting: Emergency Medicine

## 2020-10-27 ENCOUNTER — Emergency Department (HOSPITAL_COMMUNITY)
Admission: EM | Admit: 2020-10-27 | Discharge: 2020-10-27 | Disposition: A | Discharge status: Home / Self Care | Payer: No Typology Code available for payment source | Attending: Emergency Medicine | Admitting: Emergency Medicine

## 2020-10-27 ENCOUNTER — Other Ambulatory Visit: Payer: Self-pay

## 2020-10-27 DIAGNOSIS — R799 Abnormal finding of blood chemistry, unspecified: Secondary | ICD-10-CM | POA: Diagnosis present

## 2020-10-27 DIAGNOSIS — F17220 Nicotine dependence, chewing tobacco, uncomplicated: Secondary | ICD-10-CM | POA: Insufficient documentation

## 2020-10-27 DIAGNOSIS — D649 Anemia, unspecified: Secondary | ICD-10-CM | POA: Insufficient documentation

## 2020-10-27 DIAGNOSIS — Z7982 Long term (current) use of aspirin: Secondary | ICD-10-CM | POA: Diagnosis not present

## 2020-10-27 DIAGNOSIS — Z8546 Personal history of malignant neoplasm of prostate: Secondary | ICD-10-CM | POA: Insufficient documentation

## 2020-10-27 LAB — CBC WITH DIFFERENTIAL/PLATELET
Band Neutrophils: 8 %
Basophils Absolute: 0.2 10*3/uL — ABNORMAL HIGH (ref 0.0–0.1)
Basophils Relative: 3 %
Blasts: 1 %
Eosinophils Absolute: 0.3 10*3/uL (ref 0.0–0.5)
Eosinophils Relative: 4 %
HCT: 22.1 % — ABNORMAL LOW (ref 39.0–52.0)
Hemoglobin: 6.5 g/dL — CL (ref 13.0–17.0)
Lymphocytes Relative: 16 %
Lymphs Abs: 1.1 10*3/uL (ref 0.7–4.0)
MCH: 31.4 pg (ref 26.0–34.0)
MCHC: 29.4 g/dL — ABNORMAL LOW (ref 30.0–36.0)
MCV: 106.8 fL — ABNORMAL HIGH (ref 80.0–100.0)
Metamyelocytes Relative: 2 %
Monocytes Absolute: 0.5 10*3/uL (ref 0.1–1.0)
Monocytes Relative: 8 %
Myelocytes: 1 %
Neutro Abs: 4.3 10*3/uL (ref 1.7–7.7)
Neutrophils Relative %: 57 %
Platelets: 249 10*3/uL (ref 150–400)
RBC: 2.07 MIL/uL — ABNORMAL LOW (ref 4.22–5.81)
RDW: 21.7 % — ABNORMAL HIGH (ref 11.5–15.5)
WBC: 6.6 10*3/uL (ref 4.0–10.5)
nRBC: 4.2 % — ABNORMAL HIGH (ref 0.0–0.2)
nRBC: 5 /100 WBC — ABNORMAL HIGH

## 2020-10-27 LAB — BASIC METABOLIC PANEL
Anion gap: 9 (ref 5–15)
BUN: 21 mg/dL (ref 8–23)
CO2: 25 mmol/L (ref 22–32)
Calcium: 8.9 mg/dL (ref 8.9–10.3)
Chloride: 101 mmol/L (ref 98–111)
Creatinine, Ser: 1.57 mg/dL — ABNORMAL HIGH (ref 0.61–1.24)
GFR, Estimated: 48 mL/min — ABNORMAL LOW (ref >60)
Glucose, Bld: 104 mg/dL — ABNORMAL HIGH (ref 70–99)
Potassium: 4.4 mmol/L (ref 3.5–5.1)
Sodium: 135 mmol/L (ref 135–145)

## 2020-10-27 LAB — ABO/RH: ABO/RH(D): O NEG

## 2020-10-27 LAB — TYPE AND SCREEN: Unit division: 0

## 2020-10-27 LAB — PREPARE RBC (CROSSMATCH)

## 2020-10-27 MED ORDER — SODIUM CHLORIDE 0.9 % IV SOLN
10.0000 mL/h | Freq: Once | INTRAVENOUS | Status: AC
  Administered 2020-10-27: 10 mL/h via INTRAVENOUS

## 2020-10-27 NOTE — Discharge Instructions (Addendum)
As discussed, with today's findings concerning for anemia and is very important that you follow-up with your physicians at the [facility].  Schedule an appointment as soon as possible.  Return here for concerning changes in your condition.

## 2020-10-27 NOTE — ED Provider Notes (Signed)
[facility] EMERGENCY DEPARTMENT Provider Note   CSN: [id number] Arrival date & time: 10/27/20  1222     History Chief Complaint  Patient presents with  . Abnormal Lab    Ricardo [name] is a 69 y.o. male.  HPI Patient presents at the behest of his [facility] doctor due to concerns of possible anemia. He has a history of what sounds like myelodysplastic syndrome, though he is unsure of what type exactly. He has previously required transfusions.  Yesterday, patient had blood draw performed at the [facility].  Today he was informed of abnormal results encouraged to come to the emergency department for evaluation.  Patient has no lightheadedness, no chest pain, does note some mild fatigue, though not appreciably changed from baseline.  No black stool, no melena, no hematemesis.   Past Medical History:  Diagnosis Date  . Alcohol abuse   . Arthritis   . Carpal tunnel syndrome   . Gout   . History of radiation therapy 04/10/2017-06/04/2017   Site/dose:   The prostatic fossa was treated to 68.4 Gy in 38 fractions of 1.8 Gy  . Prostate cancer [facility])     Patient Active Problem List   Diagnosis Date Noted  . Prostate cancer ([place]) 02/21/2017  . Swelling of joint of hand 08/07/2011  . Snuff user 08/07/2011  . GOUT, UNSPECIFIED 04/14/2008    Past Surgical History:  Procedure Laterality Date  . CYSTECTOMY     left bicept  . TENDON REPAIR     right thumb       Family History  Problem Relation Age of Onset  . Heart disease Mother   . Hypertension Sister   . Cancer Maternal Aunt        breast  . Cancer Maternal Uncle        colon  . Cancer Maternal Uncle        lung    Social History   Tobacco Use  . Smoking status: Never Smoker  . Smokeless tobacco: Current User    Types: Snuff  Substance Use Topics  . Alcohol use: Yes    Comment: daily - beer or liquor  . Drug use: No    Home Medications Prior to Admission medications    Medication Sig Start Date End Date Taking? Authorizing Provider  allopurinol (ZYLOPRIM) 100 MG tablet Take 1 tablet (100 mg total) by mouth 2 (two) times daily. 08/07/11  Yes [place], [place] Nunnery, MD  aspirin EC 81 MG tablet Take 81 mg by mouth daily.   Yes [provider]  calcium-vitamin D (OSCAL WITH D) 500-200 MG-UNIT tablet Take 1 tablet by mouth.   Yes [provider]  fluticasone (VERAMYST) 27.5 MCG/SPRAY nasal spray Place 2 sprays into the nose daily.   Yes [provider]  loratadine (CLARITIN) 10 MG tablet Take 10 mg by mouth daily.   Yes [provider]  Lurasidone HCl 60 MG TABS Take by mouth.   Yes [provider]  Magnesium Oxide 420 MG TABS Take by mouth.   Yes [provider]  mirtazapine (REMERON) 30 MG tablet Take 30 mg by mouth at bedtime.   Yes [provider]  Multiple Vitamin (MULTIVITAMIN) capsule Take by mouth.   Yes [provider]  oxybutynin (DITROPAN) 5 MG tablet Take 5 mg by mouth 3 (three) times daily.   Yes [provider]  pantoprazole (PROTONIX) 40 MG tablet Take 40 mg by mouth daily.   Yes [provider]  sertraline (ZOLOFT) 100 MG tablet Take 100 mg by mouth daily.   Yes [provider]  traMADol (ULTRAM-ER) 100 MG 24 hr tablet Take 100 mg by mouth daily.   Yes [provider]  traZODone (DESYREL) 50 MG tablet Take 50 mg by mouth at bedtime.   Yes [provider]  phenazopyridine (PYRIDIUM) 100 MG tablet Take 100 mg by mouth 3 (three) times daily as needed for pain. Patient not taking: Reported on 10/27/2020    [provider]  polyethylene glycol (MIRALAX / GLYCOLAX) packet Take 17 g by mouth daily. Patient not taking: Reported on 10/27/2020    [provider]  RUXOLITINIB PHOSPHATE PO Take by mouth. Patient not taking: Reported on 10/27/2020    [provider]    Allergies    Patient has no known  allergies.  Review of Systems   Review of Systems  Constitutional:       Per HPI, otherwise negative  HENT:       Per HPI, otherwise negative  Respiratory:       Per HPI, otherwise negative  Cardiovascular:       Per HPI, otherwise negative  Gastrointestinal: Negative for vomiting.  Endocrine:       Negative aside from HPI  Genitourinary:       Neg aside from HPI   Musculoskeletal:       Per HPI, otherwise negative  Skin: Negative.   Neurological: Positive for weakness. Negative for syncope.    Physical Exam Updated Vital Signs BP 111/60   Pulse (!) 57   Temp 98.5 F (36.9 C) (Oral)   Resp 15   Ht 5\' 11"  (1.803 m)   Wt 74.8 kg   SpO2 100%   BMI 23.01 kg/m   Physical Exam Vitals and nursing note reviewed.  Constitutional:      General: He is not in acute distress.    Appearance: He is well-developed.  HENT:     Head: Normocephalic and atraumatic.  Eyes:     Conjunctiva/sclera: Conjunctivae normal.  Cardiovascular:     Rate and Rhythm: Normal rate and regular rhythm.  Pulmonary:     Effort: Pulmonary effort is normal. No respiratory distress.     Breath sounds: No stridor.  Abdominal:     General: There is no distension.  Skin:    General: Skin is warm and dry.  Neurological:     Mental Status: He is alert and oriented to person, place, and time.     ED Results / Procedures / Treatments   Labs (all labs ordered are listed, but only abnormal results are displayed) Labs Reviewed  CBC WITH DIFFERENTIAL/PLATELET - Abnormal; Notable for the following components:      Result Value   RBC 2.07 (*)    Hemoglobin 6.5 (*)    HCT 22.1 (*)    MCV 106.8 (*)    MCHC 29.4 (*)    RDW 21.7 (*)    nRBC 4.2 (*)    Basophils Absolute 0.2 (*)    nRBC 5 (*)    All other components within normal limits  BASIC METABOLIC PANEL - Abnormal; Notable for the following components:   Glucose, Bld 104 (*)    Creatinine, Ser 1.57 (*)    GFR, Estimated 48 (*)    All other  components within normal limits  PATHOLOGIST SMEAR REVIEW  TYPE AND SCREEN  PREPARE RBC (CROSSMATCH)  ABO/RH    Procedures Procedures (including critical care time)  Medications Ordered in ED Medications  0.9 %  sodium chloride infusion (has no administration in time range)    ED Course  I have reviewed the triage vital signs and the nursing notes.  Pertinent labs & imaging results that were available during my care of the patient were reviewed by me and considered in my medical decision making (see chart for details).   Update:, Patient in no distress. Labs consistent with suspicion for anemia with hemoglobin 6.5.  Patient has had type and screen, is preparing for transfusion of 1 unit of blood.  Patient is amenable to this.  Pulse oximetry 100% Cardiac 65 sinus unremarkable  MDM Rules/Calculators/A&P Patient with a history of anemia, myelodysplasia, presents with fatigue, is found to have hemoglobin 6.5.  Patient is awake, alert, has no focal pain, no evidence for active exsanguination, but given his symptomatic anemia he will require transfusion.  Should this proceed without complication, patient may be appropriate for discharge with outpatient follow-up with his physicians.  He and I discussed the importance of doing this for additional options to minimize future episodes. Dr. Regenia Skeeter is aware of the patient. Final Clinical Impression(s) / ED Diagnoses Final diagnoses:  Symptomatic anemia   CRITICAL CARE Performed by: Carmin Muskrat Total critical care time: 35 minutes Critical care time was exclusive of separately billable procedures and treating other patients. Critical care was necessary to treat or prevent imminent or life-threatening deterioration. Critical care was time spent personally by me on the following activities: development of treatment plan with patient and/or surrogate as well as nursing, discussions with consultants, evaluation of patient's response to  treatment, examination of patient, obtaining history from patient or surrogate, ordering and performing treatments and interventions, ordering and review of laboratory studies, ordering and review of radiographic studies, pulse oximetry and re-evaluation of patient's condition.    Carmin Muskrat, MD 10/27/20 [phone]

## 2020-10-27 NOTE — ED Provider Notes (Signed)
Patient has finished his blood transfusion.  Vital signs are stable.  He knows to follow-up with his PCP at the [place].   Sherwood Gambler, MD 10/27/20 [phone]

## 2020-10-27 NOTE — ED Triage Notes (Signed)
Went to [place] is am and received shot (pt not sure of name for low hemoglobin).  Pt says he receives shot weekly for low hemoglobin for last 6 weeks.  Received 2 units in August.    [facility] and instructed him to go to ED, hemoglobin less than 7 and am labs.    Pt denies any pain of discomfort.

## 2020-10-28 LAB — BPAM RBC
Blood Product Expiration Date: 202112052359
ISSUE DATE / TIME: 202111101601
Unit Type and Rh: 9500

## 2020-10-28 LAB — TYPE AND SCREEN
ABO/RH(D): O NEG
Antibody Screen: NEGATIVE

## 2020-10-28 LAB — PATHOLOGIST SMEAR REVIEW

## 2022-08-03 NOTE — ED Provider Notes (Signed)
 [facility]  ED Provider Note  Ricardo [name] 71 y.o. male DOB: Oct 08, 1951 MRN: [id number] History   Chief Complaint  Patient presents with  . Abnormal Labs    [facility] for a HGB of 6. Denies increased fatigue or bloody stools   Ricardo [name] has chronic anemia.  He is a patient of the [place], and it sounds like he was recently on erythropoietin.  He was going for routine check today, and his hemoglobin was quite low.  He was sent to the emergency department for transfusion.  He thinks his last transfusion was around 2 years ago.  He is not experiencing any symptoms.  No fatigue, shortness of breath, or blood loss.   History provided by:  Patient     Past Medical History:  Diagnosis Date  . Anxiety   . Gout     No past surgical history on file.  Social History   Substance and Sexual Activity  Alcohol Use Yes  . Alcohol/week: 4.0 standard drinks of alcohol  . Types: 4 Cans of beer per week   Social History   Tobacco Use  Smoking Status Never  Smokeless Tobacco Not on file   E-Cigarettes  . Vaping Use    . Start Date    . Cartridges/Day    . Quit Date     Social History   Substance and Sexual Activity  Drug Use No         No Known Allergies  Home Medications   ALLOPURINOL  (ZYLOPRIM ) 100 MG TABLET    Take 100 mg by mouth.   ASPIRIN 81 MG EC TABLET    Take 81 mg by mouth.   CALCIUM CARBONATE-VITAMIN D (OYSTER SHELL CALCIUM/VITAMIN D) 500-200 MG-UNIT PER TABLET    Take by mouth.   FERROUS SULFATE (FERROUS SULFATE) 325 (65 FE) MG TABLET    Take one tablet (325 mg dose) by mouth daily.   FLUTICASONE (VERAMYST,FLONASE SENSIMIST) 27.5 MCG/SPRAY NASAL SPRAY    by Nasal route.   LORATADINE (CLARITIN) 10 MG TABLET    Take 10 mg by mouth.   LURASIDONE HCL (LATUDA) 60 MG TABS TABLET    Take by mouth.   MAGNESIUM OXIDE 420 MG TABLET    Take by mouth.   MIRTAZAPINE (REMERON) 30 MG TABLET    Take 30 mg by mouth.    MULTIPLE VITAMIN (MULTIVITAMIN) CAPSULE    Take by mouth.   NAPROXEN (NAPROSYN) 375 MG TABLET    Take 375 mg by mouth.   OXYBUTYNIN (DITROPAN) 5 MG TABLET    Take 5 mg by mouth 3 (three) times a day.   PANTOPRAZOLE SODIUM (PROTONIX) 40 MG TABLET    Take 40 mg by mouth.   SERTRALINE (ZOLOFT) 100 MG TABLET    Take 100 mg by mouth.   TRAMADOL (ULTRAM-ER) 100 MG 24 HR TABLET    Take 100 mg by mouth.   TRAZODONE (DESYREL) 50 MG TABLET    Take 50 mg by mouth.    Review of Systems   Review of Systems  Physical Exam   ED Triage Vitals [08/03/22 1321]  BP 121/70  Heart Rate 77  Resp 18  SpO2 98 %  Temp 98.4 F (36.9 C)    Physical Exam  Nursing note and vitals reviewed. Constitutional: He appears well-developed and well-nourished. He no respiratory distress.  HENT:  Head: Normocephalic and atraumatic.  Cardiovascular: Normal rate, regular rhythm and normal heart sounds.  No  audible murmur.  Pulmonary/Chest: No respiratory distress. Respiratory effort normal and breath sounds normal.  Musculoskeletal: No obvious deformity noted to extremities.   Neurological: He is alert and oriented to person, place, and time. Moves all extremities equally. Gait normal. He has normal speech.  Skin: There is pallor.  Psychiatric: He has a normal mood and affect. His behavior is normal.    ED Course   Lab results:   CBC AND DIFFERENTIAL - Abnormal      Result Value   WBC 6.0     RBC 2.07 (*)    HGB 6.4 (*)    HCT 22.3 (*)    MCV 108 (*)    MCH 30.9     MCHC 28.7 (*)    Comment: Slide reviewed.     Plt Ct 361     RDW SD 77.6 (*)    MPV 10.8     NRBC% 1.2 (*)    NRBC 0.070 (*)    NEUTROPHIL % 61.3     LYMPHOCYTE % 21.3 (*)    MONOCYTE % 11.0     Eosinophil % 2.2     BASOPHIL % 2.5 (*)    IG% 1.700 (*)    ABSOLUTE NEUTROPHIL COUNT 3.70     ABSOLUTE LYMPHOCYTE COUNT 1.3     MONO ABSOLUTE 0.7     EOS ABSOLUTE 0.1     BASO ABSOLUTE 0.2     IG ABSOLUTE 0.100 (*)   COMPREHENSIVE  METABOLIC PANEL - Abnormal   Na 139     Potassium 4.7     Cl 102     CO2 27     AGAP 10     Glucose 105 (*)    BUN 14     Creatinine 1.29 (*)    Ca 9.7     ALK PHOS 79     T Bili 0.71     Total Protein 7.5     Alb 4.4     GLOBULIN 3.1     ALBUMIN/GLOBULIN RATIO 1.4     BUN/CREAT RATIO 10.9 (*)    ALT 8     AST 34     eGFR 60     Comment: Normal GFR (glomerular filtration rate) > 60 mL/min/1.73 meters squared, < 60 may include impaired kidney function. Calculation based on the Chronic Kidney Disease Epidemiology Collaboration (CK-EPI)equation refit without adjustment for race.  TYPE AND SCREEN   ABO Rh Type O NEG     Antibody Screen NEG    PREPARE RBC    Imaging: No data to display  ECG: ECG Results   None                          Pre-Sedation Procedures    Medical Decision Making Ricardo [name] was sent from the [place] for a blood transfusion.  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  He is asymptomatic, but given the severity of his anemia, he will be transfused 1 unit of blood.  I have recommended that he see his [place] specialist for further instructions.  No indication for admission.  He is otherwise well-appearing.  Anemia, unspecified type: chronic illness or injury with severe exacerbation, progression, or side effects of treatment Amount and/or Complexity of Data Reviewed Labs: ordered. Decision-making details documented in ED Course.   Risk Prescription drug management.        Provider Communication  New Prescriptions  No medications on file    Modified Medications   No medications on file    Discontinued Medications   No medications on file    Clinical Impression   Final diagnoses:  Anemia, unspecified type    ED Disposition    ED Disposition  Discharge   Condition  Stable   Comment  --                  Electronically signed by:   Ricardo KANDICE Silvan, MD 08/04/22 [phone]

## 2023-04-18 ENCOUNTER — Encounter: Payer: Self-pay | Admitting: *Deleted

## 2023-05-15 ENCOUNTER — Telehealth: Payer: Self-pay | Admitting: Internal Medicine

## 2023-05-15 NOTE — Telephone Encounter (Signed)
Patient called to see when his colonoscopy would be scheduled.  We received his questionnaire but Darl Pikes is waiting on last colonoscopy results to be faxed from [facility] and [place], that is the hold up on scheduling.  The patient is now aware.

## 2023-05-18 NOTE — Telephone Encounter (Signed)
Received questionnaire and sent to provider for review.  

## 2023-05-21 ENCOUNTER — Telehealth: Payer: Self-pay | Admitting: *Deleted

## 2023-05-21 NOTE — Telephone Encounter (Signed)
  Procedure: Colonoscopy  Height: 6'1 Weight: 150lbs        Have you had a colonoscopy before?  2018 at the [place], records scanned in epic  Do you have family history of colon cancer?  no  Do you have a family history of polyps? no  Previous colonoscopy with polyps removed? no  Do you have a history colorectal cancer?   no  Are you diabetic?  no  Do you have a prosthetic or mechanical heart valve? no  Do you have a pacemaker/defibrillator?   no  Have you had endocarditis/atrial fibrillation?  no  Do you use supplemental oxygen/CPAP?  no  Have you had joint replacement within the last 12 months?  no  Do you tend to be constipated or have to use laxatives?  no   Do you have history of alcohol use? If yes, how much and how often.  no  Do you have history or are you using drugs? If yes, what do are you  using?  no  Have you ever had a stroke/heart attack?  no  Have you ever had a heart or other vascular stent placed,?no  Do you take weight loss medication? no  Do you take any blood-thinning medications such as: (Plavix, aspirin, Coumadin, Aggrenox, Brilinta, Xarelto, Eliquis, Pradaxa, Savaysa or Effient)? Aspirin 81mg   If yes we need the name, milligram, dosage and who is prescribing doctor:               Current Outpatient Medications  Medication Sig Dispense Refill   allopurinol (ZYLOPRIM) 100 MG tablet Take 1 tablet (100 mg total) by mouth 2 (two) times daily. (Patient taking differently: Take 100 mg by mouth daily.) 60 tablet 6   aspirin EC 81 MG tablet Take 81 mg by mouth daily.     calcium-vitamin D (OSCAL WITH D) 500-200 MG-UNIT tablet Take 1 tablet by mouth.     Cyanocobalamin (B-12 PO) Take by mouth in the morning and at bedtime.     fluticasone (VERAMYST) 27.5 MCG/SPRAY nasal spray Place 2 sprays into the nose daily.     LATANOPROST OP Apply to eye.     loratadine (CLARITIN) 10 MG tablet Take 10 mg by mouth daily.     methocarbamol (ROBAXIN) 500 MG tablet Take  500 mg by mouth in the morning and at bedtime.     mirtazapine (REMERON) 15 MG tablet Take 15 mg by mouth at bedtime.     Multiple Vitamin (MULTIVITAMIN) capsule Take by mouth.     pantoprazole (PROTONIX) 40 MG tablet Take 40 mg by mouth daily. As needed     sertraline (ZOLOFT) 50 MG tablet Take 50 mg by mouth daily.     TRAMADOL HCL ER PO Take 50 mg by mouth in the morning and at bedtime.     No current facility-administered medications for this visit.    No Known Allergies

## 2023-07-08 NOTE — Telephone Encounter (Signed)
I reviewed records scanned in on 05/18/23 and do not see a colonoscopy report.

## 2023-07-10 NOTE — Telephone Encounter (Signed)
I am trying to get some help from [place] at the [place] to get these records.

## 2023-07-13 NOTE — Telephone Encounter (Signed)
Received and reviewed colonoscopy records. Colonoscopy 05/12/2008: Normal exam  Colonoscopy 10/22/2012: Sharply angulated sigmoid colon switch to the peds colonoscope.  Otherwise normal exam.  Colonoscopy 08/07/2017: Poor prep, colonic mucosal able to be visualized within normal limits.  Rectal mucosa visualized within normal limits.  Recommended repeat colonoscopy in 5 years.  Patient is due for colonoscopy, but due to history of poor prep, recommend office visit. Please arrange.

## 2023-07-24 NOTE — Progress Notes (Signed)
Referring Provider: Hilarie Fredrickson  Primary Care Physician:  Hilarie Fredrickson Primary Gastroenterologist:  Dr. Marletta Lor  Chief Complaint  Patient presents with   Colonoscopy    Colonoscopy screening     HPI:   Ricardo [name] is a 72 y.o. male presenting today at the request of Kathrin Penner, PA-C for colon cancer screening.   Received outside colonoscopy records: Colonoscopy 10/22/2012: Sharply angulated sigmoid colon switch to the peds colonoscope.  Otherwise normal exam.  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    Colonoscopy 08/07/2017: Poor prep, colonic mucosal able to be visualized within normal limits.  Rectal mucosa visualized within normal limits.  Recommended repeat colonoscopy in 5 years.   Due to history of poor prep, recommend office visit prior to scheduling colonoscopy.   Today:  Doesn't remember having any trouble with last colonoscopy/last prep. States he was able to finish it with no problem. Didn't realize he had poor prep. Denies brbpr, melena, constipation, diarrhea, abdominal pain, nausea, vomiting, GERD (on Pantoprazole), or dysphagia. Reports weight has been fairly stable recently.   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 Follows with heme/onc getting labs every 4 weeks and receiving PRBCs as needed. Last blood transfusion was about 3 weeks ago. Used to take iron, but isn't currently.   Has had an EGD in 2013 with gastritis.   Rare use of NSAIDs aside from daily 81 mg aspirin.    Past Medical History:  Diagnosis Date   Alcohol abuse    Anemia    Arthritis    Carpal tunnel syndrome    Gout    History of radiation therapy 04/10/2017-06/04/2017   Site/dose:   The prostatic fossa was treated to 68.4 Gy in 38 fractions of 1.8 Gy   Myelofibrosis (HCC)    Prostate cancer (HCC)     Past Surgical History:   Procedure Laterality Date   CYSTECTOMY     left bicept   TENDON REPAIR     right thumb    Current Outpatient Medications  Medication Sig Dispense Refill   allopurinol (ZYLOPRIM) 100 MG tablet Take 1 tablet (100 mg total) by mouth 2 (two) times daily. (Patient taking differently: Take 100 mg by mouth daily.) 60 tablet 6   aspirin EC 81 MG tablet Take 81 mg by mouth daily.     calcium-vitamin D (OSCAL WITH D) 500-200 MG-UNIT tablet Take 1 tablet by mouth.     Cyanocobalamin (B-12 PO) Take by mouth in the morning and at bedtime.     fluticasone (VERAMYST) 27.5 MCG/SPRAY nasal spray Place 2 sprays into the nose daily.     LATANOPROST OP Apply to eye.     loratadine (CLARITIN) 10 MG tablet Take 10 mg by mouth daily.     methocarbamol (ROBAXIN) 500 MG tablet Take 500 mg by mouth in the morning and at bedtime.     mirtazapine (REMERON) 15 MG tablet Take 15 mg by mouth at bedtime.     Multiple Vitamin (MULTIVITAMIN) capsule Take by mouth.     pantoprazole (PROTONIX) 40 MG tablet Take 40 mg by mouth daily. As needed     sertraline (ZOLOFT) 50 MG tablet Take 50 mg by mouth daily.     TRAMADOL HCL ER PO Take 50 mg by mouth in the morning and at bedtime.     No current facility-administered medications for this visit.    Allergies as  of 07/26/2023   (No Known Allergies)    Family History  Problem Relation Age of Onset   Heart disease Mother    Hypertension Sister    Cancer Maternal Aunt        breast   Cancer Maternal Uncle        colon   Cancer Maternal Uncle        lung    Social History   Socioeconomic History   Marital status: Divorced    Spouse name: Not on file   Number of children: Not on file   Years of education: Not on file   Highest education level: Not on file  Occupational History   Not on file  Tobacco Use   Smoking status: Never   Smokeless tobacco: Current    Types: Snuff  Substance and Sexual Activity   Alcohol use: Yes    Comment: daily - beer or  liquor   Drug use: No   Sexual activity: Not Currently  Other Topics Concern   Not on file  Social History Narrative   Not on file   Social Determinants of Health   Financial Resource Strain: Not on file  Food Insecurity: Not on file  Transportation Needs: Not on file  Physical Activity: Not on file  Stress: Not on file  Social Connections: Unknown (05/01/2022)   Received from [facility]   Social Network    Social Network: Not on file  Intimate Partner Violence: Unknown (03/23/2022)   Received from [facility]   HITS    Physically Hurt: Not on file    Insult or Talk Down To: Not on file    Threaten Physical Harm: Not on file    Scream or Curse: Not on file    Review of Systems: Gen: Denies any fever, chills, cold or flu like symptoms, pre-syncope, or syncope.  CV: Denies chest pain, heart palpitations. Resp: Denies shortness of breath, cough.  GI: See HPI GU : Denies urinary burning, urinary frequency, urinary hesitancy MS: Denies joint pain. Derm: Denies ras. Psych: Denies depression, anxiety. Heme: See HPI  Physical Exam: BP 135/65 (BP Location: Right Arm, Patient Position: Sitting, Cuff Size: Normal)   Pulse 90   Temp 97.9 F (36.6 C) (Temporal)   Ht 6\' 1"  (1.854 m)   Wt 145 lb 3.2 oz (65.9 kg)   SpO2 97%   BMI 19.16 kg/m  General:   Alert and oriented. Pleasant and cooperative. Well-nourished and well-developed.  Head:  Normocephalic and atraumatic. Eyes:  Without icterus, sclera clear and conjunctiva pink.  Ears:  Normal auditory acuity. Lungs:  Clear to auscultation bilaterally. No wheezes, rales, or rhonchi. No distress.  Heart:  S1, S2 present without murmurs appreciated.  Abdomen:  +BS, soft, non-tender and non-distended. No HSM noted. No guarding or rebound. No masses appreciated.  Rectal:  Deferred  Msk:  Symmetrical without gross deformities. Normal posture. Extremities:  Without edema. Neurologic:  Alert and  oriented x4;  grossly normal  neurologically. Skin:  Intact without significant lesions or rashes. Psych:  Normal mood and affect.    Assessment:  72 y.o. 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 He has no history of colon polyps. Last colonoscopy was in August 2018 with normal exam, but exam was limited due to poor prep and recommended repeat colonoscopy in 5 years. He has no significant GI symptoms,  no alarm symptoms. No first degree relatives with history of colon cancer. Denies any known issues with his last colon prep.     Plan:  Proceed with colonoscopy with propofol by Dr. Marletta Lor in near future. The risks, benefits, and alternatives have been discussed with the patient in detail. The patient states understanding and desires to proceed.  ASA 3 2 days clears, Extra 1/2 bowel prep due to history of poor prep. No iron x 7 days prior. Follow-up PRN   Marylyn Ishihara [facility] 07/26/2023

## 2023-07-26 ENCOUNTER — Ambulatory Visit: Payer: Medicare Other | Admitting: Gastroenterology

## 2023-07-26 ENCOUNTER — Encounter: Payer: Self-pay | Admitting: *Deleted

## 2023-07-26 ENCOUNTER — Encounter: Payer: Self-pay | Admitting: Gastroenterology

## 2023-07-26 VITALS — BP 135/65 | HR 90 | Temp 97.9°F | Ht 73.0 in | Wt 145.2 lb

## 2023-07-26 DIAGNOSIS — Z1211 Encounter for screening for malignant neoplasm of colon: Secondary | ICD-10-CM

## 2023-07-26 NOTE — Patient Instructions (Signed)
We will get you scheduled for a colonoscopy in the near future with Dr. Marletta Lor. If you start taking iron for any reason, you will need to hold this for 7 days prior to your colonoscopy.  We will follow-up with you in the office as needed.  It was nice to meet you today!  Ermalinda Memos, PA-C [facility]

## 2023-07-29 ENCOUNTER — Encounter: Payer: Self-pay | Admitting: Gastroenterology

## 2023-08-01 ENCOUNTER — Telehealth: Payer: Self-pay | Admitting: *Deleted

## 2023-08-01 NOTE — Telephone Encounter (Signed)
LMTRC  TCS ASA 3, Dr.Carver 2 days clears Extra 1/2 prep Need to have peds scope available No iron x 7 days

## 2023-08-06 ENCOUNTER — Telehealth: Payer: Self-pay | Admitting: Internal Medicine

## 2023-08-06 NOTE — Telephone Encounter (Signed)
Patient returned your call.

## 2023-08-07 ENCOUNTER — Encounter: Payer: Self-pay | Admitting: Internal Medicine

## 2023-08-07 ENCOUNTER — Encounter: Payer: Self-pay | Admitting: *Deleted

## 2023-08-07 ENCOUNTER — Other Ambulatory Visit: Payer: Self-pay | Admitting: *Deleted

## 2023-08-07 MED ORDER — PEG 3350-KCL-NA BICARB-NACL 420 G PO SOLR: 4000.0000 mL | Freq: Once | ORAL | 0 refills | Status: AC

## 2023-08-07 NOTE — Telephone Encounter (Signed)
Ricardo [name]      08/06/23 11:23 AM Note Patient returned your call      [facility]

## 2023-08-07 NOTE — Telephone Encounter (Signed)
See previous TE

## 2023-08-07 NOTE — Telephone Encounter (Signed)
Pt has been scheduled for 09/10/23. Instructions mailed and prep sent to the pharmacy

## 2023-08-08 ENCOUNTER — Encounter: Payer: Self-pay | Admitting: *Deleted

## 2023-09-05 NOTE — Patient Instructions (Signed)
Ricardo [name]  09/05/2023     @PREFPERIOPPHARMACY @   Your procedure is scheduled on  09/10/2023.   Report to [name] at  0915  A.M.   Call this number if you have problems the morning of surgery:  [phone]  If you experience any cold or flu symptoms such as cough, fever, chills, shortness of breath, etc. between now and your scheduled surgery, please notify us at the above number.   Remember:  Follow the diet and prep instructions given to you by the office.     Take these medicines the morning of surgery with A SIP OF WATER        allopurinol, loratadine, robaxin, pantoprazole, sertraline, tramadol.     Do not wear jewelry, make-up or nail polish, including gel polish,  artificial nails, or any other type of covering on natural nails (fingers and  toes).  Do not wear lotions, powders, or perfumes, or deodorant.  Do not shave 48 hours prior to surgery.  Men may shave face and neck.  Do not bring valuables to the hospital.  [name] is not responsible for any belongings or valuables.  Contacts, dentures or bridgework may not be worn into surgery.  Leave your suitcase in the car.  After surgery it may be brought to your room.  For patients admitted to the hospital, discharge time will be determined by your treatment team.  Patients discharged the day of surgery will not be allowed to drive home and must have someone with them for 24 hours.     Special instructions:   DO NOT smoke tobacco or vape for 24 hours before your procedure.  Please read over the following fact sheets that you were given. Anesthesia Post-op Instructions and Care and Recovery After Surgery      Colonoscopy, Adult, Care After The following information offers guidance on how to care for yourself after your procedure. Your health care provider may also give you more specific instructions. If you have problems or questions, contact your health care provider. What can I expect  after the procedure? After the procedure, it is common to have: A small amount of blood in your stool for 24 hours after the procedure. Some gas. Mild cramping or bloating of your abdomen. Follow these instructions at home: Eating and drinking  Drink enough fluid to keep your urine pale yellow. Follow instructions from your health care provider about eating or drinking restrictions. Resume your normal diet as told by your health care provider. Avoid heavy or fried foods that are hard to digest. Activity Rest as told by your health care provider. Avoid sitting for a long time without moving. Get up to take short walks every 1-2 hours. This is important to improve blood flow and breathing. Ask for help if you feel weak or unsteady. Return to your normal activities as told by your health care provider. Ask your health care provider what activities are safe for you. Managing cramping and bloating  Try walking around when you have cramps or feel bloated. If directed, apply heat to your abdomen as told by your health care provider. Use the heat source that your health care provider recommends, such as a moist heat pack or a heating pad. Place a towel between your skin and the heat source. Leave the heat on for 20-30 minutes. Remove the heat if your skin turns bright red. This is especially important if you are unable to feel pain, heat,  or cold. You have a greater risk of getting burned. General instructions If you were given a sedative during the procedure, it can affect you for several hours. Do not drive or operate machinery until your health care provider says that it is safe. For the first 24 hours after the procedure: Do not sign important documents. Do not drink alcohol. Do your regular daily activities at a slower pace than normal. Eat soft foods that are easy to digest. Take over-the-counter and prescription medicines only as told by your health care provider. Keep all follow-up  visits. This is important. Contact a health care provider if: You have blood in your stool 2-3 days after the procedure. Get help right away if: You have more than a small spotting of blood in your stool. You have large blood clots in your stool. You have swelling of your abdomen. You have nausea or vomiting. You have a fever. You have increasing pain in your abdomen that is not relieved with medicine. These symptoms may be an emergency. Get help right away. Call 911. Do not wait to see if the symptoms will go away. Do not drive yourself to the hospital. Summary After the procedure, it is common to have a small amount of blood in your stool. You may also have mild cramping and bloating of your abdomen. If you were given a sedative during the procedure, it can affect you for several hours. Do not drive or operate machinery until your health care provider says that it is safe. Get help right away if you have a lot of blood in your stool, nausea or vomiting, a fever, or increased pain in your abdomen. This information is not intended to replace advice given to you by your health care provider. Make sure you discuss any questions you have with your health care provider. Document Revised: 01/16/2023 Document Reviewed: 07/27/2021 Elsevier Patient Education  2024 [employer]. Monitored Anesthesia Care, Care After The following information offers guidance on how to care for yourself after your procedure. Your health care provider may also give you more specific instructions. If you have problems or questions, contact your health care provider. What can I expect after the procedure? After the procedure, it is common to have: Tiredness. Little or no memory about what happened during or after the procedure. Impaired judgment when it comes to making decisions. Nausea or vomiting. Some trouble with balance. Follow these instructions at home: For the time period you were told by your health care  provider:  Rest. Do not participate in activities where you could fall or become injured. Do not drive or use machinery. Do not drink alcohol. Do not take sleeping pills or medicines that cause drowsiness. Do not make important decisions or sign legal documents. Do not take care of children on your own. Medicines Take over-the-counter and prescription medicines only as told by your health care provider. If you were prescribed antibiotics, take them as told by your health care provider. Do not stop using the antibiotic even if you start to feel better. Eating and drinking Follow instructions from your health care provider about what you may eat and drink. Drink enough fluid to keep your urine pale yellow. If you vomit: Drink clear fluids slowly and in small amounts as you are able. Clear fluids include water, ice chips, low-calorie sports drinks, and fruit juice that has water added to it (diluted fruit juice). Eat light and bland foods in small amounts as you are able. These foods include  bananas, applesauce, rice, lean meats, toast, and crackers. General instructions  Have a responsible adult stay with you for the time you are told. It is important to have someone help care for you until you are awake and alert. If you have sleep apnea, surgery and some medicines can increase your risk for breathing problems. Follow instructions from your health care provider about wearing your sleep device: When you are sleeping. This includes during daytime naps. While taking prescription pain medicines, sleeping medicines, or medicines that make you drowsy. Do not use any products that contain nicotine or tobacco. These products include cigarettes, chewing tobacco, and vaping devices, such as e-cigarettes. If you need help quitting, ask your health care provider. Contact a health care provider if: You feel nauseous or vomit every time you eat or drink. You feel light-headed. You are still sleepy or  having trouble with balance after 24 hours. You get a rash. You have a fever. You have redness or swelling around the IV site. Get help right away if: You have trouble breathing. You have new confusion after you get home. These symptoms may be an emergency. Get help right away. Call 911. Do not wait to see if the symptoms will go away. Do not drive yourself to the hospital. This information is not intended to replace advice given to you by your health care provider. Make sure you discuss any questions you have with your health care provider. Document Revised: 05/01/2022 Document Reviewed: 05/01/2022 Elsevier Patient Education  2024 [employer].

## 2023-09-07 ENCOUNTER — Other Ambulatory Visit: Payer: Self-pay

## 2023-09-07 ENCOUNTER — Encounter (HOSPITAL_COMMUNITY): Payer: Self-pay

## 2023-09-07 ENCOUNTER — Encounter (HOSPITAL_COMMUNITY)
Admission: RE | Admit: 2023-09-07 | Discharge: 2023-09-07 | Disposition: A | Discharge status: Home / Self Care | Payer: Medicare Other | Source: Ambulatory Visit | Attending: Internal Medicine | Admitting: Internal Medicine

## 2023-09-07 VITALS — Ht 73.0 in | Wt 153.2 lb

## 2023-09-07 DIAGNOSIS — D649 Anemia, unspecified: Secondary | ICD-10-CM | POA: Insufficient documentation

## 2023-09-07 DIAGNOSIS — F1090 Alcohol use, unspecified, uncomplicated: Secondary | ICD-10-CM | POA: Diagnosis not present

## 2023-09-07 DIAGNOSIS — Z01812 Encounter for preprocedural laboratory examination: Secondary | ICD-10-CM | POA: Diagnosis not present

## 2023-09-07 DIAGNOSIS — Z01818 Encounter for other preprocedural examination: Secondary | ICD-10-CM | POA: Diagnosis present

## 2023-09-07 LAB — CBC WITH DIFFERENTIAL/PLATELET
Abs Immature Granulocytes: 0.08 10*3/uL — ABNORMAL HIGH (ref 0.00–0.07)
Basophils Absolute: 0.1 10*3/uL (ref 0.0–0.1)
Basophils Relative: 2 %
Eosinophils Absolute: 0.1 10*3/uL (ref 0.0–0.5)
Eosinophils Relative: 2 %
HCT: 24.6 % — ABNORMAL LOW (ref 39.0–52.0)
Hemoglobin: 7.4 g/dL — ABNORMAL LOW (ref 13.0–17.0)
Immature Granulocytes: 2 %
Lymphocytes Relative: 23 %
Lymphs Abs: 1.2 10*3/uL (ref 0.7–4.0)
MCH: 31.4 pg (ref 26.0–34.0)
MCHC: 30.1 g/dL (ref 30.0–36.0)
MCV: 104.2 fL — ABNORMAL HIGH (ref 80.0–100.0)
Monocytes Absolute: 0.5 10*3/uL (ref 0.1–1.0)
Monocytes Relative: 10 %
Neutro Abs: 3.2 10*3/uL (ref 1.7–7.7)
Neutrophils Relative %: 61 %
Platelets: 307 10*3/uL (ref 150–400)
RBC: 2.36 MIL/uL — ABNORMAL LOW (ref 4.22–5.81)
RDW: 22.3 % — ABNORMAL HIGH (ref 11.5–15.5)
WBC: 5.3 10*3/uL (ref 4.0–10.5)
nRBC: 1.3 % — ABNORMAL HIGH (ref 0.0–0.2)

## 2023-09-07 LAB — COMPREHENSIVE METABOLIC PANEL
ALT: 15 U/L (ref 0–44)
AST: 27 U/L (ref 15–41)
Albumin: 4.2 g/dL (ref 3.5–5.0)
Alkaline Phosphatase: 62 U/L (ref 38–126)
Anion gap: 13 (ref 5–15)
BUN: 19 mg/dL (ref 8–23)
CO2: 23 mmol/L (ref 22–32)
Calcium: 9.1 mg/dL (ref 8.9–10.3)
Chloride: 105 mmol/L (ref 98–111)
Creatinine, Ser: 1.24 mg/dL (ref 0.61–1.24)
GFR, Estimated: >60 mL/min (ref >60)
Glucose, Bld: 103 mg/dL — ABNORMAL HIGH (ref 70–99)
Potassium: 3.7 mmol/L (ref 3.5–5.1)
Sodium: 141 mmol/L (ref 135–145)
Total Bilirubin: 1.3 mg/dL — ABNORMAL HIGH (ref 0.3–1.2)
Total Protein: 7.1 g/dL (ref 6.5–8.1)

## 2023-09-10 ENCOUNTER — Ambulatory Visit (HOSPITAL_COMMUNITY): Payer: No Typology Code available for payment source | Admitting: Anesthesiology

## 2023-09-10 ENCOUNTER — Ambulatory Visit (HOSPITAL_COMMUNITY)
Admission: RE | Admit: 2023-09-10 | Discharge: 2023-09-10 | Disposition: A | Discharge status: Home / Self Care | Payer: No Typology Code available for payment source | Attending: Internal Medicine | Admitting: Internal Medicine

## 2023-09-10 ENCOUNTER — Encounter (HOSPITAL_COMMUNITY): Payer: Self-pay

## 2023-09-10 ENCOUNTER — Encounter (HOSPITAL_COMMUNITY)
Admission: RE | Disposition: A | Discharge status: Home / Self Care | Payer: Self-pay | Source: Home / Self Care | Attending: Internal Medicine

## 2023-09-10 DIAGNOSIS — D120 Benign neoplasm of cecum: Secondary | ICD-10-CM

## 2023-09-10 DIAGNOSIS — K635 Polyp of colon: Secondary | ICD-10-CM | POA: Insufficient documentation

## 2023-09-10 DIAGNOSIS — Z1211 Encounter for screening for malignant neoplasm of colon: Secondary | ICD-10-CM

## 2023-09-10 DIAGNOSIS — K648 Other hemorrhoids: Secondary | ICD-10-CM | POA: Diagnosis not present

## 2023-09-10 DIAGNOSIS — F17220 Nicotine dependence, chewing tobacco, uncomplicated: Secondary | ICD-10-CM | POA: Insufficient documentation

## 2023-09-10 DIAGNOSIS — Z800 Family history of malignant neoplasm of digestive organs: Secondary | ICD-10-CM | POA: Insufficient documentation

## 2023-09-10 HISTORY — PX: COLONOSCOPY WITH PROPOFOL: SHX5780

## 2023-09-10 HISTORY — PX: POLYPECTOMY: SHX5525

## 2023-09-10 SURGERY — COLONOSCOPY WITH PROPOFOL
Anesthesia: General

## 2023-09-10 MED ORDER — EPHEDRINE SULFATE (PRESSORS) 50 MG/ML IJ SOLN
INTRAMUSCULAR | Status: DC | PRN
  Administered 2023-09-10 (×2): 5 mg via INTRAVENOUS

## 2023-09-10 MED ORDER — PROPOFOL 500 MG/50ML IV EMUL
INTRAVENOUS | Status: DC | PRN
  Administered 2023-09-10: 150 ug/kg/min via INTRAVENOUS

## 2023-09-10 MED ORDER — PROPOFOL 10 MG/ML IV BOLUS
INTRAVENOUS | Status: DC | PRN
  Administered 2023-09-10: 50 mg via INTRAVENOUS

## 2023-09-10 MED ORDER — LIDOCAINE HCL (CARDIAC) PF 100 MG/5ML IV SOSY
PREFILLED_SYRINGE | INTRAVENOUS | Status: DC | PRN
  Administered 2023-09-10: 60 mg via INTRAVENOUS

## 2023-09-10 MED ORDER — LACTATED RINGERS IV SOLN: INTRAVENOUS | Status: DC | PRN

## 2023-09-10 MED ORDER — STERILE WATER FOR IRRIGATION IR SOLN
Status: DC | PRN
  Administered 2023-09-10: 60 mL

## 2023-09-10 NOTE — H&P (Signed)
Primary Care Physician:  Kathrin Penner, PA-C Primary Gastroenterologist:  Dr. Marletta Lor  Pre-Procedure History & Physical: HPI:  Ricardo [name] is a 72 y.o. male is here for a colonoscopy for colon cancer screening purposes.  Patient denies any family history of colorectal cancer.  No melena or hematochezia.  No abdominal pain or unintentional weight loss.  No change in bowel habits.  Overall feels well from a GI standpoint.  Past Medical History:  Diagnosis Date   Alcohol abuse    Anemia    Arthritis    Carpal tunnel syndrome    Gout    History of radiation therapy 04/10/2017-06/04/2017   Site/dose:   The prostatic fossa was treated to 68.4 Gy in 38 fractions of 1.8 Gy   Myelofibrosis (HCC)    Prostate cancer [facility])     Past Surgical History:  Procedure Laterality Date   CYSTECTOMY     left bicept   PROSTATECTOMY  2014   TENDON REPAIR     right thumb    Prior to Admission medications   Medication Sig Start Date End Date Taking? Authorizing Provider  allopurinol (ZYLOPRIM) 100 MG tablet Take 1 tablet (100 mg total) by mouth 2 (two) times daily. Patient taking differently: Take 100 mg by mouth daily. 08/07/11  Yes [place], Velna Hatchet, MD  aspirin EC 81 MG tablet Take 81 mg by mouth daily.   Yes [provider]  calcium-vitamin D (OSCAL WITH D) 500-200 MG-UNIT tablet Take 1 tablet by mouth.   Yes [provider]  Cyanocobalamin (B-12 PO) Take by mouth in the morning and at bedtime.   Yes [provider]  fluticasone (VERAMYST) 27.5 MCG/SPRAY nasal spray Place 2 sprays into the nose daily.   Yes [provider]  LATANOPROST OP Apply to eye.   Yes [provider]  loratadine (CLARITIN) 10 MG tablet Take 10 mg by mouth daily.   Yes [provider]  methocarbamol (ROBAXIN) 500 MG tablet Take 500 mg by mouth in the morning and at bedtime.   Yes [provider]  mirtazapine (REMERON) 15 MG tablet Take 15 mg by mouth at  bedtime.   Yes [provider]  Multiple Vitamin (MULTIVITAMIN) capsule Take by mouth.   Yes [provider]  pantoprazole (PROTONIX) 40 MG tablet Take 40 mg by mouth daily. As needed   Yes [provider]  sertraline (ZOLOFT) 50 MG tablet Take 50 mg by mouth daily.   Yes [provider]  TRAMADOL HCL ER PO Take 50 mg by mouth in the morning and at bedtime.   Yes [provider]    Allergies as of 08/07/2023   (No Known Allergies)    Family History  Problem Relation Age of Onset   Heart disease Mother    Hypertension Sister    Cancer Maternal Aunt        breast   Cancer Maternal Uncle        colon   Cancer Maternal Uncle        lung    Social History   Socioeconomic History   Marital status: Divorced    Spouse name: Not on file   Number of children: Not on file   Years of education: Not on file   Highest education level: Not on file  Occupational History   Not on file  Tobacco Use   Smoking status: Never   Smokeless tobacco: Current    Types: Snuff  Vaping Use  Vaping status: Never Used  Substance and Sexual Activity   Alcohol use: Not Currently    Comment: last has ETOH 7 years ago as of 09/07/2023   Drug use: No   Sexual activity: Not Currently  Other Topics Concern   Not on file  Social History Narrative   Not on file   Social Determinants of Health   Financial Resource Strain: Not on file  Food Insecurity: Not on file  Transportation Needs: Not on file  Physical Activity: Not on file  Stress: Not on file  Social Connections: Unknown (05/01/2022)   Received from [facility], [facility]   Social Network    Social Network: Not on file  Intimate Partner Violence: Unknown (03/23/2022)   Received from [facility], [facility]   HITS    Physically Hurt: Not on file    Insult or Talk Down To: Not on file    Threaten Physical Harm: Not on file    Scream or Curse: Not on file    Review of Systems: See  HPI, otherwise negative ROS  Physical Exam: Vital signs in last 24 hours: Temp:  [98.7 F (37.1 C)] 98.7 F (37.1 C) (09/23 0953) Pulse Rate:  [67] 67 (09/23 0953) Resp:  [18] 18 (09/23 0953) BP: (141)/(61) 141/61 (09/23 0953) SpO2:  [100 %] 100 % (09/23 0953)   General:   Alert,  Well-developed, well-nourished, pleasant and cooperative in NAD Head:  Normocephalic and atraumatic. Eyes:  Sclera clear, no icterus.   Conjunctiva pink. Ears:  Normal auditory acuity. Nose:  No deformity, discharge,  or lesions. Msk:  Symmetrical without gross deformities. Normal posture. Extremities:  Without clubbing or edema. Neurologic:  Alert and  oriented x4;  grossly normal neurologically. Skin:  Intact without significant lesions or rashes. Psych:  Alert and cooperative. Normal mood and affect.  Impression/Plan: Ricardo [name] is here for a colonoscopy to be performed for colon cancer screening purposes.  The risks of the procedure including infection, bleed, or perforation as well as benefits, limitations, alternatives and imponderables have been reviewed with the patient. Questions have been answered. All parties agreeable.

## 2023-09-10 NOTE — Transfer of Care (Signed)
Immediate Anesthesia Transfer of Care Note  Patient: Ricardo [name]  Procedure(s) Performed: COLONOSCOPY WITH PROPOFOL POLYPECTOMY  Patient Location: Short Stay  Anesthesia Type:General  Level of Consciousness: awake, drowsy, and patient cooperative  Airway & Oxygen Therapy: Patient Spontanous Breathing  Post-op Assessment: Report given to RN, Post -op Vital signs reviewed and stable, and Patient moving all extremities X 4  Post vital signs: Reviewed and stable  Last Vitals:  Vitals Value Taken Time  BP    Temp    Pulse    Resp    SpO2      Last Pain:  Vitals:   09/10/23 1119  TempSrc:   PainSc: 0-No pain      Patients Stated Pain Goal: 5 (09/10/23 0953)  Complications: No notable events documented.

## 2023-09-10 NOTE — Anesthesia Postprocedure Evaluation (Signed)
Anesthesia Post Note  Patient: Ricardo [name]  Procedure(s) Performed: COLONOSCOPY WITH PROPOFOL POLYPECTOMY  Patient location during evaluation: Phase II Anesthesia Type: General Level of consciousness: awake Pain management: pain level controlled Vital Signs Assessment: post-procedure vital signs reviewed and stable Respiratory status: spontaneous breathing and respiratory function stable Cardiovascular status: blood pressure returned to baseline and stable Postop Assessment: no headache and no apparent nausea or vomiting Anesthetic complications: no Comments: Late entry   No notable events documented.   Last Vitals:  Vitals:   09/10/23 0953 09/10/23 1145  BP: (!) 141/61 (!) 122/49  Pulse: 67 78  Resp: 18 15  Temp: 37.1 C (!) 36.2 C  SpO2: 100% 100%    Last Pain:  Vitals:   09/10/23 1145  TempSrc: Axillary  PainSc: 0-No pain                 Windell Norfolk

## 2023-09-10 NOTE — Discharge Instructions (Signed)
  Colonoscopy Discharge Instructions  Read the instructions outlined below and refer to this sheet in the next few weeks. These discharge instructions provide you with general information on caring for yourself after you leave the hospital. Your doctor may also give you specific instructions. While your treatment has been planned according to the most current medical practices available, unavoidable complications occasionally occur.   ACTIVITY You may resume your regular activity, but move at a slower pace for the next 24 hours.  Take frequent rest periods for the next 24 hours.  Walking will help get rid of the air and reduce the bloated feeling in your belly (abdomen).  No driving for 24 hours (because of the medicine (anesthesia) used during the test).   Do not sign any important legal documents or operate any machinery for 24 hours (because of the anesthesia used during the test).  NUTRITION Drink plenty of fluids.  You may resume your normal diet as instructed by your doctor.  Begin with a light meal and progress to your normal diet. Heavy or fried foods are harder to digest and may make you feel sick to your stomach (nauseated).  Avoid alcoholic beverages for 24 hours or as instructed.  MEDICATIONS You may resume your normal medications unless your doctor tells you otherwise.  WHAT YOU CAN EXPECT TODAY Some feelings of bloating in the abdomen.  Passage of more gas than usual.  Spotting of blood in your stool or on the toilet paper.  IF YOU HAD POLYPS REMOVED DURING THE COLONOSCOPY: No aspirin products for 7 days or as instructed.  No alcohol for 7 days or as instructed.  Eat a soft diet for the next 24 hours.  FINDING OUT THE RESULTS OF YOUR TEST Not all test results are available during your visit. If your test results are not back during the visit, make an appointment with your caregiver to find out the results. Do not assume everything is normal if you have not heard from your  caregiver or the medical facility. It is important for you to follow up on all of your test results.  SEEK IMMEDIATE MEDICAL ATTENTION IF: You have more than a spotting of blood in your stool.  Your belly is swollen (abdominal distention).  You are nauseated or vomiting.  You have a temperature over 101.  You have abdominal pain or discomfort that is severe or gets worse throughout the day.   Your colonoscopy revealed 1 polyp(s) which I removed successfully.. I recommend repeating colonoscopy in 7-10 years for surveillance purposes if benefits outweigh risks. Otherwise follow up with GI as needed.    I hope you have a great rest of your week!  Hennie Duos. Marletta Lor, D.O. Gastroenterology and Hepatology [facility]

## 2023-09-10 NOTE — Op Note (Signed)
[facility] Patient Name: Ricardo [name] Procedure Date: 09/10/2023 11:09 AM MRN: [id number] Date of Birth: 1951/08/19 Attending MD: Hennie Duos. Marletta Lor , [place], [id number] CSN: [id number] Age: 72 Admit Type: Outpatient Procedure:                Colonoscopy Indications:              Screening for colorectal malignant neoplasm Providers:                Hennie Duos. Marletta Lor, DO, Angelica Ran, Lennice Sites                            Technician, Technician Referring MD:             Hennie Duos. Marletta Lor, DO Medicines:                See the Anesthesia note for documentation of the                            administered medications Complications:            No immediate complications. Estimated Blood Loss:     Estimated blood loss was minimal. Procedure:                Pre-Anesthesia Assessment:                           - The anesthesia plan was to use monitored                            anesthesia care (MAC).                           After obtaining informed consent, the colonoscope                            was passed under direct vision. Throughout the                            procedure, the patient's blood pressure, pulse, and                            oxygen saturations were monitored continuously. The                            PCF-HQ190L ([id number]) scope was introduced through                            the anus and advanced to the the cecum, identified                            by appendiceal orifice and ileocecal valve. The                            colonoscopy was performed without difficulty. The                            patient tolerated  the procedure well. The quality                            of the bowel preparation was evaluated using the                            BBPS [facility] Bowel Preparation Scale) with scores                            of: Right Colon = 2 (minor amount of residual                            staining, small fragments of stool and/or opaque                             liquid, but mucosa seen well), Transverse Colon = 3                            (entire mucosa seen well with no residual staining,                            small fragments of stool or opaque liquid) and Left                            Colon = 3 (entire mucosa seen well with no residual                            staining, small fragments of stool or opaque                            liquid). The total BBPS score equals 8. The quality                            of the bowel preparation was good. Scope In: 11:25:22 AM Scope Out: 11:39:44 AM Scope Withdrawal Time: 0 hours 10 minutes 25 seconds  Total Procedure Duration: 0 hours 14 minutes 22 seconds  Findings:      Non-bleeding internal hemorrhoids were found during endoscopy.      A 3 mm polyp was found in the cecum. The polyp was sessile. The polyp       was removed with a cold snare. Resection was complete, but the polyp       tissue was not retrieved.      The exam was otherwise without abnormality. Impression:               - Non-bleeding internal hemorrhoids.                           - One 3 mm polyp in the cecum, removed with a cold                            snare. Complete resection. Polyp tissue NOT  RETRIEVED                           - The examination was otherwise normal. Moderate Sedation:      Per Anesthesia Care Recommendation:           - Patient has a contact number available for                            emergencies. The signs and symptoms of potential                            delayed complications were discussed with the                            patient. Return to normal activities tomorrow.                            Written discharge instructions were provided to the                            patient.                           - Resume previous diet.                           - Continue present medications.                           - Repeat colonoscopy in 7-10  years for surveillance.                           - Return to GI clinic PRN. Procedure Code(s):        --- Professional ---                           [id number], Colonoscopy, flexible; with removal of                            tumor(s), polyp(s), or other lesion(s) by snare                            technique Diagnosis Code(s):        --- Professional ---                           Z12.11, Encounter for screening for malignant                            neoplasm of colon                           D12.0, Benign neoplasm of cecum                           K64.8, Other hemorrhoids CPT copyright 2022 American Medical Association. All rights reserved.  The codes documented in this report are preliminary and upon coder review may  be revised to meet current compliance requirements. Hennie Duos. Marletta Lor, DO Hennie Duos. Marletta Lor, DO 09/10/2023 11:42:46 AM This report has been signed electronically. Number of Addenda: 0

## 2023-09-10 NOTE — Anesthesia Preprocedure Evaluation (Signed)
Anesthesia Evaluation  Patient identified by MRN, date of birth, ID band Patient awake    Reviewed: Allergy & Precautions, H&P , NPO status , Patient's Chart, lab work & pertinent test results, reviewed documented beta blocker date and time   Airway Mallampati: II  TM Distance: >3 FB Neck ROM: full    Dental no notable dental hx.    Pulmonary neg pulmonary ROS   Pulmonary exam normal breath sounds clear to auscultation       Cardiovascular Exercise Tolerance: Good negative cardio ROS  Rhythm:regular Rate:Normal     Neuro/Psych  Neuromuscular disease negative neurological ROS  negative psych ROS   GI/Hepatic negative GI ROS, Neg liver ROS,,,  Endo/Other  negative endocrine ROS    Renal/GU negative Renal ROS  negative genitourinary   Musculoskeletal   Abdominal   Peds  Hematology negative hematology ROS (+) Blood dyscrasia, anemia   Anesthesia Other Findings   Reproductive/Obstetrics negative OB ROS                             Anesthesia Physical Anesthesia Plan  ASA: 2  Anesthesia Plan: General   Post-op Pain Management:    Induction:   PONV Risk Score and Plan: Propofol infusion  Airway Management Planned:   Additional Equipment:   Intra-op Plan:   Post-operative Plan:   Informed Consent: I have reviewed the patients History and Physical, chart, labs and discussed the procedure including the risks, benefits and alternatives for the proposed anesthesia with the patient or authorized representative who has indicated his/her understanding and acceptance.     Dental Advisory Given  Plan Discussed with: CRNA  Anesthesia Plan Comments:        Anesthesia Quick Evaluation

## 2023-09-18 ENCOUNTER — Encounter (HOSPITAL_COMMUNITY): Payer: Self-pay | Admitting: Internal Medicine

## 2023-09-20 ENCOUNTER — Encounter: Payer: Self-pay | Admitting: Internal Medicine

## 2023-09-27 ENCOUNTER — Other Ambulatory Visit: Payer: Self-pay

## 2023-09-27 ENCOUNTER — Emergency Department (HOSPITAL_COMMUNITY)
Admission: EM | Admit: 2023-09-27 | Discharge: 2023-09-27 | Disposition: A | Discharge status: Home / Self Care | Payer: No Typology Code available for payment source | Attending: Emergency Medicine | Admitting: Emergency Medicine

## 2023-09-27 ENCOUNTER — Encounter (HOSPITAL_COMMUNITY): Payer: Self-pay

## 2023-09-27 DIAGNOSIS — D649 Anemia, unspecified: Secondary | ICD-10-CM | POA: Insufficient documentation

## 2023-09-27 DIAGNOSIS — Z7982 Long term (current) use of aspirin: Secondary | ICD-10-CM | POA: Diagnosis not present

## 2023-09-27 LAB — BASIC METABOLIC PANEL
Anion gap: 8 (ref 5–15)
BUN: 21 mg/dL (ref 8–23)
CO2: 27 mmol/L (ref 22–32)
Calcium: 9.1 mg/dL (ref 8.9–10.3)
Chloride: 100 mmol/L (ref 98–111)
Creatinine, Ser: 1.38 mg/dL — ABNORMAL HIGH (ref 0.61–1.24)
GFR, Estimated: 55 mL/min — ABNORMAL LOW (ref >60)
Glucose, Bld: 116 mg/dL — ABNORMAL HIGH (ref 70–99)
Potassium: 3.9 mmol/L (ref 3.5–5.1)
Sodium: 135 mmol/L (ref 135–145)

## 2023-09-27 LAB — CBC WITH DIFFERENTIAL/PLATELET
Abs Immature Granulocytes: 0.06 10*3/uL (ref 0.00–0.07)
Basophils Absolute: 0.1 10*3/uL (ref 0.0–0.1)
Basophils Relative: 2 %
Eosinophils Absolute: 0.1 10*3/uL (ref 0.0–0.5)
Eosinophils Relative: 2 %
HCT: 21.6 % — ABNORMAL LOW (ref 39.0–52.0)
Hemoglobin: 6.4 g/dL — CL (ref 13.0–17.0)
Immature Granulocytes: 1 %
Lymphocytes Relative: 19 %
Lymphs Abs: 1 10*3/uL (ref 0.7–4.0)
MCH: 31.5 pg (ref 26.0–34.0)
MCHC: 29.6 g/dL — ABNORMAL LOW (ref 30.0–36.0)
MCV: 106.4 fL — ABNORMAL HIGH (ref 80.0–100.0)
Monocytes Absolute: 0.5 10*3/uL (ref 0.1–1.0)
Monocytes Relative: 8 %
Neutro Abs: 3.8 10*3/uL (ref 1.7–7.7)
Neutrophils Relative %: 68 %
Platelets: 277 10*3/uL (ref 150–400)
RBC: 2.03 MIL/uL — ABNORMAL LOW (ref 4.22–5.81)
RDW: 22.2 % — ABNORMAL HIGH (ref 11.5–15.5)
WBC: 5.6 10*3/uL (ref 4.0–10.5)
nRBC: 0.9 % — ABNORMAL HIGH (ref 0.0–0.2)

## 2023-09-27 LAB — PREPARE RBC (CROSSMATCH)

## 2023-09-27 MED ORDER — SODIUM CHLORIDE 0.9% IV SOLUTION: Freq: Once | INTRAVENOUS | Status: AC

## 2023-09-27 NOTE — ED Provider Notes (Signed)
Received signout from previous provider, please see her note for complete H&P.  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  Patient without any specific complaint.  He admits he gets a blood transfusion every 6 weeks at the [place].  Today labs remarkable for hemoglobin of 6.4.  Patient is currently receiving 1 unit of blood product and will be discharged home once blood transfusion started.  He is hemodynamically stable and agrees with plan.  .Critical Care  Performed by: Fayrene Helper, PA-C Authorized by: Fayrene Helper, PA-C   Critical care provider statement:    Critical care time (minutes):  30   Critical care was time spent personally by me on the following activities:  Development of treatment plan with patient or surrogate, discussions with consultants, evaluation of patient's response to treatment, examination of patient, ordering and review of laboratory studies, ordering and review of radiographic studies, ordering and performing treatments and interventions, pulse oximetry, re-evaluation of patient's condition and review of old charts  BP 124/66   Pulse 68   Temp 98.6 F (37 C) (Oral)   Resp 18   Ht 6\' 1"  (1.854 m)   Wt 69.5 kg   SpO2 99%   BMI 20.21 kg/m   Results for orders placed or performed during the hospital encounter of 09/27/23  CBC with Differential  Result Value Ref Range   WBC 5.6 4.0 - 10.5 K/uL   RBC 2.03 (L) 4.22 - 5.81 MIL/uL   Hemoglobin 6.4 (LL) 13.0 - 17.0 g/dL   HCT 13.0 (L) 86.5 - 78.4 %   MCV 106.4 (H) 80.0 - 100.0 fL   MCH 31.5 26.0 - 34.0 pg   MCHC 29.6 (L) 30.0 - 36.0 g/dL   RDW 69.6 (H) 29.5 - 28.4 %   Platelets 277 150 - 400 K/uL   nRBC 0.9 (H) 0.0 - 0.2 %   Neutrophils Relative % 68 %   Neutro Abs 3.8 1.7 - 7.7 K/uL   Lymphocytes Relative 19 %   Lymphs Abs 1.0 0.7 - 4.0 K/uL   Monocytes Relative 8 %    Monocytes Absolute 0.5 0.1 - 1.0 K/uL   Eosinophils Relative 2 %   Eosinophils Absolute 0.1 0.0 - 0.5 K/uL   Basophils Relative 2 %   Basophils Absolute 0.1 0.0 - 0.1 K/uL   WBC Morphology MORPHOLOGY UNREMARKABLE    RBC Morphology See Note    Smear Review MORPHOLOGY UNREMARKABLE    Immature Granulocytes 1 %   Abs Immature Granulocytes 0.06 0.00 - 0.07 K/uL   Bite Cells PRESENT    Tear Drop Cells PRESENT    Polychromasia PRESENT   Basic metabolic panel  Result Value Ref Range   Sodium 135 135 - 145 mmol/L   Potassium 3.9 3.5 - 5.1 mmol/L   Chloride 100 98 - 111 mmol/L   CO2 27 22 - 32 mmol/L   Glucose, Bld 116 (H) 70 - 99 mg/dL   BUN 21 8 - 23 mg/dL   Creatinine, Ser 1.32 (H) 0.61 - 1.24 mg/dL   Calcium 9.1 8.9 - 44.0 mg/dL   GFR, Estimated 55 (L) >60 mL/min   Anion gap 8 5 - 15  Type and screen [facility]  Result Value Ref Range   ABO/RH(D) O NEG    Antibody Screen NEG    Sample Expiration 09/30/2023,2359    Unit Number  [id number]    Blood Component Type RED CELLS,LR    Unit division 00    Status of Unit ISSUED    Transfusion Status OK TO TRANSFUSE    Crossmatch Result      Compatible Performed at [facility], [address], [place], [place] [address]   Prepare RBC (crossmatch)  Result Value Ref Range   Order Confirmation      ORDER PROCESSED BY BLOOD BANK Performed at [facility], [address], [place], [place] [address]   BPAM [facility]  Result Value Ref Range   ISSUE DATE / TIME [id number]    Blood Product Unit Number [id number]    Unit Type and Rh 9500    Blood Product Expiration Date 010272536644    No results found.    Fayrene Helper, PA-C 09/27/23 2305    Terrilee Files, MD 09/28/23 1019

## 2023-09-27 NOTE — ED Triage Notes (Signed)
Pt reports he is seen at the [facility] every 4 weeks to check his hemoglobin as he has chronic anemia.  Pt reports they usually call him and send him to [facility] hospital for the transfusion right after his lab draw but they didn't call him until after he got home today so he came here.  Reports his hgb is 6.1.

## 2023-09-27 NOTE — ED Notes (Signed)
Blood transfusion started at 1937. Patient without any distress . No respiratory distress.Noted lung sounds clear.

## 2023-09-27 NOTE — Discharge Instructions (Addendum)
You have received 1 unit of blood products for your anemia with a hemoglobin of 6.4.  Please follow-up with your doctor for further care.

## 2023-09-27 NOTE — ED Provider Notes (Signed)
[place] EMERGENCY DEPARTMENT AT [facility] Provider Note   CSN: [id number] Arrival date & time: 09/27/23  1653     History  Chief Complaint  Patient presents with   Abnormal Lab    Ricardo [name] is a 72 y.o. male.  Patient reports history of "pulmonary fibrosis", states he has to get labs she does use around every 6 weeks, was on medication previously that was improving this but they cause kidney problems so he had to stop.  He had routine blood work today and got called once he got back home from [place] that hemoglobin was 6.1 and he needed a transfusion.  Denies chest pain, shortness of breath, abdominal pain, recently had screening colonoscopy, states this is reassuring.  Denies blood in the stool or other abnormal bleeding, he is not on blood thinners.   Abnormal Lab      Home Medications Prior to Admission medications   Medication Sig Start Date End Date Taking? Authorizing Provider  allopurinol (ZYLOPRIM) 100 MG tablet Take 1 tablet (100 mg total) by mouth 2 (two) times daily. Patient taking differently: Take 100 mg by mouth daily. 08/07/11   Salley Scarlet, MD  aspirin EC 81 MG tablet Take 81 mg by mouth daily.    [provider]  calcium-vitamin D (OSCAL WITH D) 500-200 MG-UNIT tablet Take 1 tablet by mouth.    [provider]  Cyanocobalamin (B-12 PO) Take by mouth in the morning and at bedtime.    [provider]  fluticasone (VERAMYST) 27.5 MCG/SPRAY nasal spray Place 2 sprays into the nose daily.    [provider]  LATANOPROST OP Apply to eye.    [provider]  loratadine (CLARITIN) 10 MG tablet Take 10 mg by mouth daily.    [provider]  methocarbamol (ROBAXIN) 500 MG tablet Take 500 mg by mouth in the morning and at bedtime.    [provider]  mirtazapine (REMERON) 15 MG tablet Take 15 mg by mouth at bedtime.    [provider]  Multiple Vitamin (MULTIVITAMIN)  capsule Take by mouth.    [provider]  pantoprazole (PROTONIX) 40 MG tablet Take 40 mg by mouth daily. As needed    [provider]  sertraline (ZOLOFT) 50 MG tablet Take 50 mg by mouth daily.    [provider]  TRAMADOL HCL ER PO Take 50 mg by mouth in the morning and at bedtime.    [provider]      Allergies    Patient has no known allergies.    Review of Systems   Review of Systems  Physical Exam Updated Vital Signs BP (!) 127/59 (BP Location: Right Arm)   Pulse 85   Temp 98.9 F (37.2 C) (Oral)   Resp 16   Ht 6\' 1"  (1.854 m)   Wt 69.5 kg   SpO2 97%   BMI 20.21 kg/m  Physical Exam Vitals and nursing note reviewed.  Constitutional:      General: He is not in acute distress.    Appearance: He is well-developed.  HENT:     Head: Normocephalic and atraumatic.     Mouth/Throat:     Mouth: Mucous membranes are moist.  Eyes:     General: No scleral icterus.    Extraocular Movements: Extraocular movements intact.     Pupils: Pupils are equal, round, and reactive to light.     Comments: Patient's conjunctiva are pale  Cardiovascular:  Rate and Rhythm: Normal rate and regular rhythm.     Heart sounds: No murmur heard. Pulmonary:     Effort: Pulmonary effort is normal. No respiratory distress.     Breath sounds: Normal breath sounds.  Abdominal:     Palpations: Abdomen is soft.     Tenderness: There is no abdominal tenderness.  Musculoskeletal:        General: No swelling.     Cervical back: Neck supple.  Skin:    General: Skin is warm and dry.     Capillary Refill: Capillary refill takes less than 2 seconds.  Neurological:     General: No focal deficit present.     Mental Status: He is alert and oriented to person, place, and time.  Psychiatric:        Mood and Affect: Mood normal.     ED Results / Procedures / Treatments   Labs (all labs ordered are listed, but only abnormal results are displayed) Labs Reviewed   CBC WITH DIFFERENTIAL/PLATELET  BASIC METABOLIC PANEL  TYPE AND SCREEN    EKG None  Radiology No results found.  Procedures Procedures    Medications Ordered in ED Medications - No data to display  ED Course/ Medical Decision Making/ A&P                                 Medical Decision Making Amount and/or Complexity of Data Reviewed Labs: ordered.   Dx: Chronic anemia, acute blood loss anemia, myelodysplastic syndrome, other  ED course: Patient presents to the ER today with hemoglobin of 6.1 on outpatient labs, has chronic anemia, has to get transfusions about every 6 weeks at the [place] he reports.  He is well-appearing with reassuring vitals.  Plan to give 1 unit and discharged home.  Signed out to PA [place] pending labs, anticipate infusion and discharged home.        Final Clinical Impression(s) / ED Diagnoses Final diagnoses:  None    Rx / DC Orders ED Discharge Orders     None         Ma Rings, PA-C 09/27/23 1846    Terrilee Files, MD 09/28/23 1019

## 2023-10-01 LAB — TYPE AND SCREEN
ABO/RH(D): O NEG
Antibody Screen: NEGATIVE
Unit division: 0

## 2023-10-01 LAB — BPAM RBC
Blood Product Expiration Date: 202411052359
ISSUE DATE / TIME: 202410101924
Unit Type and Rh: 9500

## 2024-06-19 ENCOUNTER — Emergency Department (HOSPITAL_COMMUNITY)
Admission: EM | Admit: 2024-06-19 | Discharge: 2024-06-19 | Disposition: A | Discharge status: Home / Self Care | Source: Ambulatory Visit | Attending: Emergency Medicine | Admitting: Emergency Medicine

## 2024-06-19 ENCOUNTER — Encounter (HOSPITAL_COMMUNITY): Payer: Self-pay

## 2024-06-19 ENCOUNTER — Other Ambulatory Visit: Payer: Self-pay

## 2024-06-19 ENCOUNTER — Encounter: Payer: Self-pay | Admitting: Internal Medicine

## 2024-06-19 DIAGNOSIS — Z7982 Long term (current) use of aspirin: Secondary | ICD-10-CM | POA: Diagnosis not present

## 2024-06-19 DIAGNOSIS — D649 Anemia, unspecified: Secondary | ICD-10-CM | POA: Insufficient documentation

## 2024-06-19 DIAGNOSIS — R5383 Other fatigue: Secondary | ICD-10-CM | POA: Insufficient documentation

## 2024-06-19 LAB — BASIC METABOLIC PANEL WITH GFR
Anion gap: 9 (ref 5–15)
BUN: 15 mg/dL (ref 8–23)
CO2: 26 mmol/L (ref 22–32)
Calcium: 9.2 mg/dL (ref 8.9–10.3)
Chloride: 102 mmol/L (ref 98–111)
Creatinine, Ser: 1.13 mg/dL (ref 0.61–1.24)
GFR, Estimated: >60 mL/min (ref >60)
Glucose, Bld: 95 mg/dL (ref 70–99)
Potassium: 4.3 mmol/L (ref 3.5–5.1)
Sodium: 137 mmol/L (ref 135–145)

## 2024-06-19 LAB — CBC
HCT: 24.7 % — ABNORMAL LOW (ref 39.0–52.0)
Hemoglobin: 6.8 g/dL — CL (ref 13.0–17.0)
MCH: 30.4 pg (ref 26.0–34.0)
MCHC: 27.5 g/dL — ABNORMAL LOW (ref 30.0–36.0)
MCV: 110.3 fL — ABNORMAL HIGH (ref 80.0–100.0)
Platelets: 319 10*3/uL (ref 150–400)
RBC: 2.24 MIL/uL — ABNORMAL LOW (ref 4.22–5.81)
RDW: 24.4 % — ABNORMAL HIGH (ref 11.5–15.5)
WBC: 4.8 10*3/uL (ref 4.0–10.5)
nRBC: 5 % — ABNORMAL HIGH (ref 0.0–0.2)

## 2024-06-19 LAB — PREPARE RBC (CROSSMATCH)

## 2024-06-19 MED ORDER — SODIUM CHLORIDE 0.9% IV SOLUTION: Freq: Once | INTRAVENOUS | Status: AC

## 2024-06-19 NOTE — Discharge Instructions (Signed)
[facility]

## 2024-06-19 NOTE — ED Notes (Addendum)
 Ricardo [name]

## 2024-06-19 NOTE — ED Provider Notes (Signed)
 [place] EMERGENCY DEPARTMENT AT [facility] Provider Note   CSN: [id number] Arrival date & time: 06/19/24  1636     Patient presents with: Anemia   Ricardo [name] is a 73 y.o. 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  He has his hemoglobin checked approximately every 3 weeks, patient states his baseline tends to run around 8 or better, his blood test drawn yesterday was 6.7.  He does state he gets shots every month to help control his anemia which he cannot name.  Patient denies chest pain, shortness of breath, dizziness but does endorse generalized fatigue.  He denies any bleeding, blood in stools or unexplained bruising.   The history is provided by the patient.       Prior to Admission medications   Medication Sig Start Date End Date Taking? Authorizing Provider  acetaminophen  (TYLENOL ) 325 MG tablet Take 650 mg by mouth every 6 (six) hours as needed for moderate pain.    [provider]  allopurinol  (ZYLOPRIM ) 300 MG tablet Take 300 mg by mouth at bedtime. 10/12/22   [provider]  aspirin EC 81 MG tablet Take 81 mg by mouth daily.    [provider]  aspirin-sod bicarb-citric acid (ALKA-SELTZER) 325 MG TBEF tablet Take 325 mg by mouth every 6 (six) hours as needed (upset stomach).    [provider]  calcium-vitamin D (OSCAL WITH D) 500-200 MG-UNIT tablet Take 1 tablet by mouth.    [provider]  cyanocobalamin (VITAMIN B12) 500 MCG tablet Take 2 tablets by mouth daily. 07/26/23   [provider]  diclofenac Sodium (VOLTAREN) 1 % GEL Apply 4 g topically 3 (three) times daily as needed (pain). 07/26/23   [provider]  fluticasone (FLONASE) 50 MCG/ACT nasal spray Place 2 sprays into both nostrils daily. 07/26/23   [provider]  LATANOPROST OP Apply 1 drop to eye at bedtime.    [provider]  loratadine (CLARITIN) 10 MG tablet Take 10 mg by mouth daily.    [provider]  methocarbamol (ROBAXIN) 500 MG tablet Take 500 mg by mouth in the morning and at bedtime.    [provider]  mirtazapine (REMERON) 15 MG tablet Take 15 mg by mouth at bedtime.    [provider]  Multiple Vitamin (MULTIVITAMIN) capsule Take by mouth.    [provider]  pantoprazole (PROTONIX) 40 MG tablet Take 40 mg by mouth daily. As needed    [provider]  polyethylene glycol (MIRALAX / GLYCOLAX) 17 g packet Take 17 g by mouth daily.    [provider]  sertraline (ZOLOFT) 50 MG tablet Take 50 mg by mouth daily.    [provider]  traMADol (ULTRAM) 50 MG tablet Take 1 tablet by mouth 2 (two) times daily as needed. 07/26/23   [provider]    Allergies: Patient has no known allergies.    Review of Systems  Constitutional:  Positive for fatigue. Negative for fever.  HENT:  Negative for congestion.   Eyes: Negative.   Respiratory:  Negative for chest tightness and shortness of breath.   Cardiovascular:  Negative for chest pain.  Gastrointestinal:  Negative for abdominal pain, nausea and vomiting.  Genitourinary: Negative.   Musculoskeletal:  Negative for arthralgias, joint swelling and neck pain.  Skin: Negative.  Negative for rash and wound.  Neurological:  Negative for dizziness, weakness, light-headedness, numbness and headaches.  Psychiatric/Behavioral: Negative.    All other systems reviewed and are negative.   Updated Vital Signs BP (!) 143/81   Pulse 63   Temp 98.3 F (36.8 C) (Oral)   Resp 18   Ht 6' 1 (1.854 m)   Wt 69.5 kg   SpO2 100%   BMI 20.21 kg/m   Physical Exam Vitals and nursing note reviewed.  Constitutional:      Appearance: He is well-developed. He is not ill-appearing.  HENT:     Head: Normocephalic and  atraumatic.  Eyes:     Conjunctiva/sclera: Conjunctivae normal.     Comments: Conjunctival pallor  Cardiovascular:     Rate and Rhythm: Normal rate and regular rhythm.     Heart sounds: Normal heart sounds.  Pulmonary:     Effort: Pulmonary effort is normal.     Breath sounds: Normal breath sounds. No wheezing.  Abdominal:     General: Bowel sounds are normal.     Palpations: Abdomen is soft.     Tenderness: There is no abdominal tenderness.  Musculoskeletal:        General: Normal range of motion.     Cervical back: Normal range of motion.  Skin:    General: Skin is warm and dry.  Neurological:     Mental Status: He is alert.     (all labs ordered are listed, but only abnormal results are displayed) Labs Reviewed  CBC - Abnormal; Notable for the following components:      Result Value   RBC 2.24 (*)    Hemoglobin 6.8 (*)    HCT 24.7 (*)    MCV 110.3 (*)    MCHC 27.5 (*)    RDW 24.4 (*)    nRBC 5.0 (*)    All other components within normal limits  BASIC METABOLIC PANEL WITH GFR  TYPE AND SCREEN  PREPARE RBC (CROSSMATCH)    EKG: None  Radiology: No results found.   Procedures   Medications Ordered in the ED  0.9 %  sodium chloride  infusion (Manually program via Guardrails IV Fluids) (has no administration in time range)                                    Medical Decision Making Patient presenting for a blood transfusion with a hemoglobin of 6.7 per his primary provider, repeat today is 6.8.  Since he is symptomatic with fatigue we will give him a unit of red blood cells.  Will plan discharge after this has infused.  Discussed with Madelin Gentry who will dispo patient once completed.  Amount and/or Complexity of Data Reviewed Labs: ordered.    Details: Per above, significant labs include a hemoglobin of 6.8 WBC count is 4.8 and platelets are 319, be met normal.  Risk Prescription drug management.        Final diagnoses:  Symptomatic anemia     ED Discharge Orders     None          Birdena Mliss RIGGERS 06/19/24 ARTEMUS Towana Ozell JAYSON, MD 06/20/24 1056

## 2024-06-19 NOTE — ED Notes (Signed)
 Upon this RN's shift assessment patient is alert and oriented x 4. Lungs sounds are clear/ diminished bilaterally. Skin normal and appropriate per ethnicity. No rashes present.  [place] RN

## 2024-06-19 NOTE — ED Provider Notes (Signed)
   Patient signed out to me by Ricardo Narrow, PA-C pending reevaluation after blood transfusion  Patient has history of myelofibrosis the results of chronic anemia he is under the care of the [place] [facility].  Had hemoglobin today of 6.8  He was transfused with 1 unit of RBCs.  See previous provider H&P for complete note  On my exam, patient resting comfortably.  He has been transfused 1 unit without complications.  Denies any new or worsening symptoms.  [facility]     Ricardo Milling, PA-C 06/19/24 2231    Towana Ozell BROCKS, MD 06/20/24 1057

## 2024-06-19 NOTE — ED Triage Notes (Signed)
 Pt arrived via POV following a phone call from his [place] Doctor. Pt was told to go to the hospital to receive a blood transfusion. Pts reports his Hgb is 6.7. Pt presents in NAD.

## 2024-06-20 LAB — TYPE AND SCREEN
ABO/RH(D): O NEG
Antibody Screen: NEGATIVE
Unit division: 0

## 2024-06-20 LAB — BPAM RBC
Blood Product Expiration Date: 202507102359
ISSUE DATE / TIME: 202507031855
Unit Type and Rh: 9500

## 2024-07-03 ENCOUNTER — Other Ambulatory Visit: Payer: Self-pay

## 2024-07-03 ENCOUNTER — Encounter (HOSPITAL_COMMUNITY): Payer: Self-pay

## 2024-07-03 ENCOUNTER — Inpatient Hospital Stay (HOSPITAL_COMMUNITY)
Admission: EM | Admit: 2024-07-03 | Discharge: 2024-07-10 | DRG: 841 | Disposition: A | Discharge status: Home / Self Care | Source: Ambulatory Visit | Attending: Family Medicine | Admitting: Family Medicine

## 2024-07-03 ENCOUNTER — Emergency Department (HOSPITAL_COMMUNITY)

## 2024-07-03 DIAGNOSIS — Z720 Tobacco use: Secondary | ICD-10-CM

## 2024-07-03 DIAGNOSIS — R636 Underweight: Secondary | ICD-10-CM | POA: Diagnosis present

## 2024-07-03 DIAGNOSIS — D649 Anemia, unspecified: Secondary | ICD-10-CM | POA: Diagnosis not present

## 2024-07-03 DIAGNOSIS — Z681 Body mass index (BMI) 19 or less, adult: Secondary | ICD-10-CM

## 2024-07-03 DIAGNOSIS — R8271 Bacteriuria: Secondary | ICD-10-CM | POA: Diagnosis present

## 2024-07-03 DIAGNOSIS — R5081 Fever presenting with conditions classified elsewhere: Secondary | ICD-10-CM | POA: Diagnosis present

## 2024-07-03 DIAGNOSIS — D638 Anemia in other chronic diseases classified elsewhere: Secondary | ICD-10-CM | POA: Diagnosis present

## 2024-07-03 DIAGNOSIS — Z9079 Acquired absence of other genital organ(s): Secondary | ICD-10-CM

## 2024-07-03 DIAGNOSIS — M436 Torticollis: Secondary | ICD-10-CM | POA: Diagnosis present

## 2024-07-03 DIAGNOSIS — D471 Chronic myeloproliferative disease: Principal | ICD-10-CM | POA: Diagnosis present

## 2024-07-03 DIAGNOSIS — Z79899 Other long term (current) drug therapy: Secondary | ICD-10-CM

## 2024-07-03 DIAGNOSIS — Z923 Personal history of irradiation: Secondary | ICD-10-CM

## 2024-07-03 DIAGNOSIS — Z8546 Personal history of malignant neoplasm of prostate: Secondary | ICD-10-CM

## 2024-07-03 DIAGNOSIS — D709 Neutropenia, unspecified: Secondary | ICD-10-CM | POA: Diagnosis present

## 2024-07-03 DIAGNOSIS — N183 Chronic kidney disease, stage 3 unspecified: Secondary | ICD-10-CM

## 2024-07-03 DIAGNOSIS — D500 Iron deficiency anemia secondary to blood loss (chronic): Secondary | ICD-10-CM | POA: Diagnosis present

## 2024-07-03 DIAGNOSIS — R8281 Pyuria: Secondary | ICD-10-CM | POA: Diagnosis present

## 2024-07-03 DIAGNOSIS — D7581 Myelofibrosis: Secondary | ICD-10-CM | POA: Diagnosis present

## 2024-07-03 DIAGNOSIS — N1831 Chronic kidney disease, stage 3a: Secondary | ICD-10-CM | POA: Diagnosis present

## 2024-07-03 DIAGNOSIS — Z1152 Encounter for screening for COVID-19: Secondary | ICD-10-CM

## 2024-07-03 DIAGNOSIS — R17 Unspecified jaundice: Secondary | ICD-10-CM | POA: Diagnosis present

## 2024-07-03 DIAGNOSIS — K219 Gastro-esophageal reflux disease without esophagitis: Secondary | ICD-10-CM | POA: Diagnosis present

## 2024-07-03 DIAGNOSIS — Z7982 Long term (current) use of aspirin: Secondary | ICD-10-CM

## 2024-07-03 DIAGNOSIS — M109 Gout, unspecified: Secondary | ICD-10-CM | POA: Diagnosis present

## 2024-07-03 DIAGNOSIS — F32A Depression, unspecified: Secondary | ICD-10-CM | POA: Diagnosis present

## 2024-07-03 DIAGNOSIS — Z8249 Family history of ischemic heart disease and other diseases of the circulatory system: Secondary | ICD-10-CM

## 2024-07-03 HISTORY — DX: Chronic kidney disease, stage 3 unspecified: N18.30

## 2024-07-03 LAB — HEMOGLOBIN AND HEMATOCRIT, BLOOD
HCT: 22.6 % — ABNORMAL LOW (ref 39.0–52.0)
HCT: 28.7 % — ABNORMAL LOW (ref 39.0–52.0)
Hemoglobin: 6.7 g/dL — CL (ref 13.0–17.0)
Hemoglobin: 8.7 g/dL — ABNORMAL LOW (ref 13.0–17.0)

## 2024-07-03 LAB — CBC WITH DIFFERENTIAL/PLATELET
Abs Immature Granulocytes: 0.3 K/uL — ABNORMAL HIGH (ref 0.00–0.07)
Band Neutrophils: 7 %
Basophils Absolute: 0.1 K/uL (ref 0.0–0.1)
Basophils Relative: 2 %
Eosinophils Absolute: 0 K/uL (ref 0.0–0.5)
Eosinophils Relative: 1 %
HCT: 20.3 % — ABNORMAL LOW (ref 39.0–52.0)
Hemoglobin: 5.9 g/dL — CL (ref 13.0–17.0)
Lymphocytes Relative: 33 %
Lymphs Abs: 1.3 K/uL (ref 0.7–4.0)
MCH: 30.6 pg (ref 26.0–34.0)
MCHC: 29.1 g/dL — ABNORMAL LOW (ref 30.0–36.0)
MCV: 105.2 fL — ABNORMAL HIGH (ref 80.0–100.0)
Metamyelocytes Relative: 6 %
Monocytes Absolute: 0.1 K/uL (ref 0.1–1.0)
Monocytes Relative: 2 %
Myelocytes: 2 %
Neutro Abs: 2.2 K/uL (ref 1.7–7.7)
Neutrophils Relative %: 47 %
Platelets: 336 K/uL (ref 150–400)
RBC: 1.93 MIL/uL — ABNORMAL LOW (ref 4.22–5.81)
RDW: 23.1 % — ABNORMAL HIGH (ref 11.5–15.5)
WBC: 4 K/uL (ref 4.0–10.5)
nRBC: 5 % — ABNORMAL HIGH (ref 0.0–0.2)
nRBC: 5 /100{WBCs} — ABNORMAL HIGH

## 2024-07-03 LAB — BASIC METABOLIC PANEL WITH GFR
Anion gap: 11 (ref 5–15)
BUN: 21 mg/dL (ref 8–23)
CO2: 23 mmol/L (ref 22–32)
Calcium: 8.8 mg/dL — ABNORMAL LOW (ref 8.9–10.3)
Chloride: 99 mmol/L (ref 98–111)
Creatinine, Ser: 1.34 mg/dL — ABNORMAL HIGH (ref 0.61–1.24)
GFR, Estimated: 57 mL/min — ABNORMAL LOW (ref >60)
Glucose, Bld: 105 mg/dL — ABNORMAL HIGH (ref 70–99)
Potassium: 4.2 mmol/L (ref 3.5–5.1)
Sodium: 133 mmol/L — ABNORMAL LOW (ref 135–145)

## 2024-07-03 LAB — PATHOLOGIST SMEAR REVIEW

## 2024-07-03 LAB — IRON AND TIBC
Iron: 60 ug/dL (ref 45–182)
Saturation Ratios: 25 % (ref 17.9–39.5)
TIBC: 238 ug/dL — ABNORMAL LOW (ref 250–450)
UIBC: 178 ug/dL

## 2024-07-03 LAB — URINALYSIS, ROUTINE W REFLEX MICROSCOPIC
Bilirubin Urine: NEGATIVE
Glucose, UA: NEGATIVE mg/dL
Ketones, ur: NEGATIVE mg/dL
Nitrite: NEGATIVE
Protein, ur: 30 mg/dL — AB
Specific Gravity, Urine: 1.011 (ref 1.005–1.030)
pH: 6 (ref 5.0–8.0)

## 2024-07-03 LAB — PROTIME-INR
INR: 1.3 — ABNORMAL HIGH (ref 0.8–1.2)
Prothrombin Time: 16.6 s — ABNORMAL HIGH (ref 11.4–15.2)

## 2024-07-03 LAB — SARS CORONAVIRUS 2 BY RT PCR: SARS Coronavirus 2 by RT PCR: NEGATIVE

## 2024-07-03 LAB — FOLATE: Folate: 21.7 ng/mL (ref >5.9)

## 2024-07-03 LAB — VITAMIN B12: Vitamin B-12: 1162 pg/mL — ABNORMAL HIGH (ref 180–914)

## 2024-07-03 LAB — PREPARE RBC (CROSSMATCH)

## 2024-07-03 LAB — PHOSPHORUS: Phosphorus: 3.5 mg/dL (ref 2.5–4.6)

## 2024-07-03 LAB — LACTIC ACID, PLASMA: Lactic Acid, Venous: 1.6 mmol/L (ref 0.5–1.9)

## 2024-07-03 LAB — MAGNESIUM: Magnesium: 1.7 mg/dL (ref 1.7–2.4)

## 2024-07-03 MED ORDER — ADULT MULTIVITAMIN W/MINERALS CH
1.0000 | ORAL_TABLET | Freq: Every day | ORAL | Status: DC
  Administered 2024-07-03 – 2024-07-10 (×8): 1 via ORAL
  Filled 2024-07-03 (×9): qty 1

## 2024-07-03 MED ORDER — SODIUM CHLORIDE 0.9% IV SOLUTION: Freq: Once | INTRAVENOUS | Status: AC

## 2024-07-03 MED ORDER — OYSTER SHELL CALCIUM/D3 500-5 MG-MCG PO TABS
1.0000 | ORAL_TABLET | Freq: Every day | ORAL | Status: DC
  Administered 2024-07-04 – 2024-07-10 (×7): 1 via ORAL
  Filled 2024-07-03 (×7): qty 1

## 2024-07-03 MED ORDER — HYDRALAZINE HCL 20 MG/ML IJ SOLN: 10.0000 mg | INTRAMUSCULAR | Status: DC | PRN

## 2024-07-03 MED ORDER — ACETAMINOPHEN 650 MG RE SUPP
650.0000 mg | Freq: Four times a day (QID) | RECTAL | Status: DC | PRN
Start: 2024-07-03 — End: 2024-07-10

## 2024-07-03 MED ORDER — OXYCODONE HCL 5 MG PO TABS
5.0000 mg | ORAL_TABLET | ORAL | Status: DC | PRN
  Administered 2024-07-05: 5 mg via ORAL
  Filled 2024-07-03: qty 1

## 2024-07-03 MED ORDER — SODIUM CHLORIDE 0.9 % IV SOLN: INTRAVENOUS | Status: DC

## 2024-07-03 MED ORDER — ALLOPURINOL 300 MG PO TABS
300.0000 mg | ORAL_TABLET | Freq: Every day | ORAL | Status: DC
  Administered 2024-07-03 – 2024-07-09 (×7): 300 mg via ORAL
  Filled 2024-07-03 (×7): qty 1

## 2024-07-03 MED ORDER — ONDANSETRON HCL 4 MG/2ML IJ SOLN: 4.0000 mg | Freq: Four times a day (QID) | INTRAMUSCULAR | Status: DC | PRN

## 2024-07-03 MED ORDER — IPRATROPIUM BROMIDE 0.02 % IN SOLN: 0.5000 mg | Freq: Four times a day (QID) | RESPIRATORY_TRACT | Status: DC | PRN

## 2024-07-03 MED ORDER — FLEET ENEMA RE ENEM: 1.0000 | ENEMA | Freq: Once | RECTAL | Status: DC | PRN

## 2024-07-03 MED ORDER — TRAZODONE HCL 50 MG PO TABS
25.0000 mg | ORAL_TABLET | Freq: Every evening | ORAL | Status: DC | PRN
  Administered 2024-07-05 – 2024-07-07 (×3): 25 mg via ORAL
  Filled 2024-07-03 (×3): qty 1

## 2024-07-03 MED ORDER — SERTRALINE HCL 50 MG PO TABS
50.0000 mg | ORAL_TABLET | Freq: Every day | ORAL | Status: DC
Start: 2024-07-04 — End: 2024-07-10
  Administered 2024-07-04 – 2024-07-10 (×7): 50 mg via ORAL
  Filled 2024-07-03 (×7): qty 1

## 2024-07-03 MED ORDER — ONDANSETRON HCL 4 MG PO TABS: 4.0000 mg | ORAL_TABLET | Freq: Four times a day (QID) | ORAL | Status: DC | PRN

## 2024-07-03 MED ORDER — MIRTAZAPINE 15 MG PO TABS
15.0000 mg | ORAL_TABLET | Freq: Every day | ORAL | Status: DC
  Administered 2024-07-03 – 2024-07-09 (×7): 15 mg via ORAL
  Filled 2024-07-03 (×7): qty 1

## 2024-07-03 MED ORDER — ACETAMINOPHEN 325 MG PO TABS
650.0000 mg | ORAL_TABLET | Freq: Four times a day (QID) | ORAL | Status: DC | PRN
  Administered 2024-07-04 – 2024-07-10 (×11): 650 mg via ORAL
  Filled 2024-07-03 (×11): qty 2

## 2024-07-03 MED ORDER — POLYETHYLENE GLYCOL 3350 17 G PO PACK
17.0000 g | PACK | Freq: Every day | ORAL | Status: DC
  Administered 2024-07-03 – 2024-07-09 (×6): 17 g via ORAL
  Filled 2024-07-03 (×9): qty 1

## 2024-07-03 MED ORDER — ACETAMINOPHEN 325 MG PO TABS
650.0000 mg | ORAL_TABLET | Freq: Once | ORAL | Status: AC
  Administered 2024-07-03: 650 mg via ORAL
  Filled 2024-07-03: qty 2

## 2024-07-03 MED ORDER — VITAMIN B-12 1000 MCG PO TABS
1000.0000 ug | ORAL_TABLET | Freq: Every day | ORAL | Status: DC
  Administered 2024-07-04 – 2024-07-10 (×7): 1000 ug via ORAL
  Filled 2024-07-03 (×7): qty 1

## 2024-07-03 MED ORDER — SODIUM CHLORIDE 0.9% FLUSH
3.0000 mL | Freq: Two times a day (BID) | INTRAVENOUS | Status: DC
  Administered 2024-07-03 – 2024-07-10 (×14): 3 mL via INTRAVENOUS

## 2024-07-03 MED ORDER — HYDROMORPHONE HCL 1 MG/ML IJ SOLN: 0.5000 mg | INTRAMUSCULAR | Status: DC | PRN

## 2024-07-03 MED ORDER — PANTOPRAZOLE SODIUM 40 MG PO TBEC
40.0000 mg | DELAYED_RELEASE_TABLET | Freq: Two times a day (BID) | ORAL | Status: DC
  Administered 2024-07-03 – 2024-07-10 (×14): 40 mg via ORAL
  Filled 2024-07-03 (×15): qty 1

## 2024-07-03 MED ORDER — SODIUM CHLORIDE 0.9% FLUSH
3.0000 mL | Freq: Two times a day (BID) | INTRAVENOUS | Status: DC
  Administered 2024-07-03 – 2024-07-10 (×15): 3 mL via INTRAVENOUS

## 2024-07-03 MED ORDER — SENNOSIDES-DOCUSATE SODIUM 8.6-50 MG PO TABS: 1.0000 | ORAL_TABLET | Freq: Every evening | ORAL | Status: DC | PRN

## 2024-07-03 MED ORDER — BISACODYL 5 MG PO TBEC: 5.0000 mg | DELAYED_RELEASE_TABLET | Freq: Every day | ORAL | Status: DC | PRN

## 2024-07-03 MED ORDER — LEVALBUTEROL HCL 0.63 MG/3ML IN NEBU: 0.6300 mg | INHALATION_SOLUTION | Freq: Four times a day (QID) | RESPIRATORY_TRACT | Status: DC | PRN

## 2024-07-03 MED ORDER — HEPARIN SODIUM (PORCINE) 5000 UNIT/ML IJ SOLN: 5000.0000 [IU] | Freq: Three times a day (TID) | INTRAMUSCULAR | Status: DC

## 2024-07-03 NOTE — ED Provider Notes (Signed)
 [place] EMERGENCY DEPARTMENT AT [facility] Provider Note   CSN: [id number] Arrival date & time: 07/03/24  1041     Patient presents with: Anemia   Ricardo [name] is a 73 y.o. male.   73 year old male presenting with anemia, sent from [place] for blood transfusion.  Patient has history of myelofibrosis resulting in chronic anemia, had blood work done at the [place] today which showed hemoglobin of 6.3.  Patient was here with similar complaint 7/3 where he received 1 unit of blood.  Patient reports he is generally feeling well, he did feel a bit weaker than normal a few days ago, but denies chest pain, shortness of breath, syncope, melena/hematochezia, hematuria.  Denies blood thinners but does take baby aspirin daily.   Anemia       Prior to Admission medications   Medication Sig Start Date End Date Taking? Authorizing Provider  acetaminophen  (TYLENOL ) 325 MG tablet Take 650 mg by mouth every 6 (six) hours as needed for moderate pain.    [provider]  allopurinol  (ZYLOPRIM ) 300 MG tablet Take 300 mg by mouth at bedtime. 10/12/22   [provider]  aspirin EC 81 MG tablet Take 81 mg by mouth daily.    [provider]  aspirin-sod bicarb-citric acid (ALKA-SELTZER) 325 MG TBEF tablet Take 325 mg by mouth every 6 (six) hours as needed (upset stomach).    [provider]  calcium -vitamin D (OSCAL WITH D) 500-200 MG-UNIT tablet Take 1 tablet by mouth.    [provider]  cyanocobalamin (VITAMIN B12) 500 MCG tablet Take 2 tablets by mouth daily. 07/26/23   [provider]  diclofenac Sodium (VOLTAREN) 1 % GEL Apply 4 g topically 3 (three) times daily as needed (pain). 07/26/23   [provider]  fluticasone (FLONASE) 50 MCG/ACT nasal spray Place 2 sprays into both nostrils daily. 07/26/23   [provider]  LATANOPROST OP Apply 1 drop to eye at bedtime.    [provider]  loratadine (CLARITIN) 10 MG tablet  Take 10 mg by mouth daily.    [provider]  methocarbamol (ROBAXIN) 500 MG tablet Take 500 mg by mouth in the morning and at bedtime.    [provider]  mirtazapine  (REMERON ) 15 MG tablet Take 15 mg by mouth at bedtime.    [provider]  Multiple Vitamin (MULTIVITAMIN) capsule Take by mouth.    [provider]  pantoprazole  (PROTONIX ) 40 MG tablet Take 40 mg by mouth daily. As needed    [provider]  polyethylene glycol (MIRALAX  / GLYCOLAX ) 17 g packet Take 17 g by mouth daily.    [provider]  sertraline  (ZOLOFT ) 50 MG tablet Take 50 mg by mouth daily.    [provider]  traMADol (ULTRAM) 50 MG tablet Take 1 tablet by mouth 2 (two) times daily as needed. 07/26/23   [provider]    Allergies: Patient has no known allergies.    Review of Systems  Updated Vital Signs  Vitals:   07/03/24 1415 07/03/24 1439 07/03/24 1500 07/03/24 1502  BP: (!) 117/55 (!) 114/55 118/64 115/61  Pulse: 72 71 69 69  Resp: (!) 23 19 14 18   Temp:  99.1 F (37.3 C)  99 F (37.2 C)  TempSrc:  Oral  Oral  SpO2: 95% 96% 97% 98%  Weight:      Height:         Physical Exam Vitals and nursing note reviewed.  HENT:     Head: Normocephalic.  Eyes:     Extraocular Movements: Extraocular movements intact.  Cardiovascular:     Rate and Rhythm: Normal rate and regular rhythm.  Pulmonary:     Effort: Pulmonary effort is normal.     Breath sounds: Normal breath sounds.  Abdominal:     Palpations: Abdomen is soft.     Tenderness: There is no abdominal tenderness. There is no guarding.  Musculoskeletal:     Cervical back: Normal range of motion.     Comments: Moves all extremities spontaneously without difficulty  Skin:    General: Skin is warm and dry.  Neurological:     Mental Status: He is alert and oriented to person, place, and time.     (all labs ordered are listed, but only abnormal results are displayed) Labs  Reviewed  CBC WITH DIFFERENTIAL/PLATELET - Abnormal; Notable for the following components:      Result Value   RBC 1.93 (*)    Hemoglobin 5.9 (*)    HCT 20.3 (*)    MCV 105.2 (*)    MCHC 29.1 (*)    RDW 23.1 (*)    nRBC 5.0 (*)    nRBC 5 (*)    Abs Immature Granulocytes 0.30 (*)    All other components within normal limits  BASIC METABOLIC PANEL WITH GFR - Abnormal; Notable for the following components:   Sodium 133 (*)    Glucose, Bld 105 (*)    Creatinine, Ser 1.34 (*)    Calcium  8.8 (*)    GFR, Estimated 57 (*)    All other components within normal limits  SARS CORONAVIRUS 2 BY RT PCR  CULTURE, BLOOD (ROUTINE X 2)  CULTURE, BLOOD (ROUTINE X 2)  EXPECTORATED SPUTUM ASSESSMENT W GRAM STAIN, RFLX TO RESP C  PATHOLOGIST SMEAR REVIEW  LACTIC ACID, PLASMA  URINALYSIS, ROUTINE W REFLEX MICROSCOPIC  OCCULT BLOOD X 1 CARD TO LAB, STOOL  IRON AND TIBC  VITAMIN B12  FOLATE  HIV ANTIBODY (ROUTINE TESTING W REFLEX)  MAGNESIUM  PHOSPHORUS  PROTIME-INR  HEMOGLOBIN AND HEMATOCRIT, BLOOD  HEMOGLOBIN AND HEMATOCRIT, BLOOD  HEMOGLOBIN AND HEMATOCRIT, BLOOD  TYPE AND SCREEN  PREPARE RBC (CROSSMATCH)    EKG: None  Radiology: DG Chest Portable 1 View Result Date: 07/03/2024 CLINICAL DATA:  fever EXAM: PORTABLE CHEST - 1 VIEW COMPARISON:  None available. FINDINGS: No focal airspace consolidation, pleural effusion, or pneumothorax. No cardiomegaly. No acute fracture or destructive lesion. IMPRESSION: No acute cardiopulmonary abnormality. Electronically Signed   By: Rogelia Myers M.D.   On: 07/03/2024 14:32     .Critical Care  Performed by: Glendia Rocky SAILOR, PA-C Authorized by: Glendia Rocky SAILOR, PA-C   Critical care provider statement:    Critical care time (minutes):  42   Critical care time was exclusive of:  Separately billable procedures and treating other patients and teaching time   Critical care was necessary to treat or prevent imminent or life-threatening deterioration of the  following conditions: Symptomatic anemia requiring blood transfusion.   Critical care was time spent personally by me on the following activities:  Ordering and performing treatments and interventions, development of treatment plan with patient or surrogate, ordering and review of laboratory studies, ordering and review of radiographic studies, evaluation of patient's response to treatment, re-evaluation of patient's condition, examination of patient and review of old charts   I assumed direction of critical care for this patient from another provider in my specialty: no  Care discussed with: admitting provider      Medications Ordered in the ED - No data to display                                  Medical Decision Making This patient presents to the ED for concern of anemia, this involves an extensive number of treatment options, and is a complaint that carries with it a high risk of complications and morbidity.  The differential diagnosis includes anemia secondary to myelofibrosis, anemia secondary to blood loss, anemia of chronic disease, pneumonia, urinary tract infection   Co morbidities that complicate the patient evaluation  History of myelofibrosis, recent history of blood transfusion   Additional history obtained:  Additional history obtained from review External records from outside source obtained and reviewed including recent oncology note from the [place] 7/3   Lab Tests:  I Ordered, and personally interpreted labs.  The pertinent results include: CBC notable for hemoglobin of 5.9, this is reduced from his most recent emergency department visit 2 weeks ago where his hemoglobin was 6.8 prior to 1 unit of PRBC infusion.  BMP notable for mild hyponatremia with sodium of 133, creatinine of 1.3 is elevated as compared to value from 2 weeks ago however does seem consistent with prior creatinine readings.  Lactic 1.6.  Urinalysis pending at time of hospital admission   Imaging  Studies ordered:  I ordered imaging studies including chest x-ray I independently visualized and interpreted imaging which showed no acute cardiopulmonary abnormalities I agree with the radiologist interpretation   Cardiac Monitoring: / EKG:  The patient was maintained on a cardiac monitor.  I personally viewed and interpreted the cardiac monitored which showed an underlying rhythm of: NSR   Consultations Obtained:  I requested consultation with the hospitalist,  and discussed lab and imaging findings as well as pertinent plan - they recommend: spoke with Dr. Willette who agrees that this patient is appropriate for admission   Problem List / ED Course / Critical interventions / Medication management  I ordered medication including Tylenol  for fever, 2 units PRBCs for anemia Reevaluation of the patient after these medicines showed that the patient improved I have reviewed the patients home medicines and have made adjustments as needed   Social Determinants of Health:  Smokeless tobacco use   Test / Admission - Considered:  Physical exam notable as above.  Patient was found to be febrile with a rectal temperature of 100.6, therefore I did order lactic, blood cultures, urinalysis to assess for underlying infectious etiology.  Patient has no complaints and generally feels in his normal state of health aside from some subjective weakness several days ago, I suspect this is likely secondary to symptomatic anemia.  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 Haroldine. Spoke with the hospitalist as above who agrees that this patient is appropriate for admission.    Amount and/or Complexity of Data Reviewed Labs: ordered. Radiology: ordered.  Risk OTC drugs. Prescription drug management. Decision regarding hospitalization.        Final  diagnoses:  Symptomatic anemia    ED Discharge Orders     None          Glendia Rocky LOISE DEVONNA 07/03/24 1608    Cleotilde Rogue, MD 07/04/24 1728

## 2024-07-03 NOTE — Assessment & Plan Note (Signed)
 Continue PPI.

## 2024-07-03 NOTE — Hospital Course (Addendum)
 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  [facility], follows closely with his PCP with frequent blood transfusion.  Apparently had a blood transfusion on 06/19/2024 with 1 units of PRBC.  Not much is available in electronic records, his hemoglobin seems to be running around 7.   He has not reported of any chest pain, shortness of breath, syncope, melena, hematochezia, hematuria.  ED evaluation: Blood pressure 115/61, pulse 69, temperature 99 F (37.2 C), temperature source Oral, resp. rate 18, height 6' 1 (1.854 m), weight 69.5 kg, SpO2 98%.  CBC: Hemoglobin 5.9 (on 06/19/2024 hemoglobin was 6.8) hematocrit 20.3, MCV 105.2, RDW 23.1, BMP sodium 133, glucose 105, creatinine 1.34, GFR 57  Per EDP: Initiated on 2 units of PRBC blood transfusion  Requested patient to be admitted for close observation.  Note: On arrival Tmax 100.7, no signs of infection, lactic acid 1.0, WBC of 4.0, respiratory panel negative.

## 2024-07-03 NOTE — ED Triage Notes (Signed)
 Pt arrived via POV c/o low Hgb of 6.3. Pt presents from PCP appointment and was advised to go to the ER for a blood transfusion. Pt presents NAD.

## 2024-07-03 NOTE — Assessment & Plan Note (Addendum)
-   Stable, continue home medication of Remeron  and Zoloft

## 2024-07-03 NOTE — H&P (Signed)
 History and Physical   Patient: Ricardo [name]                            PCP: [facility] [place]                    DOB: 1951/07/05            DOA: 07/03/2024 FMW:[id number]             DOS: 07/03/2024, 3:34 PM  [facility] [place]  Patient coming from:   HOME  I have personally reviewed patient's medical records, in electronic medical records, including:  [place] link, and care everywhere.    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  [facility] is a [facility] patient, follows closely with his PCP with frequent blood transfusion.  Apparently had a blood transfusion on 06/19/2024 with 1 units of PRBC.  Not much is available in electronic records, his hemoglobin seems to be running around 7.   He has not reported of any chest pain, shortness of breath, syncope, melena, hematochezia, hematuria.  ED evaluation: Blood pressure 115/61, pulse 69, temperature 99 F (37.2 C), temperature source Oral, resp. rate 18, height 6' 1 (1.854 m), weight 69.5 kg, SpO2 98%.  CBC: Hemoglobin 5.9 (on 06/19/2024 hemoglobin was 6.8) hematocrit 20.3, MCV 105.2, RDW 23.1, BMP sodium 133, glucose 105, creatinine 1.34, GFR 57  Per EDP: Initiated on 2 units of PRBC blood transfusion  Requested patient to be admitted for close observation.  Note: On arrival Tmax 100.7, no signs of infection, lactic acid 1.0, WBC of 4.0, respiratory panel negative.   Patient Denies having: Fever, Chills, Cough, SOB, Chest Pain, Abd pain, N/V/D, headache, dizziness, lightheadedness,  Dysuria, Joint pain, rash, open wounds    Review of Systems: As per HPI, otherwise 10 point review of systems were negative.    ----------------------------------------------------------------------------------------------------------------------  No Known Allergies  Home MEDs:  Prior to Admission medications   Medication Sig Start Date End Date Taking? Authorizing Provider  allopurinol  (ZYLOPRIM ) 300 MG tablet Take 300 mg by mouth at bedtime. 10/12/22   [provider]  calcium -vitamin D (OSCAL WITH D) 500-200 MG-UNIT tablet Take 1 tablet by mouth.    [provider]  cyanocobalamin (VITAMIN B12) 500 MCG tablet Take 2 tablets by mouth daily. 07/26/23   [provider]  fluticasone (FLONASE) 50 MCG/ACT nasal spray Place 2 sprays into both nostrils daily. 07/26/23   [provider]  LATANOPROST OP Apply 1 drop to eye at bedtime.    [provider]  methocarbamol (ROBAXIN) 500 MG tablet Take 500 mg by mouth in the morning and at bedtime.    [provider]  mirtazapine  (REMERON ) 15 MG tablet Take 15 mg by mouth at bedtime.    [provider]  Multiple Vitamin (MULTIVITAMIN) capsule Take by mouth.    [provider]  pantoprazole  (PROTONIX ) 40 MG tablet Take 40 mg by mouth daily as needed (heartburn, acid reflux).    [provider]  polyethylene glycol (MIRALAX  / GLYCOLAX ) 17 g packet Take 17 g by mouth daily.  [provider]  sertraline  (ZOLOFT ) 50 MG tablet Take 50 mg by mouth daily.    [provider]    PRN MEDs: acetaminophen  **OR** acetaminophen , bisacodyl , hydrALAZINE , HYDROmorphone  (DILAUDID ) injection, ipratropium, levalbuterol , ondansetron  **OR** ondansetron  (ZOFRAN ) IV, oxyCODONE , senna-docusate, sodium phosphate, traZODone   Past Medical History:  Diagnosis Date   Alcohol abuse    Anemia    Arthritis    Carpal tunnel syndrome    Gout    History of radiation therapy 04/10/2017-06/04/2017   Site/dose:   The prostatic fossa was treated to 68.4 Gy in 38 fractions of 1.8 Gy   Myelofibrosis (HCC)     Prostate cancer [facility])     Past Surgical History:  Procedure Laterality Date   COLONOSCOPY WITH PROPOFOL  N/A 09/10/2023   Procedure: COLONOSCOPY WITH PROPOFOL ;  Surgeon: Cindie Carlin POUR, DO;  [facility];  Service: Endoscopy;  Laterality: N/A;  11:30 am, asa 3   CYSTECTOMY     left bicept   POLYPECTOMY  09/10/2023   Procedure: POLYPECTOMY;  Surgeon: Cindie Carlin POUR, DO;  Location: [facility];  Service: Endoscopy;;   PROSTATECTOMY  2014   TENDON REPAIR     right thumb     reports that he has never smoked. He has never been exposed to tobacco smoke. His smokeless tobacco use includes snuff. He reports that he does not currently use alcohol. He reports that he does not use drugs.   Family History  Problem Relation Age of Onset   Heart disease Mother    Hypertension Sister    Cancer Maternal Aunt        breast   Cancer Maternal Uncle        colon   Cancer Maternal Uncle        lung    Physical Exam:   Vitals:   07/03/24 1415 07/03/24 1439 07/03/24 1500 07/03/24 1502  BP: (!) 117/55 (!) 114/55 118/64 115/61  Pulse: 72 71 69 69  Resp: (!) 23 19 14 18   Temp:  99.1 F (37.3 C)  99 F (37.2 C)  TempSrc:  Oral  Oral  SpO2: 95% 96% 97% 98%  Weight:      Height:       Constitutional: NAD, calm, comfortable Eyes: PERRL, lids and conjunctivae normal ENMT: Mucous membranes are moist. Posterior pharynx clear of any exudate or lesions.Normal dentition.  Neck: normal, supple, no masses, no thyromegaly Respiratory: clear to auscultation bilaterally, no wheezing, no crackles. Normal respiratory effort. No accessory muscle use.  Cardiovascular: Regular rate and rhythm, no murmurs / rubs / gallops. No extremity edema. 2+ pedal pulses. No carotid bruits.  Abdomen: no tenderness, no masses palpated. No hepatosplenomegaly. Bowel sounds positive.  Musculoskeletal: no clubbing / cyanosis. No joint deformity upper and lower extremities. Good ROM, no contractures. Normal  muscle tone.  Neurologic: CN II-XII grossly intact. Sensation intact, DTR normal. Strength 5/5 in all 4.  Psychiatric: Normal judgment and insight. Alert and oriented x 3. Normal mood.  Skin: no rashes, lesions, ulcers. No induration    Labs on admission:    I have personally reviewed following labs and imaging studies  CBC: Recent Labs  Lab 07/03/24 1109  WBC 4.0  NEUTROABS 2.2  HGB 5.9*  HCT 20.3*  MCV 105.2*  PLT 336   Basic Metabolic Panel: Recent Labs  Lab 07/03/24 1109  NA 133*  K 4.2  CL 99  CO2 23  GLUCOSE 105*  BUN 21  CREATININE 1.34*  CALCIUM   8.8*    Last A1C:  No results found for: HGBA1C   Radiologic Exams on Admission:   DG Chest Portable 1 View Result Date: 07/03/2024 CLINICAL DATA:  fever EXAM: PORTABLE CHEST - 1 VIEW COMPARISON:  None available. FINDINGS: No focal airspace consolidation, pleural effusion, or pneumothorax. No cardiomegaly. No acute fracture or destructive lesion. IMPRESSION: No acute cardiopulmonary abnormality. Electronically Signed   By: Rogelia Myers M.D.   On: 07/03/2024 14:32    EKG:   Independently reviewed.  Orders placed or performed during the hospital encounter of 07/03/24   EKG 12-Lead   ---------------------------------------------------------------------------------------------------------------------------------------    Assessment / Plan:   Principal Problem:   Symptomatic anemia Active Problems:   Gout, unspecified   Myelofibrosis (HCC)   Iron deficiency anemia due to chronic blood loss   CKD (chronic kidney disease) stage 3, GFR 30-59 ml/min (HCC)   Depression   GERD (gastroesophageal reflux disease)   Assessment and Plan: GERD (gastroesophageal reflux disease) - Continue PPI  Depression - Stable, continue home medication of Remeron  and Zoloft   CKD (chronic kidney disease) stage 3, GFR 30-59 ml/min (HCC) BUN/creatinine at baseline Lab Results  Component Value Date   CREATININE 1.34  (H) 07/03/2024   CREATININE 1.13 06/19/2024   CREATININE 1.38 (H) 09/27/2023   Estimated Creatinine Clearance: 50.4 mL/min (A) (by C-G formula based on SCr of 1.34 mg/dL (H)). 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 Haroldine through the [facility]  - With recent blood transfusion on 06/19/2024 1 unit  Gout, unspecified - Flareup, reviewing medication we will continue current - Continue allopurinol       Consults called:  none  -------------------------------------------------------------------------------------------------------------------------------------------- DVT prophylaxis:  heparin  injection 5,000 Units Start: 07/03/24 1515 TED hose Start: 07/03/24 1507 SCDs Start: 07/03/24 1507   Code Status:   Code Status: Full Code   Admission status: Patient will be admitted as Observation, with a greater than 2 midnight length of stay. Level of care: Telemetry   Family Communication:  none at bedside  (The above findings and plan of care has been discussed with patient in detail, the patient expressed understanding and agreement of above plan)  --------------------------------------------------------------------------------------------------------------------------------------------------  Disposition Plan:  Anticipated 1-2 days Status is: Observation The patient remains OBS appropriate and will d/c before 2 midnights.  ----------------------------------------------------------------------------------------------------------------------------------------------------  Time spent:  75  Min.  Was spent seeing and evaluating the patient, reviewing all  medical records, drawn plan of care.  SIGNED: Adriana DELENA Grams, MD, FHM. FAAFP. [place] [facility], Pager  (Please use [email or web address] to page/ or secure chat through epic) If 7PM-7AM, please contact night-coverage [email or web address],  07/03/2024, 3:34 PM

## 2024-07-03 NOTE — Assessment & Plan Note (Signed)
 BUN/creatinine at baseline Lab Results  Component Value Date   CREATININE 1.34 (H) 07/03/2024   CREATININE 1.13 06/19/2024   CREATININE 1.38 (H) 09/27/2023   Estimated Creatinine Clearance: 50.4 mL/min (A) (by C-G formula based on SCr of 1.34 mg/dL (H)). - Avoid nephrotoxins -Transfusing blood will improve kidney function

## 2024-07-03 NOTE — Assessment & Plan Note (Signed)
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 Haroldine through the [facility]  - With recent blood transfusion on 06/19/2024 1 unit

## 2024-07-03 NOTE — Assessment & Plan Note (Addendum)
-   Flareup, reviewing medication we will continue current - Continue allopurinol

## 2024-07-03 NOTE — ED Notes (Signed)
 Pt laying in bed with family at bedside. Upon assessing pt, lung sounds were clear bilat, no complaints were voiced by pt. Pt's GI sounds were present in all 4 quadrants. Heart sounds regular.

## 2024-07-03 NOTE — Assessment & Plan Note (Signed)
-   Rechecking iron level -Anticipate initiating iron supplements

## 2024-07-04 DIAGNOSIS — N1831 Chronic kidney disease, stage 3a: Secondary | ICD-10-CM | POA: Diagnosis present

## 2024-07-04 DIAGNOSIS — M109 Gout, unspecified: Secondary | ICD-10-CM | POA: Diagnosis present

## 2024-07-04 DIAGNOSIS — Z9079 Acquired absence of other genital organ(s): Secondary | ICD-10-CM | POA: Diagnosis not present

## 2024-07-04 DIAGNOSIS — Z923 Personal history of irradiation: Secondary | ICD-10-CM | POA: Diagnosis not present

## 2024-07-04 DIAGNOSIS — Z8546 Personal history of malignant neoplasm of prostate: Secondary | ICD-10-CM | POA: Diagnosis not present

## 2024-07-04 DIAGNOSIS — Z681 Body mass index (BMI) 19 or less, adult: Secondary | ICD-10-CM | POA: Diagnosis not present

## 2024-07-04 DIAGNOSIS — Z8249 Family history of ischemic heart disease and other diseases of the circulatory system: Secondary | ICD-10-CM | POA: Diagnosis not present

## 2024-07-04 DIAGNOSIS — R17 Unspecified jaundice: Secondary | ICD-10-CM | POA: Diagnosis present

## 2024-07-04 DIAGNOSIS — R8281 Pyuria: Secondary | ICD-10-CM | POA: Diagnosis present

## 2024-07-04 DIAGNOSIS — Z7982 Long term (current) use of aspirin: Secondary | ICD-10-CM | POA: Diagnosis not present

## 2024-07-04 DIAGNOSIS — Z1152 Encounter for screening for COVID-19: Secondary | ICD-10-CM | POA: Diagnosis not present

## 2024-07-04 DIAGNOSIS — Z79899 Other long term (current) drug therapy: Secondary | ICD-10-CM | POA: Diagnosis not present

## 2024-07-04 DIAGNOSIS — R636 Underweight: Secondary | ICD-10-CM | POA: Diagnosis present

## 2024-07-04 DIAGNOSIS — D500 Iron deficiency anemia secondary to blood loss (chronic): Secondary | ICD-10-CM | POA: Diagnosis present

## 2024-07-04 DIAGNOSIS — F32A Depression, unspecified: Secondary | ICD-10-CM | POA: Diagnosis present

## 2024-07-04 DIAGNOSIS — D649 Anemia, unspecified: Secondary | ICD-10-CM | POA: Diagnosis present

## 2024-07-04 DIAGNOSIS — R5081 Fever presenting with conditions classified elsewhere: Secondary | ICD-10-CM | POA: Diagnosis present

## 2024-07-04 DIAGNOSIS — D471 Chronic myeloproliferative disease: Secondary | ICD-10-CM | POA: Diagnosis present

## 2024-07-04 DIAGNOSIS — K219 Gastro-esophageal reflux disease without esophagitis: Secondary | ICD-10-CM | POA: Diagnosis present

## 2024-07-04 DIAGNOSIS — D638 Anemia in other chronic diseases classified elsewhere: Secondary | ICD-10-CM | POA: Diagnosis present

## 2024-07-04 DIAGNOSIS — R8271 Bacteriuria: Secondary | ICD-10-CM | POA: Diagnosis present

## 2024-07-04 DIAGNOSIS — M436 Torticollis: Secondary | ICD-10-CM | POA: Diagnosis present

## 2024-07-04 DIAGNOSIS — Z720 Tobacco use: Secondary | ICD-10-CM | POA: Diagnosis not present

## 2024-07-04 DIAGNOSIS — D709 Neutropenia, unspecified: Secondary | ICD-10-CM | POA: Diagnosis present

## 2024-07-04 LAB — CBC
HCT: 25.7 % — ABNORMAL LOW (ref 39.0–52.0)
HCT: 28.9 % — ABNORMAL LOW (ref 39.0–52.0)
Hemoglobin: 8.2 g/dL — ABNORMAL LOW (ref 13.0–17.0)
Hemoglobin: 9.1 g/dL — ABNORMAL LOW (ref 13.0–17.0)
MCH: 30.9 pg (ref 26.0–34.0)
MCH: 30.5 pg (ref 26.0–34.0)
MCHC: 31.9 g/dL (ref 30.0–36.0)
MCHC: 31.5 g/dL (ref 30.0–36.0)
MCV: 97 fL (ref 80.0–100.0)
MCV: 97 fL (ref 80.0–100.0)
Platelets: 278 K/uL (ref 150–400)
Platelets: 338 K/uL (ref 150–400)
RBC: 2.65 MIL/uL — ABNORMAL LOW (ref 4.22–5.81)
RBC: 2.98 MIL/uL — ABNORMAL LOW (ref 4.22–5.81)
RDW: 22.8 % — ABNORMAL HIGH (ref 11.5–15.5)
RDW: 23.2 % — ABNORMAL HIGH (ref 11.5–15.5)
WBC: 3.3 K/uL — ABNORMAL LOW (ref 4.0–10.5)
WBC: 4.7 K/uL (ref 4.0–10.5)
nRBC: 3.9 % — ABNORMAL HIGH (ref 0.0–0.2)
nRBC: 2.3 % — ABNORMAL HIGH (ref 0.0–0.2)

## 2024-07-04 LAB — CBC WITH DIFFERENTIAL/PLATELET
Abs Immature Granulocytes: 0.2 K/uL — ABNORMAL HIGH (ref 0.00–0.07)
Band Neutrophils: 2 %
Basophils Absolute: 0 K/uL (ref 0.0–0.1)
Basophils Relative: 0 %
Blasts: 1 %
Eosinophils Absolute: 0 K/uL (ref 0.0–0.5)
Eosinophils Relative: 1 %
HCT: 25.9 % — ABNORMAL LOW (ref 39.0–52.0)
Hemoglobin: 8.2 g/dL — ABNORMAL LOW (ref 13.0–17.0)
Lymphocytes Relative: 29 %
Lymphs Abs: 1 K/uL (ref 0.7–4.0)
MCH: 30.5 pg (ref 26.0–34.0)
MCHC: 31.7 g/dL (ref 30.0–36.0)
MCV: 96.3 fL (ref 80.0–100.0)
Metamyelocytes Relative: 5 %
Monocytes Absolute: 0.2 K/uL (ref 0.1–1.0)
Monocytes Relative: 7 %
Neutro Abs: 1.9 K/uL (ref 1.7–7.7)
Neutrophils Relative %: 53 %
Platelets: 285 K/uL (ref 150–400)
Promyelocytes Relative: 2 %
RBC: 2.69 MIL/uL — ABNORMAL LOW (ref 4.22–5.81)
RDW: 22.8 % — ABNORMAL HIGH (ref 11.5–15.5)
WBC: 3.4 K/uL — ABNORMAL LOW (ref 4.0–10.5)
nRBC: 4.7 % — ABNORMAL HIGH (ref 0.0–0.2)

## 2024-07-04 LAB — COMPREHENSIVE METABOLIC PANEL WITH GFR
ALT: 16 U/L (ref 0–44)
AST: 39 U/L (ref 15–41)
Albumin: 3.3 g/dL — ABNORMAL LOW (ref 3.5–5.0)
Alkaline Phosphatase: 77 U/L (ref 38–126)
Anion gap: 9 (ref 5–15)
BUN: 19 mg/dL (ref 8–23)
CO2: 22 mmol/L (ref 22–32)
Calcium: 8.4 mg/dL — ABNORMAL LOW (ref 8.9–10.3)
Chloride: 105 mmol/L (ref 98–111)
Creatinine, Ser: 1.23 mg/dL (ref 0.61–1.24)
GFR, Estimated: >60 mL/min (ref >60)
Glucose, Bld: 100 mg/dL — ABNORMAL HIGH (ref 70–99)
Potassium: 4.1 mmol/L (ref 3.5–5.1)
Sodium: 136 mmol/L (ref 135–145)
Total Bilirubin: 2.5 mg/dL — ABNORMAL HIGH (ref 0.0–1.2)
Total Protein: 6.3 g/dL — ABNORMAL LOW (ref 6.5–8.1)

## 2024-07-04 LAB — BPAM RBC
Blood Product Expiration Date: 202507232359
Blood Product Expiration Date: 202508092359
ISSUE DATE / TIME: 202507171435
ISSUE DATE / TIME: 202507171802
Unit Type and Rh: 9500
Unit Type and Rh: 9500

## 2024-07-04 LAB — TYPE AND SCREEN
ABO/RH(D): O NEG
Antibody Screen: NEGATIVE
Unit division: 0
Unit division: 0

## 2024-07-04 LAB — APTT: aPTT: 41 s — ABNORMAL HIGH (ref 24–36)

## 2024-07-04 LAB — HIV ANTIBODY (ROUTINE TESTING W REFLEX): HIV Screen 4th Generation wRfx: NONREACTIVE

## 2024-07-04 MED ORDER — SODIUM CHLORIDE 0.9 % IV SOLN
2.0000 g | INTRAVENOUS | Status: DC
  Administered 2024-07-04 – 2024-07-07 (×4): 2 g via INTRAVENOUS
  Filled 2024-07-04 (×4): qty 20

## 2024-07-04 NOTE — Progress Notes (Signed)
 [facility] PROGRESS NOTE  Ricardo [name] (DOB: 19-Mar-1951) FMW:[id number] PCP: [facility]  Brief Narrative: Ricardo [name] is a 73 y.o. male with a history of myelofibrosis with transfusion-dependent anemia, GERD, depression, and gout who presented to the ED on 07/03/2024 at the advice of his PCP due to anemia, hgb 6.3g/dl as an outpatient. Last transfusion was 1u RBCs on 7/3. In the ED, anemia was confirmed, hgb 5.9g/dl and 2u RBCs were ordered. Vital signs initially stable, though he developed a significant fever to 102.36F shortly after admission for which work up was done including blood cultures, UA and urine culture, sputum culture. UA revealed bacteriuria and pyuria, and patient has some suprapubic tenderness. WBC has gone 4 > 3.4k and fevers continue. Ceftriaxone is started and the patient will be monitored for development of sepsis.   Subjective: No bleeding, denies any other complaints at this time. Denies urinary frequency, urgency, hesitancy, or burning.   Objective: BP 129/64 (BP Location: Right Arm)   Pulse 74   Temp (!) 100.4 F (38 C) (Oral)   Resp 15   Ht 6' 1 (1.854 m)   Wt 64.3 kg   SpO2 97%   BMI 18.70 kg/m   Gen: No distress Pulm: Clear, nonlabored  CV: RRR, no MRG, no edema GI: Soft, +tenderness in midline lower abdomen/suprapubic region without tenderness elsewhere, nondistended +BS Neuro: Alert and oriented. No new focal deficits. Ext: Warm, no deformities Skin: No rashes, lesions or ulcers on visualized skin   Assessment & Plan: Fever, concern for UTI: Note covid PCR negative, CXR clear, no cutaneous or other sources of infection noted at this time. Fever preceded transfusions and is ongoing long after transfusions completed.  - Start ceftriaxone in light of signs (suprapubic tenderness, bacteriuria, pyuria) and ongoing fevers with high risk of rapid deterioration due to myelofibrosis/leukopenia, also left shift noted on smear.  - Continue  monitoring blood cultures - Taking adequate po, normotensive, lactic acid normal. will stop further IVF at this time.   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  - s/p 2u RBCs 7/17, hgb up as anticipated 5.9 > 8.2 thus far. Will continue monitoring.  - No evidence of active bleeding, iron panel shows low TIBC.  - Continue follow up with Dr. Haroldine at the [facility], on luspatercept  q3 weeks.  - Bilirubin elevated with otherwise normal LFTs, smear shows many abnormalities but no schistocytes.   Stage IIIa CKD: Stable - Avoid nephrotoxins  Depression: Quiescent.  - Continue SSRI, remeron   GERD:  - Continue PPI  Gout:  - Continue allopurinol .   [name], MD [facility] [email or web address] 07/04/2024, 1:24 PM

## 2024-07-04 NOTE — Progress Notes (Signed)
   07/04/24 1145  TOC Brief Assessment  Insurance and Status Reviewed  Patient has primary care physician Yes  Home environment has been reviewed Single family home  Prior level of function: Independent  Prior/Current Home Services No current home services  Social Drivers of Health Review SDOH reviewed no interventions necessary  Readmission risk has been reviewed Yes  Transition of care needs no transition of care needs at this time   [place] notified of pts hospital admission. [facility] notification ID is [id number].

## 2024-07-04 NOTE — Progress Notes (Signed)
 Patients temp was 99.0 after final unit of blood administered at 2100 lastnite, 3 hours later patients temp rose to 102.2 at midnight, MD contacted and tylenol  650mg  administered. Temp recheck done at 0230 and temp was 100.5, MD contacted and cool rags placed on patient and extra blankets removed, temp rechecked at 0400 and temp dropped to 98.7. After 2 units of blood administered, patients hgb is now 8.7 patient slept well throughout the night. Family present at bedside.

## 2024-07-04 NOTE — Plan of Care (Signed)

## 2024-07-04 NOTE — Evaluation (Signed)
 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     Patient is a [facility] patient, follows closely with his PCP with frequent blood transfusion.  Apparently had a blood transfusion on 06/19/2024 with 1 units of PRBC.  Not much is available in electronic records, his hemoglobin seems to be running around 7.        He has not reported of any chest pain, shortness of breath, syncope, melena, hematochezia, hematuria.   Clinical Impression  Patient agreeable to and tolerated PT evaluation well. On this date, patient is at/near his baseline, modified independent with all bed mobility, functional tranfers (sit to stand, and toilet transfer), and ambulation. He's mostly limited due to fatigue/ feeling drowsy. Patient does not present with urgent need for skilled physical therapy at this time. Patient discharged to care of nursing for ambulation daily as tolerated for length of stay.       If plan is discharge home, recommend the following: Assist for transportation;Help with stairs or ramp for entrance;A little help with walking and/or transfers   Can travel by private vehicle        Equipment Recommendations None recommended by PT  Recommendations for Other Services       Functional Status Assessment Patient has had a recent decline in their functional status and demonstrates the ability to make significant improvements in function in a reasonable and predictable amount of time.     Precautions / Restrictions Precautions Precautions: None Restrictions Weight Bearing Restrictions Per Provider Order: No      Mobility  Bed  Mobility Overal bed mobility: Needs Assistance Bed Mobility: Supine to Sit, Sit to Supine     Supine to sit: Modified independent (Device/Increase time) Sit to supine: Modified independent (Device/Increase time)   General bed mobility comments: HOB flat, inc time needed due to fatigue    Transfers Overall transfer level: Needs assistance Equipment used: None Transfers: Sit to/from Stand, Bed to chair/wheelchair/BSC Sit to Stand: Modified independent (Device/Increase time)   Step pivot transfers: Modified independent (Device/Increase time)       General transfer comment: Mod Independent due to inc time needed. Bed>toilet transfer, no AD, use of grab bars when standing    Ambulation/Gait Ambulation/Gait assistance: Modified independent (Device/Increase time) Gait Distance (Feet): 75 Feet Assistive device: None Gait Pattern/deviations: Trunk flexed, Decreased step length - right, Decreased step length - left, Decreased stride length, Shuffle Gait velocity: Dec     General Gait Details: No overt LOB or unsteadiness to note. Pt limited due to fatigue level.  Stairs    Wheelchair Mobility     Tilt Bed    Modified Rankin (Stroke Patients Only)       Balance Overall balance assessment: Needs assistance Sitting-balance support: Feet supported, No upper extremity supported Sitting balance-Leahy Scale: Good Sitting balance - Comments: Seated EOB   Standing balance support: During functional activity, No upper extremity supported Standing balance-Leahy Scale: Good Standing balance comment: Fair/good. Not formally assessed but no overt LOB or unsteadiness of concern         Pertinent Vitals/Pain Pain Assessment Pain Assessment: No/denies pain    Home Living Family/patient expects to be discharged to::  Private residence Living Arrangements: Alone Available Help at Discharge: Family;Available PRN/intermittently Type of Home: House Home Access: Stairs to  enter Entrance Stairs-Rails: None Entrance Stairs-Number of Steps: 2   Home Layout: Two level Home Equipment: Agricultural consultant (2 wheels);Cane - single point Additional Comments: Reports he uses SPC some but not often.    Prior Function Prior Level of Function : Independent/Modified Independent     Mobility Comments: Community ambulator, no AD ADLs Comments: Independent     Extremity/Trunk Assessment   Upper Extremity Assessment Upper Extremity Assessment: Generalized weakness;Overall [facility] for tasks assessed (Pt generally weak. Shoulder ROM ~110 deg but WFL, MMT 4-/5)    Lower Extremity Assessment Lower Extremity Assessment: Generalized weakness;Overall [facility] for tasks assessed (Pt generally weak. ankle DF and hip flexion MMT 4-/5 bilaterally)    Cervical / Trunk Assessment Cervical / Trunk Assessment: Kyphotic  Communication   Communication Communication: No apparent difficulties    Cognition Arousal: Alert Behavior During Therapy: WFL for tasks assessed/performed   PT - Cognitive impairments: No apparent impairments       Following commands: Intact       Cueing Cueing Techniques: Verbal cues     General Comments      Exercises     Assessment/Plan    PT Assessment Patient does not need any further PT services  PT Problem List         PT Treatment Interventions      PT Goals (Current goals can be found in the Care Plan section)  Acute Rehab PT Goals Patient Stated Goal: return home with appropriate support PT Goal Formulation: With patient Time For Goal Achievement: 07/07/24 Potential to Achieve Goals: Good    Frequency       Co-evaluation               AM-PAC PT 6 Clicks Mobility  Outcome Measure Help needed turning from your back to your side while in a flat bed without using bedrails?: None Help needed moving from lying on your back to sitting on the side of a flat bed without using bedrails?: None Help needed moving to and from a bed  to a chair (including a wheelchair)?: None Help needed standing up from a chair using your arms (e.g., wheelchair or bedside chair)?: None Help needed to walk in hospital room?: None Help needed climbing 3-5 steps with a railing? : A Little 6 Click Score: 23    End of Session   Activity Tolerance: Patient tolerated treatment well;Patient limited by fatigue Patient left: in bed;with family/visitor present;with call bell/phone within reach;with bed alarm set   PT Visit Diagnosis: Muscle weakness (generalized) (M62.81)    Time: 9069-9056 PT Time Calculation (min) (ACUTE ONLY): 13 min   Charges:   PT Evaluation $PT Eval Low Complexity: 1 Low PT Treatments $Therapeutic Activity: 8-22 mins PT General Charges $$ ACUTE PT VISIT: 1 Visit       11:32 AM, 07/04/24 Rosaria Settler, PT, DPT [place] with [facility]

## 2024-07-04 NOTE — Progress Notes (Signed)
 OT Cancellation Note  Patient Details Name: Ricardo [name] MRN: [id number] DOB: December 18, 1951   Cancelled Treatment:    Reason Eval/Treat Not Completed: OT screened, no needs identified, will sign off. Pt was evaluated by physical therapy and reportedly did well and does not appear to be in need of OT services at this time. Pt will be removed from the OT list. Thank you.   Delisia Mcquiston OT, MOT   Jayson Person 07/04/2024, 12:19 PM

## 2024-07-05 DIAGNOSIS — D649 Anemia, unspecified: Secondary | ICD-10-CM | POA: Diagnosis not present

## 2024-07-05 LAB — CBC
HCT: 25.8 % — ABNORMAL LOW (ref 39.0–52.0)
Hemoglobin: 7.7 g/dL — ABNORMAL LOW (ref 13.0–17.0)
MCH: 29.7 pg (ref 26.0–34.0)
MCHC: 29.8 g/dL — ABNORMAL LOW (ref 30.0–36.0)
MCV: 99.6 fL (ref 80.0–100.0)
Platelets: 276 K/uL (ref 150–400)
RBC: 2.59 MIL/uL — ABNORMAL LOW (ref 4.22–5.81)
RDW: 22.1 % — ABNORMAL HIGH (ref 11.5–15.5)
WBC: 3.3 K/uL — ABNORMAL LOW (ref 4.0–10.5)
nRBC: 3.4 % — ABNORMAL HIGH (ref 0.0–0.2)

## 2024-07-05 LAB — GLUCOSE, CAPILLARY: Glucose-Capillary: 97 mg/dL (ref 70–99)

## 2024-07-05 LAB — PROCALCITONIN: Procalcitonin: 0.97 ng/mL

## 2024-07-05 LAB — SEDIMENTATION RATE: Sed Rate: 31 mm/h — ABNORMAL HIGH (ref 0–20)

## 2024-07-05 LAB — C-REACTIVE PROTEIN: CRP: 1.1 mg/dL — ABNORMAL HIGH (ref <1.0)

## 2024-07-05 NOTE — Plan of Care (Signed)

## 2024-07-05 NOTE — Progress Notes (Signed)
 Mobility Specialist Progress Note:    07/05/24 1001  Mobility  Activity Ambulated with assistance in hallway  Level of Assistance Contact guard assist, steadying assist  Assistive Device None  Distance Ambulated (ft) 70 ft  Range of Motion/Exercises Active;All extremities  Activity Response Tolerated well  Mobility Referral Yes  Mobility visit 1 Mobility  Mobility Specialist Start Time (ACUTE ONLY) 1001  Mobility Specialist Stop Time (ACUTE ONLY) 1020  Mobility Specialist Time Calculation (min) (ACUTE ONLY) 19 min   Pt received in bed, agreeable to mobility. Required CGA to stand and ambulate with no AD. Tolerated well,asx throughout. Returned pt supine, all needs met.  Denny Lave Mobility Specialist Please contact via Special educational needs teacher or  Rehab office at [phone]

## 2024-07-05 NOTE — Plan of Care (Signed)
   Problem: Education: Goal: Knowledge of General Education information will improve Description Including pain rating scale, medication(s)/side effects and non-pharmacologic comfort measures Outcome: Progressing   Problem: Activity: Goal: Risk for activity intolerance will decrease Outcome: Progressing   Problem: Safety: Goal: Ability to remain free from injury will improve Outcome: Progressing

## 2024-07-05 NOTE — Progress Notes (Addendum)
 [facility] PROGRESS NOTE  Ricardo [name] (DOB: 04/23/1951) FMW:[id number] PCP: [facility]  Brief Narrative: Ricardo [name] is a 73 y.o. male with a history of myelofibrosis with transfusion-dependent anemia, GERD, depression, and gout who presented to the ED on 07/03/2024 at the advice of his PCP due to anemia, hgb 6.3g/dl as an outpatient. Last transfusion was 1u RBCs on 7/3. In the ED, anemia was confirmed, hgb 5.9g/dl and 2u RBCs were ordered. Vital signs initially stable, though he developed a significant fever to 102.25F shortly after admission for which work up was done including blood cultures, UA and urine culture, sputum culture. UA revealed bacteriuria and pyuria, and patient has some suprapubic tenderness. WBC has gone 4 > 3.4k and fevers continue. Ceftriaxone  is started and the patient will be monitored for development of sepsis.   Subjective: 102.2F overnight, fever down with tylenol . Pt felt a bit weak and hot, but no significant distress. Denies any new urinary issues, breathing problems or wounds. No neck pain or stiffness or headache. Eating ok, but doesn't like the food.  I told him I would call his daughter Glenys later today as she is not at the bedside.   Objective: BP 124/60   Pulse 77   Temp 99 F (37.2 C)   Resp 20   Ht 6' 1 (1.854 m)   Wt 62.7 kg   SpO2 99%   BMI 18.24 kg/m   Gen: Tall, thin male in no distress Pulm: Clear, nonlabored  CV: RRR, no MRG or pitting edema GI: Soft, NT, ND, +BS Neuro: Alert and oriented. No new focal deficits. Ext: Warm, no deformities. Skin: No infected wounds on visualized skin   Assessment & Plan: Fever, concern for UTI: Note covid PCR negative, CXR clear, no cutaneous or other sources of infection noted at this time. Fever preceded transfusions and is ongoing long after transfusions completed.  - Continue CTX for signs of UTI (suprapubic tenderness, bacteriuria, pyuria) and ongoing fevers with high risk of rapid  deterioration due to myelofibrosis/leukopenia, also left shift noted on smear.  - Continue monitoring blood cultures, NGTD. - Taking adequate po, normotensive, lactic acid normal.   - Recurrent febrile but hemodynamically stable. Needs at least 24 hours fever free prior to discharge. Continue IV abx.  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  - s/p 2u RBCs 7/17, hgb up as anticipated 5.9 >> 7.7. No bleeding. Pt desires limitation of phlebotomy, will get lab holiday 7/20. - No evidence of active bleeding, iron panel shows low TIBC.  - Continue follow up with Dr[facility] Haroldine at the [facility], on luspatercept  q3 weeks.  - Bilirubin elevated with otherwise normal LFTs, smear shows many abnormalities but no schistocytes.   Stage IIIa CKD: Stable, recheck if still here 7/21.  - Avoid nephrotoxins  Depression: Quiescent.  - Continue SSRI, remeron   GERD:  - Continue PPI  Gout:  - Continue allopurinol .   [name], MD [facility] [email or web address] 07/05/2024, 8:47 AM

## 2024-07-06 DIAGNOSIS — D649 Anemia, unspecified: Secondary | ICD-10-CM | POA: Diagnosis not present

## 2024-07-06 LAB — GLUCOSE, CAPILLARY: Glucose-Capillary: 124 mg/dL — ABNORMAL HIGH (ref 70–99)

## 2024-07-06 NOTE — Progress Notes (Signed)
 [facility] PROGRESS NOTE  Ricardo [name] (DOB: [birth year]) FMW:[id number] P[facility]: [facility]  Brief Narrative: Ricardo [name] is a 73 y.o. male with a history of myelofibrosis with transfusion-dependent anemia, GERD, depression, and gout who presented to the ED on 07/03/2024 at the advice of his PCP due to anemia, hgb 6.3g/dl as an outpatient. Last transfusion was 1u RBCs on 7/3. In the ED, anemia was confirmed, hgb 5.9g/dl and 2u RBCs were ordered. Vital signs initially stable, though he developed a significant fever to 102.61F shortly after admission for which work up was done including blood cultures, UA and urine culture, sputum culture. UA revealed bacteriuria and pyuria, and patient has some suprapubic tenderness. WBC has gone 4 > 3.4k and fevers continue. Ceftriaxone  is started and the patient will be monitored for development of sepsis.   Subjective: Felt hot all night, trouble sleeping, no other new symptoms. Walked 70 ft yesterday in hall with CGA, no AD.   Objective: BP 135/75 (BP Location: Right Arm)   Pulse 78   Temp 99.2 F (37.3 C) (Oral)   Resp 16   Ht 6' 1 (1.854 m)   Wt 60.5 kg   SpO2 100%   BMI 17.60 kg/m   Gen: No distress, resting Pulm: Clear, nonlabored  CV: RRR, no MRG or edema GI: Soft, NT, ND, +BS  Neuro: Alert and oriented. No new focal deficits. Ext: Warm, no deformities. Skin: No new rashes, lesions or ulcers on visualized skin   Assessment & Plan: Fever, concern for UTI: Note covid PCR negative, CXR clear, no cutaneous or other sources of infection noted at this time. Fever preceded transfusions and is ongoing long after transfusions completed. ESR 31, CRP 1.1, PCT 0.97. WBC hovering in 3's. Normotensive, lactic acid normal.   - Continue CTX for signs of UTI (suprapubic tenderness, bacteriuria, pyuria) and ongoing fevers with high risk of rapid deterioration due to myelofibrosis/leukopenia, also left shift noted on smear.  - Blood cultures  remain NGTD. - Fevers continue, albeit with decline in severity. Last febrile 5am, will continue monitoring for 24 hours fever-free prior to discharge.   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  - s/p 2u RBCs 7/17, hgb up as anticipated 5.9 >> 7.7. No bleeding. Pt desired limitation on phlebotomy, so will be rechecked 7/21.  - No evidence of active bleeding, iron panel shows low TIBC.  - Continue follow up with Dr. Haroldine at the [facility], on luspatercept  q3 weeks.  - Bilirubin elevated with otherwise normal LFTs, smear shows many abnormalities but no schistocytes.   Stage IIIa CKD: Stable, recheck if still here 7/21.  - Avoid nephrotoxins  Depression: Quiescent.  - Continue SSRI, remeron   GERD:  - Continue PPI  Gout:  - Continue allopurinol .   [name], MD [facility] [email or web address] 07/06/2024, 8:41 AM

## 2024-07-06 NOTE — Progress Notes (Signed)
 Patient has done well so far tonight. No fevers noted upon check, however this writer did medicate the patient with PRN Tylenol  and Oxycodone  for pain which may have aided in keeping his temperature within normal limits. Pts skin is warm to the touch but not hot to the touch as noted previously, especially when he would spike a fever. Pt c/o generalized pain from what pt states is sore from laying in bed too much. PRN analgesics effective. Trazadone given for sleep per the pts request and this also has been effective as the patient has been noted resting well each time this writer has rounded. ABT for UTI continues and urine continues to appear amber in color, hazy in appearance with a strong odor. Voids in urinal. Poor food intake but has had decent po fluid intake tonight. PO fluids encouraged. Bed alarm on and active. Daughter at bedside.

## 2024-07-06 NOTE — Plan of Care (Signed)

## 2024-07-07 DIAGNOSIS — D649 Anemia, unspecified: Secondary | ICD-10-CM

## 2024-07-07 LAB — RESPIRATORY PANEL BY PCR

## 2024-07-07 LAB — RESP PANEL BY RT-PCR (RSV, FLU A&B, COVID)  RVPGX2
Influenza A by PCR: NEGATIVE
Influenza B by PCR: NEGATIVE
Resp Syncytial Virus by PCR: NEGATIVE
SARS Coronavirus 2 by RT PCR: NEGATIVE

## 2024-07-07 LAB — CBC
HCT: 25.4 % — ABNORMAL LOW (ref 39.0–52.0)
Hemoglobin: 7.6 g/dL — ABNORMAL LOW (ref 13.0–17.0)
MCH: 29.7 pg (ref 26.0–34.0)
MCHC: 29.9 g/dL — ABNORMAL LOW (ref 30.0–36.0)
MCV: 99.2 fL (ref 80.0–100.0)
Platelets: 215 K/uL (ref 150–400)
RBC: 2.56 MIL/uL — ABNORMAL LOW (ref 4.22–5.81)
RDW: 20.5 % — ABNORMAL HIGH (ref 11.5–15.5)
WBC: 2.4 K/uL — ABNORMAL LOW (ref 4.0–10.5)
nRBC: 2.1 % — ABNORMAL HIGH (ref 0.0–0.2)

## 2024-07-07 LAB — CBC WITH DIFFERENTIAL/PLATELET
Abs Immature Granulocytes: 0 K/uL (ref 0.00–0.07)
Band Neutrophils: 7 %
Basophils Absolute: 0 K/uL (ref 0.0–0.1)
Basophils Relative: 0 %
Eosinophils Absolute: 0 K/uL (ref 0.0–0.5)
Eosinophils Relative: 1 %
HCT: 27.5 % — ABNORMAL LOW (ref 39.0–52.0)
Hemoglobin: 8.2 g/dL — ABNORMAL LOW (ref 13.0–17.0)
Lymphocytes Relative: 32 %
Lymphs Abs: 0.8 K/uL (ref 0.7–4.0)
MCH: 29.8 pg (ref 26.0–34.0)
MCHC: 29.8 g/dL — ABNORMAL LOW (ref 30.0–36.0)
MCV: 100 fL (ref 80.0–100.0)
Monocytes Absolute: 0.2 K/uL (ref 0.1–1.0)
Monocytes Relative: 7 %
Neutro Abs: 1.6 K/uL — ABNORMAL LOW (ref 1.7–7.7)
Neutrophils Relative %: 53 %
Platelets: 200 K/uL (ref 150–400)
RBC: 2.75 MIL/uL — ABNORMAL LOW (ref 4.22–5.81)
RDW: 20.5 % — ABNORMAL HIGH (ref 11.5–15.5)
WBC: 2.6 K/uL — ABNORMAL LOW (ref 4.0–10.5)
nRBC: 0.8 % — ABNORMAL HIGH (ref 0.0–0.2)

## 2024-07-07 LAB — COMPREHENSIVE METABOLIC PANEL WITH GFR
ALT: 21 U/L (ref 0–44)
AST: 41 U/L (ref 15–41)
Albumin: 3.2 g/dL — ABNORMAL LOW (ref 3.5–5.0)
Alkaline Phosphatase: 89 U/L (ref 38–126)
Anion gap: 12 (ref 5–15)
BUN: 15 mg/dL (ref 8–23)
CO2: 20 mmol/L — ABNORMAL LOW (ref 22–32)
Calcium: 8.3 mg/dL — ABNORMAL LOW (ref 8.9–10.3)
Chloride: 101 mmol/L (ref 98–111)
Creatinine, Ser: 1.2 mg/dL (ref 0.61–1.24)
GFR, Estimated: >60 mL/min (ref >60)
Glucose, Bld: 103 mg/dL — ABNORMAL HIGH (ref 70–99)
Potassium: 3.7 mmol/L (ref 3.5–5.1)
Sodium: 133 mmol/L — ABNORMAL LOW (ref 135–145)
Total Bilirubin: 1.4 mg/dL — ABNORMAL HIGH (ref 0.0–1.2)
Total Protein: 6.1 g/dL — ABNORMAL LOW (ref 6.5–8.1)

## 2024-07-07 LAB — PROCALCITONIN: Procalcitonin: 0.74 ng/mL

## 2024-07-07 LAB — MRSA NEXT GEN BY PCR, NASAL: MRSA by PCR Next Gen: NOT DETECTED

## 2024-07-07 LAB — GLUCOSE, CAPILLARY: Glucose-Capillary: 105 mg/dL — ABNORMAL HIGH (ref 70–99)

## 2024-07-07 NOTE — Progress Notes (Signed)
 [facility] PROGRESS NOTE  ZAKHI DUPRE (DOB: 1951-07-01) FMW:[id number] P[facility]: [facility]  Brief Narrative: Ricardo [name] is a 73 y.o. male with a history of myelofibrosis with transfusion-dependent anemia, GERD, depression, and gout who presented to the ED on 07/03/2024 at the advice of his PCP due to anemia, hgb 6.3g/dl as an outpatient. Last transfusion was 1u RBCs on 7/3. In the ED, anemia was confirmed, hgb 5.9g/dl and 2u RBCs were ordered. Vital signs initially stable, though he developed a significant fever to 102.37F shortly after admission for which work up was done including blood cultures, UA and urine culture, sputum culture. UA revealed bacteriuria and pyuria, and patient has some suprapubic tenderness. WBC has gone 4 > 3.4k and fevers continue. Ceftriaxone  is started and the patient will be monitored for development of sepsis.   Subjective: No real change, he is still eating fine, feels warm at times but not ill. Denies specifically neck pain or stiffness, HA, chest pain, dyspnea, cough, abdominal pain, N/V/D, wounds. No family at bedside, called and spoke with his only daughter who is an [employer] fund[employer].   Objective: BP 127/74 (BP Location: Right Arm)   Pulse 81   Temp 99.4 F (37.4 C) (Oral)   Resp 16   Ht 6' 1 (1.854 m)   Wt 59.7 kg   SpO2 98%   BMI 17.36 kg/m   Gen: Thin male in no distress Pulm: Clear, nonlabored  CV: RRR, no MRG or edema GI: Soft, NT, ND, +BS  Neuro: Alert and oriented. No new focal deficits. Ext: Warm, no deformities. Skin: No rashes, lesions or ulcers on visualized skin   Assessment & Plan: Fever, concern for UTI: Note covid PCR negative, CXR clear, no cutaneous or other sources of infection noted at this time. Fever preceded transfusions and is ongoing long after transfusions completed. ESR 31, CRP 1.1, PCT 0.97. WBC hovering in 3's. Normotensive, lactic acid normal.   - Continue CTX for signs of UTI (suprapubic tenderness, bacteriuria, pyuria)  and ongoing fevers with high risk of rapid deterioration due to myelofibrosis/leukopenia, also left shift noted on smear. No urine culture sent unfortunately. Has received 3 days abx with some overall improvement in severity of fevers but persistently febrile. - Will check viral panels, now that more leukopenic, concerned for neutropenic fever. Will consult heme/onc, Dr. Davonna whose assistance is appreciated. Add diff, consider expanding infectious work up.   - Blood cultures remain NGTD.   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 - No evidence of active bleeding, iron panel shows low TIBC.  - Continue follow up with Dr. Haroldine at the [facility], on luspatercept  q3 weeks.  - Bilirubin elevated with otherwise normal LFTs, smear shows many abnormalities but no schistocytes.   Stage IIIa CKD: Stable, recheck if still here 7/21.  - Avoid nephrotoxins  Depression: Quiescent.  - Continue SSRI, remeron   GERD:  - Continue PPI  Gout:  - Continue allopurinol .   [name], MD [facility] [email or web address] 07/07/2024, 2:14 PM

## 2024-07-07 NOTE — Plan of Care (Signed)

## 2024-07-07 NOTE — Progress Notes (Addendum)
 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  He had a hemoglobin of 5.9 in the ED and received 2 units of packed red blood cells. Patient developed fever with UTI symptoms while in the ER and was given ceftriaxone . Hematology consulted for unresolving fever in the setting of myelofibrosis.  Today, patient has no complaints.  Stated that he has been doing very well.  He denies fevers at home, night sweats, weight loss, loss of appetite, fatigue.  He stated that he was diagnosed with myelofibrosis a few years ago and was on Jakafi for a while.  He was started on luspatercept  October 2024 and has only required blood transfusion at the beginning of this month and this is the second episode requiring blood transfusion. He denies abdominal pain, cough, chest pain, shortness of breath, burning urination.  Overall, he states that he is feeling well and has no other complaints.    PAST MEDICAL HISTORY:   Past Medical History:  Diagnosis Date   Alcohol abuse    Anemia    Arthritis    Carpal tunnel syndrome    Gout    History of radiation therapy 04/10/2017-06/04/2017   Site/dose:   The prostatic fossa was treated to 68.4 Gy in 38 fractions of 1.8 Gy   Myelofibrosis (HCC)    Prostate cancer (HCC)     ALLERGIES: No Known Allergies    MEDICATIONS: I have reviewed the patient's current medications.     PAST SURGICAL HISTORY Past Surgical History:  Procedure Laterality Date   COLONOSCOPY WITH PROPOFOL  N/A 09/10/2023   Procedure: COLONOSCOPY WITH PROPOFOL ;  Surgeon: Cindie Carlin POUR,  DO;  [facility]: [facility];  Service: Endoscopy;  Laterality: N/A;  11:30 am, asa 3   CYSTECTOMY     left bicept   POLYPECTOMY  09/10/2023   Procedure: POLYPECTOMY;  Surgeon: Cindie Carlin POUR, DO;  Location: [facility];  Service: Endoscopy;;   PROSTATECTOMY  2014   TENDON REPAIR     right thumb    FAMILY HISTORY: Family History  Problem Relation Age of Onset   Heart disease Mother    Hypertension Sister    Cancer Maternal Aunt        breast   Cancer Maternal Uncle        colon   Cancer Maternal Uncle        lung    SOCIAL HISTORY:  reports that he has never smoked. He has never been exposed to tobacco smoke. His smokeless tobacco use includes snuff. He reports that he does not currently use alcohol. He reports that he does not use drugs.  PERFORMANCE STATUS: The patient's performance status is 2 - Symptomatic, <50% confined to bed  PHYSICAL EXAM: Most Recent Vital Signs: Blood pressure 127/74, pulse 81, temperature 99.4 F (37.4 C), temperature source Oral, resp. rate 16, height 6' 1 (1.854 m), weight 131 lb 9.8 oz (59.7 kg), SpO2 98%.  GENERAL:alert, no distress and comfortable, frail male SKIN: skin color, texture, turgor are normal, no rashes or significant lesions LYMPH:  no palpable lymphadenopathy in  the cervical, axillary or inguinal LUNGS: clear to auscultation and percussion with normal breathing effort HEART: regular rate & rhythm and no murmurs and no lower extremity edema ABDOMEN:abdomen soft, non-tender and normal bowel sounds Musculoskeletal:no cyanosis of digits and no clubbing  PSYCH: alert & oriented x 3 with fluent speech NEURO: no focal motor/sensory deficits   LABORATORY DATA:  Results for orders placed or performed during the hospital encounter of 07/03/24 (from the past 48 hours)  Glucose, capillary     Status: Abnormal   Collection Time: 07/06/24  7:22 AM  Result Value Ref Range   Glucose-Capillary 124 (H) 70 - 99 mg/dL    Comment:  Glucose reference range applies only to samples taken after fasting for at least 8 hours.  CBC     Status: Abnormal   Collection Time: 07/07/24  4:06 AM  Result Value Ref Range   WBC 2.4 (L) 4.0 - 10.5 K/uL   RBC 2.56 (L) 4.22 - 5.81 MIL/uL   Hemoglobin 7.6 (L) 13.0 - 17.0 g/dL   HCT 74.5 (L) 60.9 - 47.9 %   MCV 99.2 80.0 - 100.0 fL   MCH 29.7 26.0 - 34.0 pg   MCHC 29.9 (L) 30.0 - 36.0 g/dL   RDW 79.4 (H) 88.4 - 84.4 %   Platelets 215 150 - 400 K/uL   nRBC 2.1 (H) 0.0 - 0.2 %    Comment: Performed at [facility], [address], [place], [place] [address]  Comprehensive metabolic panel with GFR     Status: Abnormal   Collection Time: 07/07/24  4:06 AM  Result Value Ref Range   Sodium 133 (L) 135 - 145 mmol/L   Potassium 3.7 3.5 - 5.1 mmol/L   Chloride 101 98 - 111 mmol/L   CO2 20 (L) 22 - 32 mmol/L   Glucose, Bld 103 (H) 70 - 99 mg/dL    Comment: Glucose reference range applies only to samples taken after fasting for at least 8 hours.   BUN 15 8 - 23 mg/dL   Creatinine, Ser 8.79 0.61 - 1.24 mg/dL   Calcium  8.3 (L) 8.9 - 10.3 mg/dL   Total Protein 6.1 (L) 6.5 - 8.1 g/dL   Albumin 3.2 (L) 3.5 - 5.0 g/dL   AST 41 15 - 41 U/L   ALT 21 0 - 44 U/L   Alkaline Phosphatase 89 38 - 126 U/L   Total Bilirubin 1.4 (H) 0.0 - 1.2 mg/dL   GFR, Estimated >39 >39 mL/min    Comment: (NOTE) Calculated using the CKD-EPI Creatinine Equation (2021)    Anion gap 12 5 - 15    Comment: Performed at [facility], [address] [place], [place] [address]  Procalcitonin     Status: None   Collection Time: 07/07/24  4:06 AM  Result Value Ref Range   Procalcitonin 0.74 ng/mL    Comment:        Interpretation: PCT > 0.5 ng/mL and <= 2 ng/mL: Systemic infection (sepsis) is possible, but other conditions are known to elevate PCT as well. (NOTE)       Sepsis PCT Algorithm           Lower Respiratory Tract                                      Infection PCT Algorithm    ----------------------------      ----------------------------  PCT < 0.25 ng/mL                PCT < 0.10 ng/mL          Strongly encourage             Strongly discourage   discontinuation of antibiotics    initiation of antibiotics    ----------------------------     -----------------------------       PCT 0.25 - 0.50 ng/mL            PCT 0.10 - 0.25 ng/mL               OR       >80% decrease in PCT            Discourage initiation of                                            antibiotics      Encourage discontinuation           of antibiotics    ----------------------------     -----------------------------         PCT >= 0.50 ng/mL              PCT 0.26 - 0.50 ng/mL                AND       <80% decrease in PCT             Encourage initiation of                                             antibiotics       Encourage continuation           of antibiotics    ----------------------------     -----------------------------        PCT >= 0.50 ng/mL                  PCT > 0.50 ng/mL               AND         increase in PCT                  Strongly encourage                                      initiation of antibiotics    Strongly encourage escalation           of antibiotics                                     -----------------------------                                           PCT <= 0.25 ng/mL  OR                                        > 80% decrease in PCT                                      Discontinue / Do not initiate                                             antibiotics  Performed at [facility], [address], [place], [place] [address]   Glucose, capillary     Status: Abnormal   Collection Time: 07/07/24  7:23 AM  Result Value Ref Range   Glucose-Capillary 105 (H) 70 - 99 mg/dL    Comment: Glucose reference range applies only to samples taken after fasting for at least 8 hours.  Resp panel by RT-PCR (RSV, Flu A&B, Covid) Anterior Nasal  Swab     Status: None   Collection Time: 07/07/24  9:49 AM   Specimen: Anterior Nasal Swab  Result Value Ref Range   SARS Coronavirus 2 by RT PCR NEGATIVE NEGATIVE    Comment: (NOTE) SARS-CoV-2 target nucleic acids are NOT DETECTED.  The SARS-CoV-2 RNA is generally detectable in upper respiratory specimens during the acute phase of infection. The lowest concentration of SARS-CoV-2 viral copies this assay can detect is 138 copies/mL. A negative result does not preclude SARS-Cov-2 infection and should not be used as the sole basis for treatment or other patient management decisions. A negative result may occur with  improper specimen collection/handling, submission of specimen other than nasopharyngeal swab, presence of viral mutation(s) within the areas targeted by this assay, and inadequate number of viral copies(<138 copies/mL). A negative result must be combined with clinical observations, patient history, and epidemiological information. The expected result is Negative.  Fact Sheet for Patients:  BloggerCourse.com  Fact Sheet for Healthcare Providers:  SeriousBroker.it  This test is no t yet approved or cleared by the [place]  FDA and  has been authorized for detection and/or diagnosis of SARS-CoV-2 by FDA under an Emergency Use Authorization (EUA). This EUA will remain  in effect (meaning this test can be used) for the duration of the COVID-19 declaration under Section 564(b)(1) of the Act, 21 U.S.C.section 360bbb-3(b)(1), unless the authorization is terminated  or revoked sooner.       Influenza A by PCR NEGATIVE NEGATIVE   Influenza B by PCR NEGATIVE NEGATIVE    Comment: (NOTE) The Xpert Xpress SARS-CoV-2/FLU/RSV plus assay is intended as an aid in the diagnosis of influenza from Nasopharyngeal swab specimens and should not be used as a sole basis for treatment. Nasal washings and aspirates are unacceptable for  Xpert Xpress SARS-CoV-2/FLU/RSV testing.  Fact Sheet for Patients: BloggerCourse.com  Fact Sheet for Healthcare Providers: SeriousBroker.it  This test is not yet approved or cleared by the [place]  FDA and has been authorized for detection and/or diagnosis of SARS-CoV-2 by FDA under an Emergency Use Authorization (EUA). This EUA will remain in effect (meaning this test can be used) for the duration of the COVID-19 declaration under Section 564(b)(1) of the Act, 21 U.S.C. section 360bbb-3(b)(1), unless the authorization is terminated or revoked.  Resp Syncytial Virus by PCR NEGATIVE NEGATIVE    Comment: (NOTE) Fact Sheet for Patients: BloggerCourse.com  Fact Sheet for Healthcare Providers: SeriousBroker.it  This test is not yet approved or cleared by the [place]  FDA and has been authorized for detection and/or diagnosis of SARS-CoV-2 by FDA under an Emergency Use Authorization (EUA). This EUA will remain in effect (meaning this test can be used) for the duration of the COVID-19 declaration under Section 564(b)(1) of the Act, 21 U.S.C. section 360bbb-3(b)(1), unless the authorization is terminated or revoked.  Performed at [facility], [address] [place], [place] [address]   CBC with Differential/Platelet     Status: Abnormal   Collection Time: 07/07/24  1:35 PM  Result Value Ref Range   WBC 2.6 (L) 4.0 - 10.5 K/uL   RBC 2.75 (L) 4.22 - 5.81 MIL/uL   Hemoglobin 8.2 (L) 13.0 - 17.0 g/dL   HCT 72.4 (L) 60.9 - 47.9 %   MCV 100.0 80.0 - 100.0 fL   MCH 29.8 26.0 - 34.0 pg   MCHC 29.8 (L) 30.0 - 36.0 g/dL   RDW 79.4 (H) 88.4 - 84.4 %   Platelets 200 150 - 400 K/uL   nRBC 0.8 (H) 0.0 - 0.2 %   Neutrophils Relative % 53 %   Neutro Abs 1.6 (L) 1.7 - 7.7 K/uL   Band Neutrophils 7 %   Lymphocytes Relative 32 %   Lymphs Abs 0.8 0.7 - 4.0 K/uL   Monocytes  Relative 7 %   Monocytes Absolute 0.2 0.1 - 1.0 K/uL   Eosinophils Relative 1 %   Eosinophils Absolute 0.0 0.0 - 0.5 K/uL   Basophils Relative 0 %   Basophils Absolute 0.0 0.0 - 0.1 K/uL   Smear Review MORPHOLOGY UNREMARKABLE    Abs Immature Granulocytes 0.00 0.00 - 0.07 K/uL   Reactive, Benign Lymphocytes PRESENT    Polychromasia PRESENT     Comment: Performed at [facility], [address] [place], [place] [address]      RADIOGRAPHY: DG Chest Portable 1 View CLINICAL DATA:  fever  EXAM: PORTABLE CHEST - 1 VIEW  COMPARISON:  None available.  FINDINGS: No focal airspace consolidation, pleural effusion, or pneumothorax. No cardiomegaly. No acute fracture or destructive lesion.  IMPRESSION: No acute cardiopulmonary abnormality.  Electronically Signed   By: Rogelia Myers M.D.   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  PLAN:   1.  Myelofibrosis: Patient was diagnosed with myelofibrosis a few years ago and was on Jakafi before transitioning to luspatercept  in October 2024.  Patient follows with oncology at [facility]. No records available at this time.  Patient has been recently transfusion dependent but there are no B symptoms.  -It is difficult to assess the status of the disease but considering new transfusion dependence and fevers, there is a chance of progression at this time. - Recommended patient to follow-up with his oncologist 1 week after discharge for labs and further discussion of treatment management.  2.  Fever: Infectious workup so far has been negative except for UA that was positive.  Completed course of ceftriaxone .  Fever curve trending down.  Differential includes progression of myelofibrosis or treatment refractoriness.  - I would expect the fevers to trend down - If patient has recurrent fevers without any resolution,  would consider doing an extended infectious workup including a CT scan  of abdomen and pelvis. - Would recommend keeping an eye for neutropenic fever and obtaining a CBC with differential for further guidance.  3.  Anemia: Likely secondary to disease progression or side effect of medication.  - Transfuse for hemoglobin less than 7 - Continue to trend CBC with differential daily  Thank you for involving us  in this patient care. All questions were answered.  Please reach out with any questions or concerns.  60 minutes of total time was spent for this patient encounter, including preparation, face-to-face counseling with the patient and coordination of care, physical exam, and documentation of the encounter.  Mickiel Dry, MD Hematology/Oncology [facility] at [facility]

## 2024-07-08 ENCOUNTER — Inpatient Hospital Stay (HOSPITAL_COMMUNITY)

## 2024-07-08 DIAGNOSIS — D649 Anemia, unspecified: Secondary | ICD-10-CM | POA: Diagnosis not present

## 2024-07-08 LAB — CBC WITH DIFFERENTIAL/PLATELET
Abs Immature Granulocytes: 0.1 K/uL — ABNORMAL HIGH (ref 0.00–0.07)
Band Neutrophils: 3 %
Basophils Absolute: 0.1 K/uL (ref 0.0–0.1)
Basophils Relative: 3 %
Eosinophils Absolute: 0 K/uL (ref 0.0–0.5)
Eosinophils Relative: 2 %
HCT: 23.1 % — ABNORMAL LOW (ref 39.0–52.0)
Hemoglobin: 7 g/dL — ABNORMAL LOW (ref 13.0–17.0)
Lymphocytes Relative: 34 %
Lymphs Abs: 0.6 K/uL — ABNORMAL LOW (ref 0.7–4.0)
MCH: 29.7 pg (ref 26.0–34.0)
MCHC: 30.3 g/dL (ref 30.0–36.0)
MCV: 97.9 fL (ref 80.0–100.0)
Metamyelocytes Relative: 3 %
Monocytes Absolute: 0 K/uL — ABNORMAL LOW (ref 0.1–1.0)
Monocytes Relative: 2 %
Neutro Abs: 1.1 K/uL — ABNORMAL LOW (ref 1.7–7.7)
Neutrophils Relative %: 53 %
Platelets: 232 K/uL (ref 150–400)
RBC: 2.36 MIL/uL — ABNORMAL LOW (ref 4.22–5.81)
RDW: 19.7 % — ABNORMAL HIGH (ref 11.5–15.5)
Smear Review: NORMAL
WBC: 1.9 K/uL — ABNORMAL LOW (ref 4.0–10.5)
nRBC: 1 /100{WBCs} — ABNORMAL HIGH
nRBC: 1.6 % — ABNORMAL HIGH (ref 0.0–0.2)

## 2024-07-08 LAB — BASIC METABOLIC PANEL WITH GFR
Anion gap: 8 (ref 5–15)
BUN: 17 mg/dL (ref 8–23)
CO2: 22 mmol/L (ref 22–32)
Calcium: 8 mg/dL — ABNORMAL LOW (ref 8.9–10.3)
Chloride: 101 mmol/L (ref 98–111)
Creatinine, Ser: 1.16 mg/dL (ref 0.61–1.24)
GFR, Estimated: >60 mL/min (ref >60)
Glucose, Bld: 99 mg/dL (ref 70–99)
Potassium: 4.1 mmol/L (ref 3.5–5.1)
Sodium: 131 mmol/L — ABNORMAL LOW (ref 135–145)

## 2024-07-08 LAB — CULTURE, BLOOD (ROUTINE X 2)
Culture: NO GROWTH
Culture: NO GROWTH
Special Requests: ADEQUATE

## 2024-07-08 LAB — PREPARE RBC (CROSSMATCH)

## 2024-07-08 MED ORDER — SODIUM CHLORIDE 0.9% IV SOLUTION: Freq: Once | INTRAVENOUS | Status: DC

## 2024-07-08 MED ORDER — SODIUM CHLORIDE 0.9 % IV SOLN
2.0000 g | Freq: Two times a day (BID) | INTRAVENOUS | Status: DC
  Administered 2024-07-08 – 2024-07-10 (×5): 2 g via INTRAVENOUS
  Filled 2024-07-08 (×5): qty 12.5

## 2024-07-08 MED ORDER — IOHEXOL 300 MG/ML  SOLN
100.0000 mL | Freq: Once | INTRAMUSCULAR | Status: AC | PRN
  Administered 2024-07-08: 100 mL via INTRAVENOUS

## 2024-07-08 NOTE — Plan of Care (Signed)
   Problem: Nutrition: Goal: Adequate nutrition will be maintained Outcome: Progressing

## 2024-07-08 NOTE — Progress Notes (Signed)
 [facility] PROGRESS NOTE  Ricardo [name] (DOB: 1951/11/30) FMW:[id number] [facility]: [facility]  Brief Narrative: Ricardo [name] is a 73 y.o. male with a history of myelofibrosis with transfusion-dependent anemia, GERD, depression, and gout who presented to the ED on 07/03/2024 at the advice of his PCP due to anemia, hgb 6.3g/dl as an outpatient. Last transfusion was 1u RBCs on 7/3. In the ED, anemia was confirmed, hgb 5.9g/dl and 2u RBCs were ordered. Vital signs initially stable, though he developed a significant fever to 102.68F shortly after admission for which work up was done including blood cultures, UA and urine culture, sputum culture. UA revealed bacteriuria and pyuria, and patient has some suprapubic tenderness. WBC has gone 4 > 3.4k and fevers continued. Ceftriaxone  was started. ANC has declined to 1.1k and fevers continue, so antibiotics broadened, heme/onc consulted, CT chest/abd/pelvis pending. Hgb recurrently down as well so an additional 1u RBCs given 7/22.   Subjective: Daughter at bedside. Pt did report an episode of green diarrhea prior to coming in but has had no ongoing issues. Eating ok. Continues to deny dysuria, urinary changes, abd pain, diarrhea, chest pain, dyspnea, cough, wounds or new neck pain. Has chronic neck stiffness that is unchanged.   Objective: BP 117/63 (BP Location: Right Arm)   Pulse 80   Temp (!) 100.9 F (38.3 C) (Oral)   Resp 16   Ht 6' 1 (1.854 m)   Wt 59.7 kg   SpO2 95%   BMI 17.36 kg/m   Gen: Thin male in no distress Pulm: Clear, nonlabored  CV: RRR, no MRG or edema GI: Soft, NT, ND, +BS  Neuro: Alert and oriented. No new focal deficits. Ext: Warm, no deformities. Decreased muscle bulk.  Skin: Peripheral IVs bilateral UE's appear normal, no wounds or ulcers on visualized skin   Assessment & Plan: Neutropenic fever, concern for UTI: Note covid PCR negative, CXR clear, no cutaneous or other sources of infection noted at this time.  Fever preceded transfusions and is ongoing long after transfusions completed. ESR 31, CRP 1.1, PCT 0.97. WBC was borderline and has declined with ANC of 1.1 on 7/22. Normotensive, lactic acid normal.   - Discussed again with heme/onc, Ricardo [name] whose assistance is appreciated. Will pursue CT investigation today. She will attempt to get in contact with oncology at [facility] as his fevers may be from disease progression.  - Blood cultures from 7/17 remain NGTD. Repeat 7/22.  - Repeat blood cultures with persistent fevers, now neutropenic fever.   - Change CTX to cefepime . MRSA PCR is negative, there is low suspicion for MRSA (catheter-related infection, skin or soft-tissue infection, pneumonia, hemodynamic instability, severe oral or pharyngeal mucositis or history of MRSA infection), so defer vancomycin at this time.   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  - s/p 2u RBCs 7/17, hgb down again, update T&S and give an additional 1u RBCs.  - No evidence of active bleeding, iron panel shows low TIBC.  - Continue follow up with Ricardo [name] at the [facility], on luspatercept  q3 weeks.  - Bilirubin elevated with otherwise normal LFTs, smear shows many abnormalities but no schistocytes.   Stage IIIa CKD: Stable, recheck if still here 7/21.  - Avoid nephrotoxins  Depression: Quiescent.  - Continue SSRI, remeron   GERD:  - Continue PPI  Gout:  - Continue allopurinol .   Underweight.   Ricardo KATHEE Come, MD [facility] [email or web address] 07/08/2024, 7:21 AM

## 2024-07-08 NOTE — Plan of Care (Signed)
   Problem: Education: Goal: Knowledge of General Education information will improve Description: Including pain rating scale, medication(s)/side effects and non-pharmacologic comfort measures Outcome: Progressing   Problem: Clinical Measurements: Goal: Ability to maintain clinical measurements within normal limits will improve Outcome: Progressing Goal: Diagnostic test results will improve Outcome: Progressing

## 2024-07-09 DIAGNOSIS — D649 Anemia, unspecified: Secondary | ICD-10-CM | POA: Diagnosis not present

## 2024-07-09 LAB — CBC WITH DIFFERENTIAL/PLATELET
Abs Immature Granulocytes: 0.09 K/uL — ABNORMAL HIGH (ref 0.00–0.07)
Basophils Absolute: 0 K/uL (ref 0.0–0.1)
Basophils Relative: 1 %
Eosinophils Absolute: 0 K/uL (ref 0.0–0.5)
Eosinophils Relative: 1 %
HCT: 31.2 % — ABNORMAL LOW (ref 39.0–52.0)
Hemoglobin: 9.7 g/dL — ABNORMAL LOW (ref 13.0–17.0)
Immature Granulocytes: 3 %
Lymphocytes Relative: 34 %
Lymphs Abs: 1 K/uL (ref 0.7–4.0)
MCH: 29.8 pg (ref 26.0–34.0)
MCHC: 31.1 g/dL (ref 30.0–36.0)
MCV: 96 fL (ref 80.0–100.0)
Monocytes Absolute: 0.3 K/uL (ref 0.1–1.0)
Monocytes Relative: 11 %
Neutro Abs: 1.4 K/uL — ABNORMAL LOW (ref 1.7–7.7)
Neutrophils Relative %: 50 %
Platelets: 253 K/uL (ref 150–400)
RBC: 3.25 MIL/uL — ABNORMAL LOW (ref 4.22–5.81)
RDW: 19 % — ABNORMAL HIGH (ref 11.5–15.5)
WBC: 2.8 K/uL — ABNORMAL LOW (ref 4.0–10.5)
nRBC: 1.8 % — ABNORMAL HIGH (ref 0.0–0.2)

## 2024-07-09 LAB — TYPE AND SCREEN
ABO/RH(D): O NEG
Antibody Screen: NEGATIVE
Unit division: 0

## 2024-07-09 LAB — COMPREHENSIVE METABOLIC PANEL WITH GFR
ALT: 21 U/L (ref 0–44)
AST: 45 U/L — ABNORMAL HIGH (ref 15–41)
Albumin: 3.5 g/dL (ref 3.5–5.0)
Alkaline Phosphatase: 92 U/L (ref 38–126)
Anion gap: 13 (ref 5–15)
BUN: 18 mg/dL (ref 8–23)
CO2: 22 mmol/L (ref 22–32)
Calcium: 8.4 mg/dL — ABNORMAL LOW (ref 8.9–10.3)
Chloride: 100 mmol/L (ref 98–111)
Creatinine, Ser: 1.11 mg/dL (ref 0.61–1.24)
GFR, Estimated: >60 mL/min (ref >60)
Glucose, Bld: 99 mg/dL (ref 70–99)
Potassium: 4.3 mmol/L (ref 3.5–5.1)
Sodium: 135 mmol/L (ref 135–145)
Total Bilirubin: 1.4 mg/dL — ABNORMAL HIGH (ref 0.0–1.2)
Total Protein: 6.6 g/dL (ref 6.5–8.1)

## 2024-07-09 LAB — BPAM RBC
Blood Product Expiration Date: 202508142359
ISSUE DATE / TIME: 202507221138
Unit Type and Rh: 9500

## 2024-07-09 NOTE — Plan of Care (Signed)

## 2024-07-09 NOTE — Progress Notes (Signed)
 [facility] PROGRESS NOTE  Ricardo [name] (DOB: May 20, 1951) FMW:[id number] [facility]: [facility]  Brief Narrative: Ricardo [name] is a 73 y.o. male with a history of myelofibrosis with transfusion-dependent anemia, GERD, depression, and gout who presented to the ED on 07/03/2024 at the advice of his PCP due to anemia, hgb 6.3g/dl as an outpatient. Last transfusion was 1u RBCs on 7/3. In the ED, anemia was confirmed, hgb 5.9g/dl and 2u RBCs were ordered. Vital signs initially stable, though he developed a significant fever to 102.57F shortly after admission for which work up was done including blood cultures, UA and urine culture, sputum culture. UA revealed bacteriuria and pyuria, and patient has some suprapubic tenderness. WBC has gone 4 > 3.4k and fevers continued. Ceftriaxone  was started. ANC has declined to 1.1k and fevers continue, so antibiotics broadened, heme/onc consulted, CT chest/abd/pelvis pending. Hgb recurrently down as well so an additional 1u RBCs given 7/22.   Subjective: Occasional loose stools at times greenish No fever  Or chills  - No nausea or vomiting - Patient had a fever of 103.0 on 07/08/2024 at 8:05 PM - Consider possible discharge home if afebrile for 24 hours and if cultures remain negative  Objective: BP 115/65 (BP Location: Right Arm)   Pulse 77   Temp 98.8 F (37.1 C)   Resp 17   Ht 6' 1 (1.854 m)   Wt 60.2 kg   SpO2 97%   BMI 17.51 kg/m     Physical Exam  Gen:- Awake Alert, in no acute distress  HEENT:- [place].AT, No sclera icterus Neck-Supple Neck,No JVD,.  Lungs-  CTAB , fair air movement bilaterally  CV- S1, S2 normal, RRR Abd-  +ve B.Sounds, Abd Soft, No tenderness,    Extremity/Skin:- No  edema,   good pedal pulses  Psych-affect is appropriate, oriented x3 Neuro-no new focal deficits, no tremors   Assessment & Plan: Neutropenic fever, concern for UTI: Note covid PCR negative, CXR clear, no cutaneous or other sources of infection noted  at this time. Fever preceded transfusions and is ongoing long after transfusions completed. ESR 31, CRP 1.1, PCT 0.97. WBC was borderline and has declined with ANC of 1.1 on 7/22. Normotensive, lactic acid normal.   - Discussed again with heme/onc, Dr. Davonna whose assistance is appreciated.  SABRA She will attempt to get in contact with oncology at [facility] as his fevers may be from disease progression.  - Blood cultures from 7/17 remain NGTD. Repeat blood cultures from 07/08/2024 NGTD - Repeat blood cultures with persistent fevers, now neutropenic fever.   - Change CTX to cefepime . MRSA PCR is negative, there is low suspicion for MRSA (catheter-related infection, skin or soft-tissue infection, pneumonia, hemodynamic instability, severe oral or pharyngeal mucositis or history of MRSA infection), so defer vancomycin at this time.  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  Hgb 9.7 after receiving total of 3 units of PRBC since 07/03/2024 - No evidence of active bleeding, iron panel shows low TIBC.  - Continue follow up with Dr. Haroldine at the [facility], on luspatercept  q3 weeks.  - Bilirubin elevated with otherwise normal LFTs, smear shows many abnormalities but no schistocytes.   Stage IIIa CKD: Stable, recheck if still here 7/21.  - Avoid nephrotoxins  Depression: Quiescent.  - Continue SSRI, remeron   GERD:  - Continue PPI  Gout:  - Continue allopurinol .   Underweight.   Rendall Carwin, MD [facility] [email or web address] 07/09/2024, 6:29 PM

## 2024-07-10 DIAGNOSIS — D649 Anemia, unspecified: Secondary | ICD-10-CM | POA: Diagnosis not present

## 2024-07-10 MED ORDER — ASPIRIN 81 MG PO TBEC: 81.0000 mg | DELAYED_RELEASE_TABLET | Freq: Every day | ORAL | 12 refills | Status: DC

## 2024-07-10 NOTE — Discharge Summary (Signed)
 Ricardo [name], is a 73 y.o. male  DOB 08/17/1951  MRN [id number].  Admission date:  07/03/2024  Admitting Physician  Rocky JONETTA Grams, RN  Discharge Date:  07/10/2024   Primary MD  [facility] [place]  Recommendations for primary care physician for things to follow:  1)Avoid ibuprofen/Advil/Aleve/Motrin/Goody Powders/Naproxen/BC powders/Meloxicam/Diclofenac/Indomethacin  and other Nonsteroidal anti-inflammatory medications as these will make you more likely to bleed and can cause stomach ulcers, can also cause Kidney problems.   2) please follow-up with oncologist at the [facility] in [place] for repeat CBC and CMP blood test and for possible bone marrow biopsy  Admission Diagnosis  Symptomatic anemia [D64.9]   Discharge Diagnosis  Symptomatic anemia [D64.9]    Principal Problem:   Symptomatic anemia Active Problems:   Gout, unspecified   Myelofibrosis (HCC)   Iron deficiency anemia due to chronic blood loss   CKD (chronic kidney disease) stage 3, GFR 30-59 ml/min (HCC)   Depression   GERD (gastroesophageal reflux disease)      Past Medical History:  Diagnosis Date   Alcohol abuse    Anemia    Arthritis    Carpal tunnel syndrome    Gout    History of radiation therapy 04/10/2017-06/04/2017   Site/dose:   The prostatic fossa was treated to 68.4 Gy in 38 fractions of 1.8 Gy   Myelofibrosis (HCC)    Prostate cancer [facility])     Past Surgical History:  Procedure Laterality Date   COLONOSCOPY WITH PROPOFOL  N/A 09/10/2023   Procedure: COLONOSCOPY WITH PROPOFOL ;  Surgeon: Cindie Carlin POUR, DO;  [facility];  Service: Endoscopy;  Laterality: N/A;  11:30 am, asa 3   CYSTECTOMY     left bicept   POLYPECTOMY  09/10/2023   Procedure: POLYPECTOMY;  Surgeon: Cindie Carlin POUR, DO;  Location: [facility];  Service: Endoscopy;;   PROSTATECTOMY  2014   TENDON REPAIR     right thumb       HPI   from the history and physical done on the day of admission:   Ricardo [name] is a 73 year old male with extensive history of myelofibrosis, with chronic anemia due to myelofibrosis and iron deficiency, GERD, depression, gout...Ricardo [name] Presented [facility] ED [facility] for recent lab workup noting hemoglobin of 6.3.   [facility], follows closely with his PCP with frequent blood transfusion.  Apparently had a blood transfusion on 06/19/2024 with 1 units of PRBC.  Not much is available in electronic records, his hemoglobin seems to be running around 7.     He has not reported of any chest pain, shortness of breath, syncope, melena, hematochezia, hematuria.   ED evaluation: Blood pressure 115/61, pulse 69, temperature 99 F (37.2 C), temperature source Oral, resp. rate 18, height 6' 1 (1.854 m), weight 69.5 kg, SpO2 98%.   CBC: Hemoglobin 5.9 (on 06/19/2024 hemoglobin was 6.8) hematocrit 20.3, MCV 105.2, RDW 23.1, BMP sodium 133, glucose 105, creatinine 1.34, GFR 57   Per EDP: Initiated on  2 units of PRBC blood transfusion   Requested patient to be admitted for close observation.   Note: On arrival Tmax 100.7, no signs of infection, lactic acid 1.0, WBC of 4.0, respiratory panel negative.     Patient Denies having: Fever, Chills, Cough, SOB, Chest Pain, Abd pain, N/V/D, headache, dizziness, lightheadedness,  Dysuria, Joint pain, rash, open wounds  Review of Systems: As per HPI, otherwise 10 point review of systems were negative.     Neutropenic fever, concern for UTI: Note covid PCR negative, CXR clear, no cutaneous or other sources of infection noted at this time. Fever preceded transfusions and is ongoing long after transfusions completed. ESR 31, CRP 1.1, PCT 0.97. WBC was borderline and has declined with ANC of 1.1 on 7/22. Normotensive, lactic acid normal.   - Discussed again with heme/onc, Dr. Davonna whose assistance is appreciated.  SABRA She will attempt to get  in contact with oncology at [facility] as his fevers may be from disease progression.  - Blood cultures from 7/17 remain NGTD. Repeat blood cultures from 07/08/2024 NGTD - Repeat blood cultures with persistent fevers, now neutropenic fever.   - Change CTX to cefepime . MRSA PCR is negative, there is low suspicion for MRSA (catheter-related infection, skin or soft-tissue infection, pneumonia, hemodynamic instability, severe oral or pharyngeal mucositis or history of MRSA infection), so defer vancomycin at this time.  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  Hgb 9.7 after receiving total of 3 units of PRBC since 07/03/2024 - No evidence of active bleeding, iron panel shows low TIBC.  - Continue follow up with Dr. Haroldine at the [facility], on luspatercept  q3 weeks.  - Bilirubin elevated with otherwise normal LFTs, smear shows many abnormalities but no schistocytes.    Stage IIIa CKD: Stable, recheck if still here 7/21.  - Avoid nephrotoxins   Depression: Quiescent.  - Continue SSRI, remeron    GERD:  - Continue PPI   Gout:  - Continue allopurinol .    Underweight.    -- Hospital Course--/Assessment & Plan:  1)Neutropenic fever-- Note covid PCR negative, CXR clear, no cutaneous or other sources of infection noted at this time. Fever preceded transfusions and is ongoing long after transfusions completed. ESR 31, CRP 1.1, PCT 0.97. WBC was borderline and has declined with ANC of 1.1 on 7/22. Normotensive, lactic acid normal.   - Discussed again with heme/onc, Dr. Davonna whose assistance is appreciated.  SABRA She will attempt to get in contact with oncology at [facility] as his fevers may be from disease progression.  - Blood cultures from 07/03/24  remain NGTD. Repeat blood cultures from 07/08/2024 NGTD - Treated with CTX to  cefepime . MRSA PCR is negative, there is low suspicion for MRSA (catheter-related infection, skin or soft-tissue infection, pneumonia, hemodynamic instability, severe oral or pharyngeal mucositis or history of MRSA infection), so defer vancomycin at this time.  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  Hgb 9.7 after receiving total of 3 units of PRBC since 07/03/2024 - No evidence of active bleeding, iron panel shows low TIBC.  - [facility]  [facility]3 weeks.  - Bilirubin elevated with otherwise normal LFTs, smear shows many abnormalities but no schistocytes.    Stage IIIa CKD: Stable, recheck if still here 7/21.  - Avoid nephrotoxins   Depression: Quiescent.  - Continue SSRI, remeron    GERD:  - Continue PPI   Gout:  - Continue allopurinol .    Underweight.   Discharge Condition: Stable and afebrile  Follow UP   Consults obtained -oncology  Diet and Activity recommendation:  As advised  Discharge Instructions    Discharge Instructions     Call MD for:  difficulty breathing, headache or visual disturbances   Complete by: As directed    Call MD for:  persistant dizziness or light-headedness   Complete by: As directed    Call MD for:  persistant nausea and vomiting   Complete by: As directed    Call MD for:  severe uncontrolled pain   Complete by: As directed    Call MD for:  temperature >100.4   Complete by: As directed    Diet - low sodium heart healthy   Complete by: As directed    Discharge instructions   Complete by: As directed    1)Avoid ibuprofen/Advil/Aleve/Motrin/Goody Powders/Naproxen/BC powders/Meloxicam/Diclofenac/Indomethacin  and other Nonsteroidal anti-inflammatory medications as these will make you  more likely to bleed and can cause stomach ulcers, can also cause Kidney problems.   2) please follow-up with oncologist at the [facility] in [place] for repeat CBC and CMP blood test and for possible bone marrow biopsy   Increase activity slowly   Complete by: As directed         Discharge Medications     Allergies as of 07/10/2024   No Known Allergies      Medication List     STOP taking these medications    aspirin -sod bicarb-citric acid 325 MG Tbef tablet Commonly known as: ALKA-SELTZER       TAKE these medications    allopurinol  300 MG tablet Commonly known as: ZYLOPRIM  Take 300 mg by mouth at bedtime.   aspirin  EC 81 MG tablet Take 1 tablet (81 mg total) by mouth daily with breakfast. Swallow whole. What changed: when to take this   calcium -vitamin D 500-200 MG-UNIT tablet Commonly known as: OSCAL WITH D Take 1 tablet by mouth.   cyanocobalamin  500 MCG tablet Commonly known as: VITAMIN B12 Take 2 tablets by mouth daily.   fluticasone 50 MCG/ACT nasal spray Commonly known as: FLONASE Place 2 sprays into both nostrils daily.   latanoprost 0.005 % ophthalmic solution Commonly known as: XALATAN Apply 1 drop to eye at bedtime.   loratadine 10 MG tablet Commonly known as: CLARITIN Take 10 mg by mouth daily.   MELATONIN PO Take 1 mg by mouth at bedtime.   methocarbamol 500 MG tablet Commonly known as: ROBAXIN Take 500 mg by mouth in the morning and at bedtime.   mirtazapine  15 MG tablet Commonly known as: REMERON  Take 15 mg by mouth at bedtime.   multivitamin capsule Take by mouth.   pantoprazole  40 MG tablet Commonly known as: PROTONIX  Take 40 mg by mouth daily as needed (heartburn, acid reflux).   polyethylene glycol 17 g packet Commonly known as: MIRALAX  / GLYCOLAX  Take 17 g by mouth daily as needed for mild constipation.   sertraline  50 MG tablet Commonly known as: ZOLOFT  Take 50 mg by mouth daily.        Major procedures and  Radiology Reports - PLEASE review detailed and  final reports for all details, in brief -   CT CHEST ABDOMEN PELVIS W CONTRAST Result Date: 07/08/2024 CLINICAL DATA:  Fever.  Anemia.  Sepsis EXAM: CT CHEST, ABDOMEN, AND PELVIS WITH CONTRAST TECHNIQUE: Multidetector CT imaging of the chest, abdomen and pelvis was performed following the standard protocol during bolus administration of intravenous contrast. RADIATION DOSE REDUCTION: This exam was performed according to the departmental dose-optimization program which includes automated exposure control, adjustment of the mA and/or kV according to patient size and/or use of iterative reconstruction technique. CONTRAST:  OMNIPAQUE  IOHEXOL  300 MG/ML  SOLN COMPARISON:  Chest x-ray 07/03/2024. Abdominal ultrasound report 08/13/2009 FINDINGS: CT CHEST FINDINGS Cardiovascular: Heart is nonenlarged. Thoracic aorta is normal course and caliber. Trace pericardial fluid. Coronary artery calcifications are noted. Mediastinum/Nodes: Preserved thyroid gland. Normal caliber thoracic esophagus which is slightly patulous. No specific abnormal lymph node enlargement identified in the axillary regions, hilum or mediastinum. Lungs/Pleura: There is some bandlike opacity seen along both lung bases, right-greater-than-left. Atelectasis is favored over infiltrate. Trace right pleural fluid. No frank consolidation, pneumothorax. There is a punctate calcified nodule in the lingula on series 3, image 61 consistent with old granulomatous disease. 2 mm right lower lobe lung nodule identified on series 3, image 103. No larger more dominant lung nodule identified. Musculoskeletal: Curvature of the spine. Scattered degenerative changes. Schmorl's node changes identified. CT ABDOMEN PELVIS FINDINGS Hepatobiliary: No focal liver abnormality is seen. No gallstones, gallbladder wall thickening, or biliary dilatation. Pancreas: Unremarkable. No pancreatic ductal dilatation or surrounding  inflammatory changes. Spleen: Spleen is enlarged with a longitudinal length of 16.7 cm on sagittal imaging. Is also a low-attenuation mass in the along the inferior aspect of the spleen measuring 4.2 by 3.8 cm. Aggressive process is possible. Recommend further evaluation. Adrenals/Urinary Tract: Adrenal glands are preserved. Mild renal atrophy. No enhancing renal mass. There are 2 left-sided cysts identified. The larger focus is seen posteriorly measuring 4.2 cm on series 2, image 84 with Hounsfield unit of 10. Smaller focus towards the upper pole anteriorly also appears to be a simple Bosniak 1 cyst. Tiny poor peripheral focus as well on series 2, image 86. No specific imaging follow-up of these lesions. No collecting system dilatation. The ureters have normal course and caliber extending down to the urinary bladder. Stomach/Bowel: Oral contrast was administered. The stomach is nondilated. There is a question of slight wall thickening at the pylorus. Please correlate for any symptoms. In addition there is slight contour deformity along the lesser curve of the stomach. No obstruction to the flow of oral contrast. Otherwise the small bowel is nondilated. The large bowel has a normal course and caliber with scattered colonic stool. Normal appendix in the right lower quadrant. Vascular/Lymphatic: Normal caliber aorta and IVC. Mild scattered vascular calcifications. There are some prominent retroperitoneal lymph nodes identified but these are less than a cm in short axis. Example portacaval measuring short axis of 7 mm on series 2, image 72 left para-aortic node on image 71 short axis of 6 mm. Reproductive: Prostate is unremarkable. Other: Mild anasarca. No definite free air or free fluid. There is some presacral fat stranding. Musculoskeletal: Scattered degenerative changes along the spine. Slight curvature of the spine as well. Mild degenerative changes along the pelvis. IMPRESSION: Slight enlarged spleen up to 16.7  cm with a 4.2 cm low-attenuation mass. Aggressive process possible including neoplasm but there is a differential. Please correlate with any prior dedicated workup when appropriate such as PET-CT or MRI. No bowel obstruction.  Scattered stool.  Normal appendix. Small but prominent upper retroperitoneal lymph nodes are identified. More numerous than usually seen but not pathologic by size attention on follow-up. Slight wall thickening along the pylorus as well as slight contour deformity along the lesser curve the stomach. Please correlate for any ulcer disease. Please correlate with specific symptoms and further evaluation when appropriate. Bandlike opacity along the bases. Atelectasis is favored of the lung bases. Tiny sub 5 mm right lower lobe lung nodule. If patient is low risk for malignancy, no routine follow-up imaging is recommended. If patient is high risk for malignancy, a non-contrast chest CT at 12 months is optional.This recommendation follows the consensus statement: Guidelines for Management of Incidental Pulmonary Nodules Detected on CT Images: From the Fleischner Society 2017; Radiology 2017; [phone]. Coronary artery calcifications. Electronically Signed   By: Ranell Bring M.D.   On: 07/08/2024 16:35   DG Chest Portable 1 View Result Date: 07/03/2024 CLINICAL DATA:  fever EXAM: PORTABLE CHEST - 1 VIEW COMPARISON:  None available. FINDINGS: No focal airspace consolidation, pleural effusion, or pneumothorax. No cardiomegaly. No acute fracture or destructive lesion. IMPRESSION: No acute cardiopulmonary abnormality. Electronically Signed   By: Rogelia Myers M.D.   On: 07/03/2024 14:32    Micro Results   Recent Results (from the past 240 hours)  Blood culture (routine x 2)     Status: None   Collection Time: 07/03/24 12:12 PM   Specimen: BLOOD  Result Value Ref Range Status   Specimen Description BLOOD LEFT WRIST  Final   Special Requests   Final    Blood Culture results may not be  optimal due to an inadequate volume of blood received in culture bottles BOTTLES DRAWN AEROBIC AND ANAEROBIC   Culture   Final    NO GROWTH 5 DAYS Performed at [facility], [address] [place], [place] [address]    Report Status 07/08/2024 FINAL  Final  Blood culture (routine x 2)     Status: None   Collection Time: 07/03/24 12:53 PM   Specimen: BLOOD  Result Value Ref Range Status   Specimen Description BLOOD BLOOD RIGHT ARM  Final   Special Requests   Final    BOTTLES DRAWN AEROBIC AND ANAEROBIC Blood Culture adequate volume   Culture   Final    NO GROWTH 5 DAYS Performed at [facility], [address], [place], [place] [address]    Report Status 07/08/2024 FINAL  Final  SARS Coronavirus 2 by RT PCR (hospital order, performed in [facility] hospital lab) *cepheid single result test* Anterior Nasal Swab     Status: None   Collection Time: 07/03/24  1:28 PM   Specimen: Anterior Nasal Swab  Result Value Ref Range Status   SARS Coronavirus 2 by RT PCR NEGATIVE NEGATIVE Final    Comment: (NOTE) SARS-CoV-2 target nucleic acids are NOT DETECTED.  The SARS-CoV-2 RNA is generally detectable in upper and lower respiratory specimens during the acute phase of infection. The lowest concentration of SARS-CoV-2 viral copies this assay can detect is 250 copies / mL. A negative result does not preclude SARS-CoV-2 infection and should not be used as the sole basis for treatment or other patient management decisions.  A negative result may occur with improper specimen collection / handling, submission of specimen other than nasopharyngeal swab, presence of viral mutation(s) within the areas targeted by this assay, and inadequate number of viral copies (<250 copies / mL). A negative result must be combined with clinical  observations, patient history, and epidemiological information.  Fact Sheet for Patients:   RoadLapTop.co.za  Fact Sheet for Healthcare  Providers: http://kim-miller.com/  This test is not yet approved or  cleared by the [place]  FDA and has been authorized for detection and/or diagnosis of SARS-CoV-2 by FDA under an Emergency Use Authorization (EUA).  This EUA will remain in effect (meaning this test can be used) for the duration of the COVID-19 declaration under Section 564(b)(1) of the Act, 21 U.S.C. section 360bbb-3(b)(1), unless the authorization is terminated or revoked sooner.  Performed at [facility], [address], [place], [place] [address]   Resp panel by RT-PCR (RSV, Flu A&B, Covid) Anterior Nasal Swab     Status: None   Collection Time: 07/07/24  9:49 AM   Specimen: Anterior Nasal Swab  Result Value Ref Range Status   SARS Coronavirus 2 by RT PCR NEGATIVE NEGATIVE Final    Comment: (NOTE) SARS-CoV-2 target nucleic acids are NOT DETECTED.  The SARS-CoV-2 RNA is generally detectable in upper respiratory specimens during the acute phase of infection. The lowest concentration of SARS-CoV-2 viral copies this assay can detect is 138 copies/mL. A negative result does not preclude SARS-Cov-2 infection and should not be used as the sole basis for treatment or other patient management decisions. A negative result may occur with  improper specimen collection/handling, submission of specimen other than nasopharyngeal swab, presence of viral mutation(s) within the areas targeted by this assay, and inadequate number of viral copies(<138 copies/mL). A negative result must be combined with clinical observations, patient history, and epidemiological information. The expected result is Negative.  Fact Sheet for Patients:  BloggerCourse.com  Fact Sheet for Healthcare Providers:  SeriousBroker.it  This test is no t yet approved or cleared by the [place]  FDA and  has been authorized for detection and/or diagnosis of SARS-CoV-2 by FDA  under an Emergency Use Authorization (EUA). This EUA will remain  in effect (meaning this test can be used) for the duration of the COVID-19 declaration under Section 564(b)(1) of the Act, 21 U.S.C.section 360bbb-3(b)(1), unless the authorization is terminated  or revoked sooner.       Influenza A by PCR NEGATIVE NEGATIVE Final   Influenza B by PCR NEGATIVE NEGATIVE Final    Comment: (NOTE) The Xpert Xpress SARS-CoV-2/FLU/RSV plus assay is intended as an aid in the diagnosis of influenza from Nasopharyngeal swab specimens and should not be used as a sole basis for treatment. Nasal washings and aspirates are unacceptable for Xpert Xpress SARS-CoV-2/FLU/RSV testing.  Fact Sheet for Patients: BloggerCourse.com  Fact Sheet for Healthcare Providers: SeriousBroker.it  This test is not yet approved or cleared by the [place]  FDA and has been authorized for detection and/or diagnosis of SARS-CoV-2 by FDA under an Emergency Use Authorization (EUA). This EUA will remain in effect (meaning this test can be used) for the duration of the COVID-19 declaration under Section 564(b)(1) of the Act, 21 U.S.C. section 360bbb-3(b)(1), unless the authorization is terminated or revoked.     Resp Syncytial Virus by PCR NEGATIVE NEGATIVE Final    Comment: (NOTE) Fact Sheet for Patients: BloggerCourse.com  Fact Sheet for Healthcare Providers: SeriousBroker.it  This test is not yet approved or cleared by the [place]  FDA and has been authorized for detection and/or diagnosis of SARS-CoV-2 by FDA under an Emergency Use Authorization (EUA). This EUA will remain in effect (meaning this test can be used) for the duration of the COVID-19 declaration under Section 564(b)(1) of the Act,  21 U.S.C. section 360bbb-3(b)(1), unless the authorization is terminated or revoked.  Performed at [facility], [address], [place], [place] [address]   Respiratory (~20 pathogens) panel by PCR     Status: None   Collection Time: 07/07/24  9:49 AM   Specimen: Nasopharyngeal Swab; Respiratory  Result Value Ref Range Status   Adenovirus NOT DETECTED NOT DETECTED Final   Coronavirus 229E NOT DETECTED NOT DETECTED Final    Comment: (NOTE) The Coronavirus on the Respiratory Panel, DOES NOT test for the novel  Coronavirus (2019 nCoV)    Coronavirus HKU1 NOT DETECTED NOT DETECTED Final   Coronavirus NL63 NOT DETECTED NOT DETECTED Final   Coronavirus OC43 NOT DETECTED NOT DETECTED Final   Metapneumovirus NOT DETECTED NOT DETECTED Final   Rhinovirus / Enterovirus NOT DETECTED NOT DETECTED Final   Influenza A NOT DETECTED NOT DETECTED Final   Influenza B NOT DETECTED NOT DETECTED Final   Parainfluenza Virus 1 NOT DETECTED NOT DETECTED Final   Parainfluenza Virus 2 NOT DETECTED NOT DETECTED Final   Parainfluenza Virus 3 NOT DETECTED NOT DETECTED Final   Parainfluenza Virus 4 NOT DETECTED NOT DETECTED Final   Respiratory Syncytial Virus NOT DETECTED NOT DETECTED Final   Bordetella pertussis NOT DETECTED NOT DETECTED Final   Bordetella Parapertussis NOT DETECTED NOT DETECTED Final   Chlamydophila pneumoniae NOT DETECTED NOT DETECTED Final   Mycoplasma pneumoniae NOT DETECTED NOT DETECTED Final    Comment: Performed at [facility], [address], [place], [place] [address]  MRSA Next Gen by PCR, Nasal     Status: None   Collection Time: 07/07/24  3:20 PM   Specimen: Nasal Mucosa; Nasal Swab  Result Value Ref Range Status   MRSA by PCR Next Gen NOT DETECTED NOT DETECTED Final    Comment: (NOTE) The GeneXpert MRSA Assay (FDA approved for NASAL specimens only), is one component of a comprehensive MRSA colonization surveillance program. It is not intended to diagnose MRSA infection nor to guide or monitor treatment for MRSA infections. Test performance is not FDA approved in patients less  than 17 years old. Performed at [facility], [address], [place], [place] [address]   Culture, blood (Routine X 2) w Reflex to ID Panel     Status: None (Preliminary result)   Collection Time: 07/08/24  7:28 AM   Specimen: BLOOD  Result Value Ref Range Status   Specimen Description BLOOD RIGHT ANTECUBITAL  Final   Special Requests   Final    BOTTLES DRAWN AEROBIC AND ANAEROBIC Blood Culture adequate volume   Culture   Final    NO GROWTH 2 DAYS Performed at [facility], [address], [place], [place] [address]    Report Status PENDING  Incomplete  Culture, blood (Routine X 2) w Reflex to ID Panel     Status: None (Preliminary result)   Collection Time: 07/08/24  7:33 AM   Specimen: BLOOD  Result Value Ref Range Status   Specimen Description BLOOD BLOOD RIGHT HAND  Final   Special Requests   Final    BOTTLES DRAWN AEROBIC AND ANAEROBIC Blood Culture adequate volume   Culture   Final    NO GROWTH 2 DAYS Performed at [facility], [address] [place], [place] [address]    Report Status PENDING  Incomplete    Today   Subjective    Alp Goldwater today has no new concerns - No further fevers  No Nausea, Vomiting or Diarrhea  No dyspnea no chest pains no palpitations no dizziness    Patient has been seen and examined prior to discharge   Objective   Blood pressure 114/68, pulse 79, temperature 98.7 F (37.1 C), temperature source Oral, resp. rate 20, height 6' 1 (1.854 m), weight 59.7 kg, SpO2 99%.   Intake/Output Summary (Last 24 hours) at 07/10/2024 1133 Last data filed at 07/10/2024 9362 Gross per 24 hour  Intake 763 ml  Output --  Net 763 ml    Exam Gen:- Awake Alert, no acute distress  HEENT:- .AT, No sclera icterus Neck-Supple Neck,No JVD,.  Lungs-  CTAB , good air movement bilaterally CV- S1, S2 normal, regular Abd-  +ve B.Sounds, Abd Soft, No tenderness,    Extremity/Skin:- No  edema,   good pulses Psych-affect is appropriate, oriented  x3 Neuro-no new focal deficits, no tremors    Data Review   CBC w Diff:  Lab Results  Component Value Date   WBC 2.8 (L) 07/09/2024   HGB 9.7 (L) 07/09/2024   HCT 31.2 (L) 07/09/2024   PLT 253 07/09/2024   LYMPHOPCT 34 07/09/2024   BANDSPCT 3 07/08/2024   MONOPCT 11 07/09/2024   EOSPCT 1 07/09/2024   BASOPCT 1 07/09/2024    CMP:  Lab Results  Component Value Date   NA 135 07/09/2024   K 4.3 07/09/2024   CL 100 07/09/2024   CO2 22 07/09/2024   BUN 18 07/09/2024   CREATININE 1.11 07/09/2024   CREATININE 1.24 08/07/2011   PROT 6.6 07/09/2024   ALBUMIN 3.5 07/09/2024   BILITOT 1.4 (H) 07/09/2024   ALKPHOS 92 07/09/2024   AST 45 (H) 07/09/2024   ALT 21 07/09/2024  .  Total Discharge time is about 33 minutes  Rendall Carwin M.D on 07/10/2024 at 11:33 AM  Go to [email or web address] -  for contact info  [facility] - Office  [phone]

## 2024-07-10 NOTE — Discharge Instructions (Signed)
 1)Avoid ibuprofen/Advil/Aleve/Motrin/Goody Powders/Naproxen/BC powders/Meloxicam/Diclofenac/Indomethacin  and other Nonsteroidal anti-inflammatory medications as these will make you more likely to bleed and can cause stomach ulcers, can also cause Kidney problems.   2) please follow-up with oncologist at the [facility] in [place] for repeat CBC and CMP blood test and for possible bone marrow biopsy

## 2024-07-10 NOTE — Plan of Care (Signed)
   Problem: Education: Goal: Knowledge of General Education information will improve Description: Including pain rating scale, medication(s)/side effects and non-pharmacologic comfort measures Outcome: Progressing   Problem: Health Behavior/Discharge Planning: Goal: Ability to manage health-related needs will improve Outcome: Progressing   Problem: Clinical Measurements: Goal: Will remain free from infection Outcome: Progressing

## 2024-07-10 NOTE — Progress Notes (Addendum)
 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  Myelofibrosis: Patient was diagnosed with myelofibrosis a few years ago and was on Jakafi before transitioning to luspatercept  in October 2024. -Unknown at this time if he is taking any JAKi.  Patient reports not taking any pills. -Patient follows with oncology at [facility]. No records available at this time.   -Patient has been recently transfusion dependent but there are no B symptoms.  -It is difficult to assess the status of the disease but considering new transfusion dependence and fevers, there is a chance of progression at this time. - Recommended patient to follow-up with his oncologist 1 week after discharge for labs, possible bone marrow biopsy and further discussion of treatment management.  2.  Fever: Infectious workup so far has been negative except for UA that was positive.  Completed course of ceftriaxone .   -Fever curve trending down.   -Differential includes progression of myelofibrosis or treatment refractoriness. CT CAP: Negative for infection Currently on cefepime   - Patient had 1 episode of low-grade fever overnight. - If patient is vitally stable otherwise and no infectious source found, can discontinue antibiotics and discharge home to follow up with out patient hematologist - He will need outpatient bone marrow biopsy to assess for progression of disease  3.  Anemia: Likely secondary to disease progression or side effect of medication. No CBC from today available at this time.  - Transfuse for hemoglobin less than 7 - Continue to trend CBC with differential  daily  I discussed the above plan in detail with the patient and daughter.  They are agreeable to see hematologist as soon as possible.  Thank you for involving us  in this patient care. All questions were answered.  Please reach out with any questions or concerns.  35 minutes of total time was spent for this patient encounter, including preparation, face-to-face counseling with the patient and coordination of care, physical exam, and documentation of the encounter.  Mickiel Dry, MD Hematology/Oncology [facility]  HISTORY OF PRESENT ILLNESS:   Patient is a 73 year old male with past medical history of myelofibrosis on luspatercept , GERD, depression Presented to the ED at the advice of PCP due to transfusion for hemoglobin of 6.3.  He had a hemoglobin of 5.9 in the ED and received 2 units of packed red blood cells. Patient developed fever with UTI symptoms while in the ER and was given ceftriaxone . Hematology consulted for unresolving fever in the setting of myelofibrosis.  INTERVAL HISTORY:  Patient has no complaints today. Overall is feeling very well.  I saw daughter at bedside.  We discussed in detail that patient has no evidence of infection at this time and fevers are likely secondary to his myelofibrosis and he needs outpatient workup including a bone marrow biopsy and follow-up with his oncologist.   PERFORMANCE STATUS: The patient's performance status is 2 - Symptomatic, <50% confined to bed  PHYSICAL EXAM: Most Recent Vital Signs: Blood pressure 114/68, pulse 79, temperature (!) 100.7 F (38.2 C), temperature source Oral, resp. rate 20, height 6' 1 (1.854 m), weight 131 lb 9.8 oz (  59.7 kg), SpO2 99%.  GENERAL:alert, no distress and comfortable, frail male SKIN: skin color, texture, turgor are normal, no rashes or significant lesions LYMPH:  no palpable lymphadenopathy in the cervical, axillary or inguinal LUNGS: clear to auscultation and  percussion with normal breathing effort HEART: regular rate & rhythm and no murmurs and no lower extremity edema ABDOMEN:abdomen soft, non-tender and normal bowel sounds Musculoskeletal:no cyanosis of digits and no clubbing  PSYCH: alert & oriented x 3 with fluent speech NEURO: no focal motor/sensory deficits   LABORATORY DATA:  Results for orders placed or performed during the hospital encounter of 07/03/24 (from the past 48 hours)  Prepare RBC (crossmatch)     Status: None   Collection Time: 07/08/24  9:38 AM  Result Value Ref Range   Order Confirmation      ORDER PROCESSED BY BLOOD BANK Performed at [facility], [address], [place], [place] [address]   CBC with Differential/Platelet     Status: Abnormal   Collection Time: 07/09/24  4:13 AM  Result Value Ref Range   WBC 2.8 (L) 4.0 - 10.5 K/uL   RBC 3.25 (L) 4.22 - 5.81 MIL/uL   Hemoglobin 9.7 (L) 13.0 - 17.0 g/dL    Comment: REPEATED TO VERIFY POST TRANSFUSION SPECIMEN    HCT 31.2 (L) 39.0 - 52.0 %   MCV 96.0 80.0 - 100.0 fL   MCH 29.8 26.0 - 34.0 pg   MCHC 31.1 30.0 - 36.0 g/dL   RDW 80.9 (H) 88.4 - 84.4 %   Platelets 253 150 - 400 K/uL   nRBC 1.8 (H) 0.0 - 0.2 %   Neutrophils Relative % 50 %   Neutro Abs 1.4 (L) 1.7 - 7.7 K/uL   Lymphocytes Relative 34 %   Lymphs Abs 1.0 0.7 - 4.0 K/uL   Monocytes Relative 11 %   Monocytes Absolute 0.3 0.1 - 1.0 K/uL   Eosinophils Relative 1 %   Eosinophils Absolute 0.0 0.0 - 0.5 K/uL   Basophils Relative 1 %   Basophils Absolute 0.0 0.0 - 0.1 K/uL   WBC Morphology Marked Left Shift (>5% metas, myelos and pros)     Comment: See Note BLASTS PRESENT    Immature Granulocytes 3 %   Abs Immature Granulocytes 0.09 (H) 0.00 - 0.07 K/uL   Abnormal Lymphocytes Present PRESENT    Reactive, Benign Lymphocytes PRESENT    Acanthocytes PRESENT    Tear Drop Cells PRESENT    Polychromasia PRESENT     Comment: Performed at [facility], [address], [place], [place] [address]   Comprehensive metabolic panel with GFR     Status: Abnormal   Collection Time: 07/09/24  4:13 AM  Result Value Ref Range   Sodium 135 135 - 145 mmol/L   Potassium 4.3 3.5 - 5.1 mmol/L   Chloride 100 98 - 111 mmol/L   CO2 22 22 - 32 mmol/L   Glucose, Bld 99 70 - 99 mg/dL    Comment: Glucose reference range applies only to samples taken after fasting for at least 8 hours.   BUN 18 8 - 23 mg/dL   Creatinine, Ser 8.88 0.61 - 1.24 mg/dL   Calcium  8.4 (L) 8.9 - 10.3 mg/dL   Total Protein 6.6 6.5 - 8.1 g/dL   Albumin 3.5 3.5 - 5.0 g/dL   AST 45 (H) 15 - 41 U/L   ALT 21 0 - 44 U/L   Alkaline Phosphatase 92 38 - 126 U/L   Total Bilirubin  1.4 (H) 0.0 - 1.2 mg/dL   GFR, Estimated >39 >39 mL/min    Comment: (NOTE) Calculated using the CKD-EPI Creatinine Equation (2021)    Anion gap 13 5 - 15    Comment: Performed at [facility], [address], [place], [place] [address]      RADIOGRAPHY:  CT CHEST ABDOMEN PELVIS W CONTRAST CLINICAL DATA:  Fever.  Anemia.  Sepsis  EXAM: CT CHEST, ABDOMEN, AND PELVIS WITH CONTRAST  TECHNIQUE: Multidetector CT imaging of the chest, abdomen and pelvis was performed following the standard protocol during bolus administration of intravenous contrast.  RADIATION DOSE REDUCTION: This exam was performed according to the departmental dose-optimization program which includes automated exposure control, adjustment of the mA and/or kV according to patient size and/or use of iterative reconstruction technique.  CONTRAST:  OMNIPAQUE  IOHEXOL  300 MG/ML  SOLN  COMPARISON:  Chest x-ray 07/03/2024. Abdominal ultrasound report 08/13/2009  FINDINGS: CT CHEST FINDINGS  Cardiovascular: Heart is nonenlarged. Thoracic aorta is normal course and caliber. Trace pericardial fluid. Coronary artery calcifications are noted.  Mediastinum/Nodes: Preserved thyroid gland. Normal caliber thoracic esophagus which is slightly patulous. No specific abnormal  lymph node enlargement identified in the axillary regions, hilum or mediastinum.  Lungs/Pleura: There is some bandlike opacity seen along both lung bases, right-greater-than-left. Atelectasis is favored over infiltrate. Trace right pleural fluid. No frank consolidation, pneumothorax. There is a punctate calcified nodule in the lingula on series 3, image 2 consistent with old granulomatous disease. 2 mm right lower lobe lung nodule identified on series 3, image 103. No larger more dominant lung nodule identified.  Musculoskeletal: Curvature of the spine. Scattered degenerative changes. Schmorl's node changes identified.  CT ABDOMEN PELVIS FINDINGS  Hepatobiliary: No focal liver abnormality is seen. No gallstones, gallbladder wall thickening, or biliary dilatation.  Pancreas: Unremarkable. No pancreatic ductal dilatation or surrounding inflammatory changes.  Spleen: Spleen is enlarged with a longitudinal length of 16.7 cm on sagittal imaging. Is also a low-attenuation mass in the along the inferior aspect of the spleen measuring 4.2 by 3.8 cm. Aggressive process is possible. Recommend further evaluation.  Adrenals/Urinary Tract: Adrenal glands are preserved. Mild renal atrophy. No enhancing renal mass. There are 2 left-sided cysts identified. The larger focus is seen posteriorly measuring 4.2 cm on series 2, image 84 with Hounsfield unit of 10. Smaller focus towards the upper pole anteriorly also appears to be a simple Bosniak 1 cyst. Tiny poor peripheral focus as well on series 2, image 86. No specific imaging follow-up of these lesions. No collecting system dilatation. The ureters have normal course and caliber extending down to the urinary bladder.  Stomach/Bowel: Oral contrast was administered. The stomach is nondilated. There is a question of slight wall thickening at the pylorus. Please correlate for any symptoms. In addition there is slight contour deformity along the  lesser curve of the stomach. No obstruction to the flow of oral contrast. Otherwise the small bowel is nondilated. The large bowel has a normal course and caliber with scattered colonic stool. Normal appendix in the right lower quadrant.  Vascular/Lymphatic: Normal caliber aorta and IVC. Mild scattered vascular calcifications. There are some prominent retroperitoneal lymph nodes identified but these are less than a cm in short axis. Example portacaval measuring short axis of 7 mm on series 2, image 72 left para-aortic node on image 71 short axis of 6 mm.  Reproductive: Prostate is unremarkable.  Other: Mild anasarca. No definite free air or free fluid. There is some presacral fat  stranding.  Musculoskeletal: Scattered degenerative changes along the spine. Slight curvature of the spine as well. Mild degenerative changes along the pelvis.  IMPRESSION: Slight enlarged spleen up to 16.7 cm with a 4.2 cm low-attenuation mass. Aggressive process possible including neoplasm but there is a differential. Please correlate with any prior dedicated workup when appropriate such as PET-CT or MRI.  No bowel obstruction.  Scattered stool.  Normal appendix.  Small but prominent upper retroperitoneal lymph nodes are identified. More numerous than usually seen but not pathologic by size attention on follow-up.  Slight wall thickening along the pylorus as well as slight contour deformity along the lesser curve the stomach. Please correlate for any ulcer disease. Please correlate with specific symptoms and further evaluation when appropriate.  Bandlike opacity along the bases. Atelectasis is favored of the lung bases.  Tiny sub 5 mm right lower lobe lung nodule. If patient is low risk for malignancy, no routine follow-up imaging is recommended. If patient is high risk for malignancy, a non-contrast chest CT at 12 months is optional.This recommendation follows the consensus statement:  Guidelines for Management of Incidental Pulmonary Nodules Detected on CT Images: From the Fleischner Society 2017; Radiology 2017; [phone].  Coronary artery calcifications.  Electronically Signed   By: Ranell Bring M.D.   On: 07/08/2024 16:35

## 2024-07-10 NOTE — Care Management Important Message (Signed)
 Important Message  Patient Details  Name: Ricardo [name] MRN: [id number] Date of Birth: 08-10-1951   Important Message Given:  Yes - Medicare IM     Heather Mckendree L Sheria Rosello 07/10/2024, 10:23 AM

## 2024-07-13 LAB — CULTURE, BLOOD (ROUTINE X 2)
Culture: NO GROWTH
Culture: NO GROWTH
Special Requests: ADEQUATE
Special Requests: ADEQUATE

## 2024-07-17 ENCOUNTER — Encounter: Payer: Self-pay | Admitting: Internal Medicine

## 2024-10-01 ENCOUNTER — Inpatient Hospital Stay: Admission: RE | Admit: 2024-10-01 | Discharge: 2024-12-19 | Attending: Internal Medicine | Admitting: Internal Medicine

## 2024-10-01 ENCOUNTER — Institutional Professional Consult (permissible substitution) (HOSPITAL_COMMUNITY)

## 2024-10-01 MED ORDER — DIATRIZOATE MEGLUMINE & SODIUM 66-10 % PO SOLN: 30.0000 mL | Freq: Once | ORAL | Status: DC

## 2024-10-01 MED ORDER — DIATRIZOATE MEGLUMINE & SODIUM 66-10 % PO SOLN
30.0000 mL | Freq: Once | ORAL | Status: AC
  Administered 2024-10-01: 30 mL

## 2024-10-02 LAB — CBC WITH DIFFERENTIAL/PLATELET
Abs Immature Granulocytes: 0.89 K/uL — ABNORMAL HIGH (ref 0.00–0.07)
Basophils Absolute: 0.1 K/uL (ref 0.0–0.1)
Basophils Relative: 1 %
Eosinophils Absolute: 0.1 K/uL (ref 0.0–0.5)
Eosinophils Relative: 1 %
HCT: 23.9 % — ABNORMAL LOW (ref 39.0–52.0)
Hemoglobin: 7.5 g/dL — ABNORMAL LOW (ref 13.0–17.0)
Immature Granulocytes: 4 %
Lymphocytes Relative: 4 %
Lymphs Abs: 0.9 K/uL (ref 0.7–4.0)
MCH: 31.1 pg (ref 26.0–34.0)
MCHC: 31.4 g/dL (ref 30.0–36.0)
MCV: 99.2 fL (ref 80.0–100.0)
Monocytes Absolute: 0.3 K/uL (ref 0.1–1.0)
Monocytes Relative: 2 %
Neutro Abs: 18 K/uL — ABNORMAL HIGH (ref 1.7–7.7)
Neutrophils Relative %: 88 %
Platelets: 251 K/uL (ref 150–400)
RBC: 2.41 MIL/uL — ABNORMAL LOW (ref 4.22–5.81)
RDW: 17.2 % — ABNORMAL HIGH (ref 11.5–15.5)
WBC: 20.3 K/uL — ABNORMAL HIGH (ref 4.0–10.5)
nRBC: 0.1 % (ref 0.0–0.2)

## 2024-10-02 LAB — COMPREHENSIVE METABOLIC PANEL WITH GFR
ALT: 9 U/L (ref 0–44)
AST: 21 U/L (ref 15–41)
Albumin: 1.6 g/dL — ABNORMAL LOW (ref 3.5–5.0)
Alkaline Phosphatase: 187 U/L — ABNORMAL HIGH (ref 38–126)
Anion gap: 16 — ABNORMAL HIGH (ref 5–15)
BUN: 78 mg/dL — ABNORMAL HIGH (ref 8–23)
CO2: 22 mmol/L (ref 22–32)
Calcium: 8.2 mg/dL — ABNORMAL LOW (ref 8.9–10.3)
Chloride: 98 mmol/L (ref 98–111)
Creatinine, Ser: 3.3 mg/dL — ABNORMAL HIGH (ref 0.61–1.24)
GFR, Estimated: 19 mL/min — ABNORMAL LOW (ref >60)
Glucose, Bld: 88 mg/dL (ref 70–99)
Potassium: 4 mmol/L (ref 3.5–5.1)
Sodium: 136 mmol/L (ref 135–145)
Total Bilirubin: 0.7 mg/dL (ref 0.0–1.2)
Total Protein: 6.2 g/dL — ABNORMAL LOW (ref 6.5–8.1)

## 2024-10-02 LAB — HEPATITIS B SURFACE ANTIGEN: Hepatitis B Surface Ag: NONREACTIVE

## 2024-10-02 LAB — MAGNESIUM: Magnesium: 1.9 mg/dL (ref 1.7–2.4)

## 2024-10-02 LAB — HEPATITIS B SURFACE ANTIBODY,QUALITATIVE: Hep B S Ab: NONREACTIVE

## 2024-10-02 LAB — PHOSPHORUS: Phosphorus: 2.6 mg/dL (ref 2.5–4.6)

## 2024-10-03 LAB — CBC WITH DIFFERENTIAL/PLATELET
Abs Immature Granulocytes: 0.95 K/uL — ABNORMAL HIGH (ref 0.00–0.07)
Basophils Absolute: 0.2 K/uL — ABNORMAL HIGH (ref 0.0–0.1)
Basophils Relative: 1 %
Eosinophils Absolute: 0.2 K/uL (ref 0.0–0.5)
Eosinophils Relative: 1 %
HCT: 21.9 % — ABNORMAL LOW (ref 39.0–52.0)
Hemoglobin: 7 g/dL — ABNORMAL LOW (ref 13.0–17.0)
Immature Granulocytes: 4 %
Lymphocytes Relative: 4 %
Lymphs Abs: 0.9 K/uL (ref 0.7–4.0)
MCH: 30.7 pg (ref 26.0–34.0)
MCHC: 32 g/dL (ref 30.0–36.0)
MCV: 96.1 fL (ref 80.0–100.0)
Monocytes Absolute: 0.7 K/uL (ref 0.1–1.0)
Monocytes Relative: 3 %
Neutro Abs: 19.4 K/uL — ABNORMAL HIGH (ref 1.7–7.7)
Neutrophils Relative %: 87 %
Platelets: 281 K/uL (ref 150–400)
RBC: 2.28 MIL/uL — ABNORMAL LOW (ref 4.22–5.81)
RDW: 17.1 % — ABNORMAL HIGH (ref 11.5–15.5)
WBC: 22.3 K/uL — ABNORMAL HIGH (ref 4.0–10.5)
nRBC: 0.2 % (ref 0.0–0.2)

## 2024-10-03 LAB — RENAL FUNCTION PANEL
Albumin: <1.5 g/dL — ABNORMAL LOW (ref 3.5–5.0)
Anion gap: 11 (ref 5–15)
BUN: 86 mg/dL — ABNORMAL HIGH (ref 8–23)
CO2: 26 mmol/L (ref 22–32)
Calcium: 8.3 mg/dL — ABNORMAL LOW (ref 8.9–10.3)
Chloride: 101 mmol/L (ref 98–111)
Creatinine, Ser: 3.72 mg/dL — ABNORMAL HIGH (ref 0.61–1.24)
GFR, Estimated: 17 mL/min — ABNORMAL LOW (ref >60)
Glucose, Bld: 113 mg/dL — ABNORMAL HIGH (ref 70–99)
Phosphorus: 2.6 mg/dL (ref 2.5–4.6)
Potassium: 3.7 mmol/L (ref 3.5–5.1)
Sodium: 138 mmol/L (ref 135–145)

## 2024-10-03 LAB — PREPARE RBC (CROSSMATCH)

## 2024-10-04 LAB — CBC
HCT: 23.6 % — ABNORMAL LOW (ref 39.0–52.0)
Hemoglobin: 7.3 g/dL — ABNORMAL LOW (ref 13.0–17.0)
MCH: 29.6 pg (ref 26.0–34.0)
MCHC: 30.9 g/dL (ref 30.0–36.0)
MCV: 95.5 fL (ref 80.0–100.0)
Platelets: 209 K/uL (ref 150–400)
RBC: 2.47 MIL/uL — ABNORMAL LOW (ref 4.22–5.81)
RDW: 18.2 % — ABNORMAL HIGH (ref 11.5–15.5)
WBC: 19.2 K/uL — ABNORMAL HIGH (ref 4.0–10.5)
nRBC: 0.1 % (ref 0.0–0.2)

## 2024-10-05 LAB — OCCULT BLOOD X 1 CARD TO LAB, STOOL: Fecal Occult Bld: NEGATIVE

## 2024-10-06 LAB — PREPARE RBC (CROSSMATCH)

## 2024-10-06 LAB — CBC WITH DIFFERENTIAL/PLATELET
Abs Immature Granulocytes: 0.58 K/uL — ABNORMAL HIGH (ref 0.00–0.07)
Basophils Absolute: 0.3 K/uL — ABNORMAL HIGH (ref 0.0–0.1)
Basophils Relative: 2 %
Eosinophils Absolute: 0.2 K/uL (ref 0.0–0.5)
Eosinophils Relative: 1 %
HCT: 21.3 % — ABNORMAL LOW (ref 39.0–52.0)
Hemoglobin: 6.7 g/dL — CL (ref 13.0–17.0)
Immature Granulocytes: 3 %
Lymphocytes Relative: 5 %
Lymphs Abs: 1 K/uL (ref 0.7–4.0)
MCH: 30.5 pg (ref 26.0–34.0)
MCHC: 31.5 g/dL (ref 30.0–36.0)
MCV: 96.8 fL (ref 80.0–100.0)
Monocytes Absolute: 0.8 K/uL (ref 0.1–1.0)
Monocytes Relative: 4 %
Neutro Abs: 16.2 K/uL — ABNORMAL HIGH (ref 1.7–7.7)
Neutrophils Relative %: 85 %
Platelets: 224 K/uL (ref 150–400)
RBC: 2.2 MIL/uL — ABNORMAL LOW (ref 4.22–5.81)
RDW: 17.3 % — ABNORMAL HIGH (ref 11.5–15.5)
WBC: 19.1 K/uL — ABNORMAL HIGH (ref 4.0–10.5)
nRBC: 0.2 % (ref 0.0–0.2)

## 2024-10-06 LAB — PARATHYROID HORMONE, INTACT (NO CA): PTH: 34 pg/mL (ref 15–65)

## 2024-10-06 LAB — HEMOGLOBIN AND HEMATOCRIT, BLOOD
HCT: 24.1 % — ABNORMAL LOW (ref 39.0–52.0)
Hemoglobin: 7.8 g/dL — ABNORMAL LOW (ref 13.0–17.0)

## 2024-10-06 LAB — RENAL FUNCTION PANEL
Albumin: <1.5 g/dL — ABNORMAL LOW (ref 3.5–5.0)
Anion gap: 10 (ref 5–15)
BUN: 82 mg/dL — ABNORMAL HIGH (ref 8–23)
CO2: 28 mmol/L (ref 22–32)
Calcium: 8.1 mg/dL — ABNORMAL LOW (ref 8.9–10.3)
Chloride: 100 mmol/L (ref 98–111)
Creatinine, Ser: 3.35 mg/dL — ABNORMAL HIGH (ref 0.61–1.24)
GFR, Estimated: 19 mL/min — ABNORMAL LOW (ref >60)
Glucose, Bld: 110 mg/dL — ABNORMAL HIGH (ref 70–99)
Phosphorus: 2.4 mg/dL — ABNORMAL LOW (ref 2.5–4.6)
Potassium: 3.6 mmol/L (ref 3.5–5.1)
Sodium: 138 mmol/L (ref 135–145)

## 2024-10-06 LAB — BASIC METABOLIC PANEL WITH GFR
Anion gap: 11 (ref 5–15)
BUN: 82 mg/dL — ABNORMAL HIGH (ref 8–23)
CO2: 28 mmol/L (ref 22–32)
Calcium: 8.2 mg/dL — ABNORMAL LOW (ref 8.9–10.3)
Chloride: 100 mmol/L (ref 98–111)
Creatinine, Ser: 3.42 mg/dL — ABNORMAL HIGH (ref 0.61–1.24)
GFR, Estimated: 19 mL/min — ABNORMAL LOW (ref >60)
Glucose, Bld: 109 mg/dL — ABNORMAL HIGH (ref 70–99)
Potassium: 3.6 mmol/L (ref 3.5–5.1)
Sodium: 139 mmol/L (ref 135–145)

## 2024-10-06 LAB — TYPE AND SCREEN
ABO/RH(D): O NEG
Antibody Screen: NEGATIVE

## 2024-10-07 LAB — TYPE AND SCREEN
ABO/RH(D): O NEG
Antibody Screen: NEGATIVE
Unit division: 0
Unit division: 0

## 2024-10-07 LAB — BPAM RBC
Blood Product Expiration Date: 202510272359
Blood Product Expiration Date: 202510252359
ISSUE DATE / TIME: 202510200916
ISSUE DATE / TIME: 202510171358
Unit Type and Rh: 9500
Unit Type and Rh: 9500

## 2024-10-08 LAB — CBC WITH DIFFERENTIAL/PLATELET
Abs Immature Granulocytes: 0.47 K/uL — ABNORMAL HIGH (ref 0.00–0.07)
Basophils Absolute: 0.4 K/uL — ABNORMAL HIGH (ref 0.0–0.1)
Basophils Relative: 2 %
Eosinophils Absolute: 0.2 K/uL (ref 0.0–0.5)
Eosinophils Relative: 1 %
HCT: 23 % — ABNORMAL LOW (ref 39.0–52.0)
Hemoglobin: 7.2 g/dL — ABNORMAL LOW (ref 13.0–17.0)
Immature Granulocytes: 2 %
Lymphocytes Relative: 5 %
Lymphs Abs: 1.1 K/uL (ref 0.7–4.0)
MCH: 30.3 pg (ref 26.0–34.0)
MCHC: 31.3 g/dL (ref 30.0–36.0)
MCV: 96.6 fL (ref 80.0–100.0)
Monocytes Absolute: 0.9 K/uL (ref 0.1–1.0)
Monocytes Relative: 5 %
Neutro Abs: 17 K/uL — ABNORMAL HIGH (ref 1.7–7.7)
Neutrophils Relative %: 85 %
Platelets: 201 K/uL (ref 150–400)
RBC: 2.38 MIL/uL — ABNORMAL LOW (ref 4.22–5.81)
RDW: 16.9 % — ABNORMAL HIGH (ref 11.5–15.5)
Smear Review: NORMAL
WBC: 20.1 K/uL — ABNORMAL HIGH (ref 4.0–10.5)
nRBC: 0.1 % (ref 0.0–0.2)

## 2024-10-08 LAB — RENAL FUNCTION PANEL
Albumin: <1.5 g/dL — ABNORMAL LOW (ref 3.5–5.0)
Anion gap: 10 (ref 5–15)
BUN: 63 mg/dL — ABNORMAL HIGH (ref 8–23)
CO2: 27 mmol/L (ref 22–32)
Calcium: 8.2 mg/dL — ABNORMAL LOW (ref 8.9–10.3)
Chloride: 102 mmol/L (ref 98–111)
Creatinine, Ser: 2.89 mg/dL — ABNORMAL HIGH (ref 0.61–1.24)
GFR, Estimated: 23 mL/min — ABNORMAL LOW
Glucose, Bld: 126 mg/dL — ABNORMAL HIGH (ref 70–99)
Phosphorus: 1.7 mg/dL — ABNORMAL LOW (ref 2.5–4.6)
Potassium: 3.9 mmol/L (ref 3.5–5.1)
Sodium: 139 mmol/L (ref 135–145)

## 2024-10-09 ENCOUNTER — Institutional Professional Consult (permissible substitution) (HOSPITAL_COMMUNITY)

## 2024-10-09 LAB — CULTURE, BLOOD (SINGLE)
Culture: NO GROWTH
Culture: NO GROWTH

## 2024-10-10 LAB — RENAL FUNCTION PANEL
Albumin: <1.5 g/dL — ABNORMAL LOW (ref 3.5–5.0)
Anion gap: 9 (ref 5–15)
BUN: 56 mg/dL — ABNORMAL HIGH (ref 8–23)
CO2: 29 mmol/L (ref 22–32)
Calcium: 8 mg/dL — ABNORMAL LOW (ref 8.9–10.3)
Chloride: 102 mmol/L (ref 98–111)
Creatinine, Ser: 2.7 mg/dL — ABNORMAL HIGH (ref 0.61–1.24)
GFR, Estimated: 25 mL/min — ABNORMAL LOW (ref >60)
Glucose, Bld: 111 mg/dL — ABNORMAL HIGH (ref 70–99)
Phosphorus: 3.3 mg/dL (ref 2.5–4.6)
Potassium: 3.7 mmol/L (ref 3.5–5.1)
Sodium: 140 mmol/L (ref 135–145)

## 2024-10-10 LAB — CBC WITH DIFFERENTIAL/PLATELET
Abs Immature Granulocytes: 0.36 K/uL — ABNORMAL HIGH (ref 0.00–0.07)
Basophils Absolute: 0.2 K/uL — ABNORMAL HIGH (ref 0.0–0.1)
Basophils Relative: 1 %
Eosinophils Absolute: 0.2 K/uL (ref 0.0–0.5)
Eosinophils Relative: 1 %
HCT: 20.5 % — ABNORMAL LOW (ref 39.0–52.0)
Hemoglobin: 6.3 g/dL — CL (ref 13.0–17.0)
Immature Granulocytes: 2 %
Lymphocytes Relative: 5 %
Lymphs Abs: 0.8 K/uL (ref 0.7–4.0)
MCH: 30.1 pg (ref 26.0–34.0)
MCHC: 30.7 g/dL (ref 30.0–36.0)
MCV: 98.1 fL (ref 80.0–100.0)
Monocytes Absolute: 0.6 K/uL (ref 0.1–1.0)
Monocytes Relative: 3 %
Neutro Abs: 15.7 K/uL — ABNORMAL HIGH (ref 1.7–7.7)
Neutrophils Relative %: 88 %
Platelets: 160 K/uL (ref 150–400)
RBC: 2.09 MIL/uL — ABNORMAL LOW (ref 4.22–5.81)
RDW: 16.2 % — ABNORMAL HIGH (ref 11.5–15.5)
WBC: 17.8 K/uL — ABNORMAL HIGH (ref 4.0–10.5)
nRBC: 0.1 % (ref 0.0–0.2)

## 2024-10-10 LAB — PREPARE RBC (CROSSMATCH)

## 2024-10-10 LAB — HEMOGLOBIN AND HEMATOCRIT, BLOOD
HCT: 24 % — ABNORMAL LOW (ref 39.0–52.0)
Hemoglobin: 7.8 g/dL — ABNORMAL LOW (ref 13.0–17.0)

## 2024-10-11 LAB — CBC
HCT: 23.3 % — ABNORMAL LOW (ref 39.0–52.0)
Hemoglobin: 7.6 g/dL — ABNORMAL LOW (ref 13.0–17.0)
MCH: 30.3 pg (ref 26.0–34.0)
MCHC: 32.6 g/dL (ref 30.0–36.0)
MCV: 92.8 fL (ref 80.0–100.0)
Platelets: 161 K/uL (ref 150–400)
RBC: 2.51 MIL/uL — ABNORMAL LOW (ref 4.22–5.81)
RDW: 17.8 % — ABNORMAL HIGH (ref 11.5–15.5)
WBC: 18.9 K/uL — ABNORMAL HIGH (ref 4.0–10.5)
nRBC: 0.2 % (ref 0.0–0.2)

## 2024-10-13 LAB — CBC WITH DIFFERENTIAL/PLATELET
Abs Immature Granulocytes: 0.28 K/uL — ABNORMAL HIGH (ref 0.00–0.07)
Basophils Absolute: 0.2 K/uL — ABNORMAL HIGH (ref 0.0–0.1)
Basophils Relative: 1 %
Eosinophils Absolute: 0.2 K/uL (ref 0.0–0.5)
Eosinophils Relative: 1 %
HCT: 16.9 % — ABNORMAL LOW (ref 39.0–52.0)
Hemoglobin: 5.4 g/dL — CL (ref 13.0–17.0)
Immature Granulocytes: 1 %
Lymphocytes Relative: 4 %
Lymphs Abs: 0.7 K/uL (ref 0.7–4.0)
MCH: 30.3 pg (ref 26.0–34.0)
MCHC: 32 g/dL (ref 30.0–36.0)
MCV: 94.9 fL (ref 80.0–100.0)
Monocytes Absolute: 0.7 K/uL (ref 0.1–1.0)
Monocytes Relative: 4 %
Neutro Abs: 18 K/uL — ABNORMAL HIGH (ref 1.7–7.7)
Neutrophils Relative %: 89 %
Platelets: 152 K/uL (ref 150–400)
RBC: 1.78 MIL/uL — ABNORMAL LOW (ref 4.22–5.81)
RDW: 16.5 % — ABNORMAL HIGH (ref 11.5–15.5)
WBC: 20.1 K/uL — ABNORMAL HIGH (ref 4.0–10.5)
nRBC: 0.2 % (ref 0.0–0.2)

## 2024-10-13 LAB — RENAL FUNCTION PANEL
Albumin: <1.5 g/dL — ABNORMAL LOW (ref 3.5–5.0)
Anion gap: 10 (ref 5–15)
BUN: 61 mg/dL — ABNORMAL HIGH (ref 8–23)
CO2: 29 mmol/L (ref 22–32)
Calcium: 8.4 mg/dL — ABNORMAL LOW (ref 8.9–10.3)
Chloride: 99 mmol/L (ref 98–111)
Creatinine, Ser: 3.16 mg/dL — ABNORMAL HIGH (ref 0.61–1.24)
GFR, Estimated: 20 mL/min — ABNORMAL LOW (ref >60)
Glucose, Bld: 121 mg/dL — ABNORMAL HIGH (ref 70–99)
Phosphorus: 1.9 mg/dL — ABNORMAL LOW (ref 2.5–4.6)
Potassium: 4.1 mmol/L (ref 3.5–5.1)
Sodium: 138 mmol/L (ref 135–145)

## 2024-10-13 LAB — PREPARE RBC (CROSSMATCH)

## 2024-10-13 LAB — HEMOGLOBIN AND HEMATOCRIT, BLOOD
HCT: 19.7 % — ABNORMAL LOW (ref 39.0–52.0)
HCT: 23.4 % — ABNORMAL LOW (ref 39.0–52.0)
Hemoglobin: 6.5 g/dL — CL (ref 13.0–17.0)
Hemoglobin: 7.6 g/dL — ABNORMAL LOW (ref 13.0–17.0)

## 2024-10-14 LAB — BPAM RBC
Blood Product Expiration Date: 202510312359
Blood Product Expiration Date: 202511022359
Blood Product Expiration Date: 202510282359
ISSUE DATE / TIME: 202510270439
ISSUE DATE / TIME: 202510271354
ISSUE DATE / TIME: 202510241003
Unit Type and Rh: 9500
Unit Type and Rh: 9500
Unit Type and Rh: 9500

## 2024-10-14 LAB — TYPE AND SCREEN
ABO/RH(D): O NEG
Antibody Screen: NEGATIVE
Unit division: 0
Unit division: 0

## 2024-10-14 LAB — CBC
HCT: 20.1 % — ABNORMAL LOW (ref 39.0–52.0)
Hemoglobin: 6.5 g/dL — CL (ref 13.0–17.0)
MCH: 29.7 pg (ref 26.0–34.0)
MCHC: 32.3 g/dL (ref 30.0–36.0)
MCV: 91.8 fL (ref 80.0–100.0)
Platelets: 153 K/uL (ref 150–400)
RBC: 2.19 MIL/uL — ABNORMAL LOW (ref 4.22–5.81)
RDW: 19.7 % — ABNORMAL HIGH (ref 11.5–15.5)
WBC: 19.6 K/uL — ABNORMAL HIGH (ref 4.0–10.5)
nRBC: 0.1 % (ref 0.0–0.2)

## 2024-10-14 LAB — PREPARE RBC (CROSSMATCH)

## 2024-10-15 LAB — CBC WITH DIFFERENTIAL/PLATELET
Abs Immature Granulocytes: 0.22 K/uL — ABNORMAL HIGH (ref 0.00–0.07)
Basophils Absolute: 0.3 K/uL — ABNORMAL HIGH (ref 0.0–0.1)
Basophils Relative: 2 %
Eosinophils Absolute: 0.2 K/uL (ref 0.0–0.5)
Eosinophils Relative: 1 %
HCT: 23.6 % — ABNORMAL LOW (ref 39.0–52.0)
Hemoglobin: 7.6 g/dL — ABNORMAL LOW (ref 13.0–17.0)
Immature Granulocytes: 1 %
Lymphocytes Relative: 4 %
Lymphs Abs: 0.7 K/uL (ref 0.7–4.0)
MCH: 28.7 pg (ref 26.0–34.0)
MCHC: 32.2 g/dL (ref 30.0–36.0)
MCV: 89.1 fL (ref 80.0–100.0)
Monocytes Absolute: 0.9 K/uL (ref 0.1–1.0)
Monocytes Relative: 5 %
Neutro Abs: 16.5 K/uL — ABNORMAL HIGH (ref 1.7–7.7)
Neutrophils Relative %: 87 %
Platelets: 146 K/uL — ABNORMAL LOW (ref 150–400)
RBC: 2.65 MIL/uL — ABNORMAL LOW (ref 4.22–5.81)
RDW: 19.5 % — ABNORMAL HIGH (ref 11.5–15.5)
WBC: 18.9 K/uL — ABNORMAL HIGH (ref 4.0–10.5)
nRBC: 0.2 % (ref 0.0–0.2)

## 2024-10-15 LAB — RENAL FUNCTION PANEL
Albumin: <1.5 g/dL — ABNORMAL LOW (ref 3.5–5.0)
Anion gap: 13 (ref 5–15)
BUN: 49 mg/dL — ABNORMAL HIGH (ref 8–23)
CO2: 25 mmol/L (ref 22–32)
Calcium: 8.2 mg/dL — ABNORMAL LOW (ref 8.9–10.3)
Chloride: 93 mmol/L — ABNORMAL LOW (ref 98–111)
Creatinine, Ser: 2.68 mg/dL — ABNORMAL HIGH (ref 0.61–1.24)
GFR, Estimated: 25 mL/min — ABNORMAL LOW (ref >60)
Glucose, Bld: 113 mg/dL — ABNORMAL HIGH (ref 70–99)
Phosphorus: 3.8 mg/dL (ref 2.5–4.6)
Potassium: 4.1 mmol/L (ref 3.5–5.1)
Sodium: 131 mmol/L — ABNORMAL LOW (ref 135–145)

## 2024-10-15 LAB — TYPE AND SCREEN
ABO/RH(D): O NEG
Antibody Screen: NEGATIVE
Unit division: 0

## 2024-10-15 LAB — BPAM RBC
Blood Product Expiration Date: 202511042359
ISSUE DATE / TIME: 202510282032
Unit Type and Rh: 9500

## 2024-10-17 LAB — RENAL FUNCTION PANEL
Albumin: <1.5 g/dL — ABNORMAL LOW (ref 3.5–5.0)
Anion gap: 14 (ref 5–15)
BUN: 47 mg/dL — ABNORMAL HIGH (ref 8–23)
CO2: 25 mmol/L (ref 22–32)
Calcium: 8.2 mg/dL — ABNORMAL LOW (ref 8.9–10.3)
Chloride: 97 mmol/L — ABNORMAL LOW (ref 98–111)
Creatinine, Ser: 2.61 mg/dL — ABNORMAL HIGH (ref 0.61–1.24)
GFR, Estimated: 26 mL/min — ABNORMAL LOW (ref >60)
Glucose, Bld: 115 mg/dL — ABNORMAL HIGH (ref 70–99)
Phosphorus: 2.5 mg/dL (ref 2.5–4.6)
Potassium: 4.3 mmol/L (ref 3.5–5.1)
Sodium: 136 mmol/L (ref 135–145)

## 2024-10-17 LAB — CBC WITH DIFFERENTIAL/PLATELET
Abs Immature Granulocytes: 0.15 K/uL — ABNORMAL HIGH (ref 0.00–0.07)
Basophils Absolute: 0.2 K/uL — ABNORMAL HIGH (ref 0.0–0.1)
Basophils Relative: 2 %
Eosinophils Absolute: 0.3 K/uL (ref 0.0–0.5)
Eosinophils Relative: 2 %
HCT: 21.4 % — ABNORMAL LOW (ref 39.0–52.0)
Hemoglobin: 7 g/dL — ABNORMAL LOW (ref 13.0–17.0)
Immature Granulocytes: 1 %
Lymphocytes Relative: 4 %
Lymphs Abs: 0.6 K/uL — ABNORMAL LOW (ref 0.7–4.0)
MCH: 29.8 pg (ref 26.0–34.0)
MCHC: 32.7 g/dL (ref 30.0–36.0)
MCV: 91.1 fL (ref 80.0–100.0)
Monocytes Absolute: 0.7 K/uL (ref 0.1–1.0)
Monocytes Relative: 5 %
Neutro Abs: 11.9 K/uL — ABNORMAL HIGH (ref 1.7–7.7)
Neutrophils Relative %: 86 %
Platelets: 156 K/uL (ref 150–400)
RBC: 2.35 MIL/uL — ABNORMAL LOW (ref 4.22–5.81)
RDW: 18.9 % — ABNORMAL HIGH (ref 11.5–15.5)
WBC: 13.8 K/uL — ABNORMAL HIGH (ref 4.0–10.5)
nRBC: 0 % (ref 0.0–0.2)

## 2024-10-18 LAB — HEMOGLOBIN AND HEMATOCRIT, BLOOD
HCT: 21.1 % — ABNORMAL LOW (ref 39.0–52.0)
Hemoglobin: 6.4 g/dL — CL (ref 13.0–17.0)

## 2024-10-18 LAB — PREPARE RBC (CROSSMATCH)

## 2024-10-19 LAB — TYPE AND SCREEN
ABO/RH(D): O NEG
Antibody Screen: NEGATIVE
Unit division: 0

## 2024-10-19 LAB — BPAM RBC
Blood Product Expiration Date: 202511072359
ISSUE DATE / TIME: 202511011639
Unit Type and Rh: 9500

## 2024-10-19 LAB — HEMOGLOBIN AND HEMATOCRIT, BLOOD
HCT: 22.6 % — ABNORMAL LOW (ref 39.0–52.0)
Hemoglobin: 7.3 g/dL — ABNORMAL LOW (ref 13.0–17.0)

## 2024-10-20 ENCOUNTER — Institutional Professional Consult (permissible substitution) (HOSPITAL_COMMUNITY)

## 2024-10-20 LAB — RENAL FUNCTION PANEL
Albumin: <1.5 g/dL — ABNORMAL LOW (ref 3.5–5.0)
Anion gap: 12 (ref 5–15)
BUN: 73 mg/dL — ABNORMAL HIGH (ref 8–23)
CO2: 23 mmol/L (ref 22–32)
Calcium: 8.2 mg/dL — ABNORMAL LOW (ref 8.9–10.3)
Chloride: 96 mmol/L — ABNORMAL LOW (ref 98–111)
Creatinine, Ser: 3.26 mg/dL — ABNORMAL HIGH (ref 0.61–1.24)
GFR, Estimated: 20 mL/min — ABNORMAL LOW (ref >60)
Glucose, Bld: 107 mg/dL — ABNORMAL HIGH (ref 70–99)
Phosphorus: 2.6 mg/dL (ref 2.5–4.6)
Potassium: 4.9 mmol/L (ref 3.5–5.1)
Sodium: 131 mmol/L — ABNORMAL LOW (ref 135–145)

## 2024-10-20 LAB — URINALYSIS, ROUTINE W REFLEX MICROSCOPIC
Bilirubin Urine: NEGATIVE
Glucose, UA: NEGATIVE mg/dL
Hgb urine dipstick: NEGATIVE
Ketones, ur: NEGATIVE mg/dL
Nitrite: NEGATIVE
Protein, ur: 100 mg/dL — AB
Specific Gravity, Urine: 1.015 (ref 1.005–1.030)
pH: 5 (ref 5.0–8.0)

## 2024-10-20 LAB — CBC WITH DIFFERENTIAL/PLATELET
Band Neutrophils: 15 %
Basophils Absolute: 0.5 K/uL — ABNORMAL HIGH (ref 0.0–0.1)
Basophils Relative: 3 %
Eosinophils Absolute: 0.2 K/uL (ref 0.0–0.5)
Eosinophils Relative: 1 %
HCT: 24.7 % — ABNORMAL LOW (ref 39.0–52.0)
Hemoglobin: 7.8 g/dL — ABNORMAL LOW (ref 13.0–17.0)
Lymphocytes Relative: 1 %
Lymphs Abs: 0.2 K/uL — ABNORMAL LOW (ref 0.7–4.0)
MCH: 29.5 pg (ref 26.0–34.0)
MCHC: 31.6 g/dL (ref 30.0–36.0)
MCV: 93.6 fL (ref 80.0–100.0)
Monocytes Absolute: 0.3 K/uL (ref 0.1–1.0)
Monocytes Relative: 2 %
Neutro Abs: 14 K/uL — ABNORMAL HIGH (ref 1.7–7.7)
Neutrophils Relative %: 78 %
Platelets: 139 K/uL — ABNORMAL LOW (ref 150–400)
RBC: 2.64 MIL/uL — ABNORMAL LOW (ref 4.22–5.81)
RDW: 17.8 % — ABNORMAL HIGH (ref 11.5–15.5)
Smear Review: NORMAL
WBC: 15.1 K/uL — ABNORMAL HIGH (ref 4.0–10.5)
nRBC: 0 % (ref 0.0–0.2)

## 2024-10-20 LAB — FERRITIN: Ferritin: 4546 ng/mL — ABNORMAL HIGH (ref 24–336)

## 2024-10-20 LAB — IRON AND TIBC
Iron: 23 ug/dL — ABNORMAL LOW (ref 45–182)
Saturation Ratios: 21 % (ref 17.9–39.5)
TIBC: 112 ug/dL — ABNORMAL LOW (ref 250–450)
UIBC: 89 ug/dL

## 2024-10-22 LAB — CBC WITH DIFFERENTIAL/PLATELET
Abs Immature Granulocytes: 0.21 K/uL — ABNORMAL HIGH (ref 0.00–0.07)
Basophils Absolute: 0.2 K/uL — ABNORMAL HIGH (ref 0.0–0.1)
Basophils Relative: 2 %
Eosinophils Absolute: 0.3 K/uL (ref 0.0–0.5)
Eosinophils Relative: 2 %
HCT: 21.4 % — ABNORMAL LOW (ref 39.0–52.0)
Hemoglobin: 6.8 g/dL — CL (ref 13.0–17.0)
Immature Granulocytes: 2 %
Lymphocytes Relative: 5 %
Lymphs Abs: 0.5 K/uL — ABNORMAL LOW (ref 0.7–4.0)
MCH: 30.2 pg (ref 26.0–34.0)
MCHC: 31.8 g/dL (ref 30.0–36.0)
MCV: 95.1 fL (ref 80.0–100.0)
Monocytes Absolute: 0.6 K/uL (ref 0.1–1.0)
Monocytes Relative: 6 %
Neutro Abs: 8.8 K/uL — ABNORMAL HIGH (ref 1.7–7.7)
Neutrophils Relative %: 83 %
Platelets: 159 K/uL (ref 150–400)
RBC: 2.25 MIL/uL — ABNORMAL LOW (ref 4.22–5.81)
RDW: 18.3 % — ABNORMAL HIGH (ref 11.5–15.5)
WBC: 10.6 K/uL — ABNORMAL HIGH (ref 4.0–10.5)
nRBC: 0.2 % (ref 0.0–0.2)

## 2024-10-22 LAB — RENAL FUNCTION PANEL
Albumin: <1.5 g/dL — ABNORMAL LOW (ref 3.5–5.0)
Anion gap: 12 (ref 5–15)
BUN: 74 mg/dL — ABNORMAL HIGH (ref 8–23)
CO2: 24 mmol/L (ref 22–32)
Calcium: 8.4 mg/dL — ABNORMAL LOW (ref 8.9–10.3)
Chloride: 97 mmol/L — ABNORMAL LOW (ref 98–111)
Creatinine, Ser: 2.86 mg/dL — ABNORMAL HIGH (ref 0.61–1.24)
GFR, Estimated: 23 mL/min — ABNORMAL LOW (ref >60)
Glucose, Bld: 109 mg/dL — ABNORMAL HIGH (ref 70–99)
Phosphorus: 2.3 mg/dL — ABNORMAL LOW (ref 2.5–4.6)
Potassium: 4 mmol/L (ref 3.5–5.1)
Sodium: 133 mmol/L — ABNORMAL LOW (ref 135–145)

## 2024-10-22 LAB — MAGNESIUM: Magnesium: 2.3 mg/dL (ref 1.7–2.4)

## 2024-10-22 LAB — PREPARE RBC (CROSSMATCH)

## 2024-10-22 NOTE — Progress Notes (Signed)
 [facility]  ROUNDING NOTE   Subjective:   Patient unstable at the moment.   Heart rate in the 150s.   Currently on Cardizem drip.   Underwent dialysis treatment today.   UF achieved was 1.5 kg.   Objective:  Vital signs in last 24 hours:  Temp:  [97.2 F (36.2 C)-99.7 F (37.6 C)] 97.4 F (36.3 C) Pulse:  [104-152] 135 Resp:  [16-24] 18 BP: (96-145)/(51-100) 110/61 FiO2 (%):  [24 %] 24 %    Intake/Output: I/O last 3 completed shifts: In: 1590 [P.O.:420; NG/GT:1170] Out: 0    Intake/Output this shift:  I/O this shift: In: 581 [NG/GT:581] Out: 1700   Physical Exam: General: No acute distress  Head: Normocephalic, atraumatic. Moist oral mucosal membranes  Neck: Supple  Lungs:  Scattered rhonchi, normal effort  Heart: S1S2 no rubs  Abdomen:  Soft, nontender, bowel sounds present  Extremities: No peripheral edema.  Neurologic: Awake, alert, following commands  Skin: No acute rash  Access: Right IJ temporary dialysis catheter    Basic Metabolic Panel: Recent Labs  Lab Units 10/20/24 0000  SERUM CREATININE  3.26    Imaging: @[id number] @   Medications:   dilTIAZem 100 mg in sodium chloride  0.9% 100 mL (1 mg/mL) infusion ADD-Vantage, 5 mg/hr, Last Rate: 5 mg/hr (10/22/24 1605)   Scheduled/Continuous Medications     Continuous         dilTIAZem 100 mg in sodium chloride  0.9% 100 mL (1 mg/mL) infusion ADD-Vantage  5 mg/hr, CI, Titrated           Scheduled         allopurinol  (ZYLOPRIM ) tablet 50 mg  50 mg, PO/Per Tube, Every Other Day     amiodarone (PACERONE) tablet 200 mg  200 mg, Tube, Once a day     [Held by provider] aspirin  chewable tablet 81 mg On hold since Mon 10/13/2024 at 1049 until manually unheld; held by Derryl JONELLE Pottier, MDHold Reason: Contraindicated  81 mg, Tube, Once a day     cefTRIAXone  (ROCEPHIN ) 1 g in sterile water  IV syringe  1 g, IV, Q24H     guaiFENesin tablet 400 mg  400 mg, PO/Per Tube, Q6H SCH      heparin  (porcine) injection 3,200 Units  3,200 Units, IK, MWF w/ Dialysis     [Held by provider] heparin  (porcine) injection 5,000 Units On hold since Mon 10/13/2024 at 1049 until manually unheld; held by Derryl JONELLE Pottier, MDHold Reason: Contraindicated  5,000 Units, [place], Q12H SCH     insulin lispro injection 0-12 Units (And Linked Group #1)  0-12 Units, [place], Q6H SCH     latanoprost (XALATAN) 0.005 % ophthalmic solution 1 drop  1 drop, Both Eyes, Nightly     levothyroxine (SYNTHROID) tablet 25 mcg  25 mcg, PO/Per Tube, Daily at 0600     melatonin tablet 3 mg  3 mg, PO/Per Tube, Nightly     methoxy peg-epoetin beta (MICERA) injection 100 mcg  100 mcg, IV, Q14 Days     mirtazapine  (REMERON ) tablet 7.5 mg  7.5 mg, PO, Nightly     nystatin (MYCOSTATIN) 100000 UNIT/ML suspension 500,000 Units  500,000 Units, MT, 4x Daily     Omeprazole ODT (Prilosec OTC) disintegrating tablet 40 mg  40 mg, PO/Per Tube, Once a day     risperiDONE (RisperDAL) tablet 0.5 mg  0.5 mg, PO/Per Tube, Nightly     sertraline  (ZOLOFT ) tablet 100 mg  100 mg, Tube, Once a day  traZODone  (DESYREL ) tablet 50 mg  50 mg, PO/Per Tube, Nightly     vitamin B-12 (CYANOCOBALAMIN ) tablet 1,000 mcg  1,000 mcg, PO, Once a day             .  glucose **OR** juice or non-diet carbonated beverage .  acetaminophen  .  dextrose **OR** dextrose .  diazePAM .  Erythropoiesis-Stimulating Agent (ESA) per policy .  glucagon .  metoprolol tartrate (LORPRESSOR) injection .  naloxone (NARCAN) injection .  ondansetron  ODT .  oxyCODONE  .  sodium chloride  .  sodium chloride  infusion  Assessment/ Plan:  73 y.o. male with a PMHx of prostate cancer, COPD with emphysema, chronic hypoxemic respiratory failure, atrial fibrillation, severe protein calorie malnutrition, myelofibrosis, GERD, anxiety, gout, ESBL bacteremia, ileus, acute kidney injury secondary to septic ATN, acute metabolic encephalopathy, myelofibrosis  with anemia, hypothyroidism, paraphimosis who was admitted to [facility] on 10/01/2024 for ongoing care.      1.  Acute kidney injury likely secondary to septic/ischemic ATN with baseline creatinine of 1.1.  Patient completed hemodialysis treatment today though it was difficult given his ongoing tachycardia.  UF achieved was 1.5 kg.  Next dialysis treatment scheduled for Friday.   2. Anemia of chronic kidney disease.  Patient with known history of myelofibrosis.  Hemoglobin dropped again to 6.8 today.  He was given blood transfusion.   3. Secondary hyperparathyroidism.  Phosphorus was 2.3 today and acceptable.  Continue to monitor bone mineral metabolism parameters.   LOS: 21 LATEEF, BONNELL SAILOR, MD 11/5/20254:37 PM EST

## 2024-10-22 NOTE — Unmapped External Note (Signed)
 Held treatment today with hemoglobin at 6.8 today.

## 2024-10-22 NOTE — Nursing Note (Addendum)
 Patient rhythm has been ST most of the day. MD and charge aware. Received various PRN medications that were effective for short periods of time.  Patient is currently on cardizem 15mg /hr. Patient alert and responding appropriately.

## 2024-10-23 ENCOUNTER — Institutional Professional Consult (permissible substitution) (HOSPITAL_COMMUNITY)

## 2024-10-23 LAB — TYPE AND SCREEN
ABO/RH(D): O NEG
Antibody Screen: NEGATIVE
Unit division: 0

## 2024-10-23 LAB — BPAM RBC
Blood Product Expiration Date: 202511172359
ISSUE DATE / TIME: 202511051219
Unit Type and Rh: 9500

## 2024-10-23 LAB — HEMOGLOBIN AND HEMATOCRIT, BLOOD
HCT: 23.8 % — ABNORMAL LOW (ref 39.0–52.0)
Hemoglobin: 7.5 g/dL — ABNORMAL LOW (ref 13.0–17.0)

## 2024-10-23 NOTE — Unmapped External Note (Signed)
 Case Management Reassessment Note  CM Reassessment Note:   Contact with:  Adult Child  Name(s):  [name] Elting  Discharge Plan Status:  Tentative  Patient Discharge Goal:  SNF/SNU  Back-Up Discharge Goal:  SNF/SNU  Acceptance of Discharge Plan:  Patient verbalizes acceptance and Family/Caregiver verbalizes acceptance  Team Conference Review:  Reviewed results with patient/family, Shared responsibility to participate in the discharge planning process and Estimated DC Date relayed  Clinical Assessment and Discharge Plan of Care Discussed:  Dialysis  Benefits of and limitations of services discussed:  [email or web address] Provider quality of care and resource use measures list(s) provided    Expected Discharge Date: 11/01/2024   Narrative  CM spoke to the patient daughter, [name], at bedside for discharge planning updates. The discharge plan is SNF. Referrals are out. The patient is tolerating dialysis 3x a week. The patient is currently on 0.8 L O2 via nasal cannula.  He remains alert and oriented x2.  He is tolerating PEG tube feeds well. He is participating with therapy. He had a blood transfusion  yesterday for hemoglobin of 6.8. CM will continue to monitor for discharge planning needs.   FRAZIER, LAKETHA, LPN [id number]

## 2024-10-23 NOTE — Progress Notes (Signed)
 PATIENT INFORMATION   Patient Name: Ricardo [name]  Date of Birth: 07-05-1951  MRN: [id number]   Date of Admission: 10/01/2024  5:53 PM Length of Stay: 22 Length of Stay: 22   Primary Care Physician: No primary care provider on file.  Attending Physician:SHARMA, DERRYL SAUNDERS, MD    SUBJECTIVE   Chief Complaint:  Status post septic shock and acute respiratory failure.  Interval Summary:  Ricardo [name] is a 73 y.o. male that has been admitted to [facility].  The patient was admitted to [facility] from 08/03/2024 until 10/01/2024.  The patient was initially admitted to [facility] for UTI, septic shock, acute respiratory failure an absolute neutropenia.  He was subsequently transferred to [facility] medical center for anuric  renal failure in the setting of acute hypoxemic respiratory failure.  The patient underwent CRRT from 08/06/2024 and was transitioned to intermittent hemodialysis.  The patient's course at the outside facility was complicated by requiring high O2.  Patient was on BiPAP, high-flow nasal cannula.  The patient was intubated but self extubated on 09/01/2024.  The patient was intubated twice while at the outside facility.  The patient has stated he did not want tracheostomy placed so he changed his status to DNR/DNI.  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  While at the outside facility patient was diagnosed with ESBL bacteremia and Enterococcus faecalis peritonitis.  He was started on ertapenem, for a total of 2 weeks to end on 10/05/2024.  09/28/2024 patient underwent paracentesis with removal of 1.8 L. thirty fluid was positive for SPB ,cultures were positive for Enterococcus faecalis and patient was discharged on IV ampicillin and Rocephin  to end on 10/07/2021  Subjective:   Patient has been started on Cardizem drip due to tachycardia yesterday. Now HR is much sinus rhythm better in the 80s. Plan  is for [facility] cath placement today by IR. His daughter was at bedside and answered all the questions. He is tolerating PEG tube feeds well.    OBJECTIVE   Objective  Vital Signs:  BP 126/59   Pulse 90   Temp 98.2 F (36.8 C) (Axillary)   Resp 18   Ht 6' 1 (1.854 m)   Wt 141 lb (64 kg)   SpO2 94%   BMI 18.60 kg/m    I/O last 24 Hours:  Intake/Output Summary (Last 24 hours) at 10/23/2024 1405 Last data filed at 10/23/2024 0800 Gross per 24 hour  Intake 697.17 ml  Output 200 ml  Net 497.17 ml     Physical Exam:  Elderly gentleman lying in bed in NAD HEENT:  Normocephalic atraumatic, pupils equal round and reactive, EOMs intact, nares  patent, oropharynx bloody  Neck : tracheostomy  midline, no secretions  Cardiovascular : S1-S2 no murmurs rubs or gallops Lungs: clear to auscultation to the base Abdomen: positive bowel sounds soft peg intact Extremities: no clubbing cyanosis or edema  Neuro:  awake and alert.   Labs:   Hb is 7.5 (was 6.8 yesterday)  Xrays:  CXR 10/20/2024:  Mild interstitial edema with bilateral pleural effusions and bibasilar atelectasis.     ASSESSMENT AND PLAN   Ricardo [name] is a 73 y.o. male  who was admitted on 10/01/2024 with Acute kidney injury due to sepsis [N17.9, A41.9] .    Assessment/Plan:  ESBL bacteremia.  The patient has completed a course of ertapenem for his bacteremia.  We did repeat his blood cultures  on 10/04/2024 and these have shown no growth.   Enterococcus faecalis peritonitis.  The patient completed a course of IV ampicillin and Rocephin  for his peritonitis on 10/14/2024.   We will continue to monitor his white blood cell count and fever curve.  Acute kidney injury currently on hemodialysis.  He went into acute renal failure due to septic shock with ATN.  Patient continues on hemodialysis nephrology continues to follow with dialysis on M/W/F.  Plan is for PerCath placement today by IR.    Acute metabolic encephalopathy.   He developed encephalopathy with the acute delirium at the outside hospital.  He is currently on sertraline  and trazodone  at this time.  I had a discussion with his daughter at bedside and he was on home trazodone , melatonin, mirtazapine , and Risperdal.  He is more alert today.   UTI.  His UA showed a probable UTI where we will send out a urine culture and he was encephalopathic. He was started on Ceftriaxone  daily on 11/4.    Myelofibrosis/anemia.  He has some anemia andthrombocytopenia but at baseline, he does not have leukocytosis.  We will transfuse him as needed.  They have started him on Micera for his anemia of chronic kidney disease.  He had a negative stool guaiac on 10/05/2024.  Patient was transfused 10/06/2024 .  Hb was 5.4 pm 10/27; 2 units PRBC transfusion.  He was transfused on 10/14/2024 and again on 10/18/2024.  We will continue to monitor H&H.  His iron studies show a low iron level.  He was transfused again on 10/22/2024.  Hypothyroidism.  He remains on levothyroxine at this time.  Paraphimosis.  He was seen by Urology in 08/2024.  A Foley was placed and was then removed on 09/25/2024.  We will continue to monitor this area and monitor his urine output.  Atrial fibrillation/sinus tachycardia.  He seems to be rate controlled at this time.  He is on amiodarone as well as metoprolol.  His Aspirin  held due to severe anemia.  The patient has had some sinus tachycardia where we increased his metoprolol to 25 mg t.i.d..  We will continue to monitor with telemetry.  His sinus tachycardia has been intermittent and we will continue his current dosing.  We increased his amiodarone from 100 mg to 200 mg daily but he still has some sinus tachycardia.  Cardiology was consulted and recommends starting him on diltiazem.  We will continue on metoprolol as well.  Wounds.  The patient has an unstageable sacral pressure ulcer as well as a deep tissue injury to his left ear and his nose.  Wound Care is  following him.  He underwent a selective bedside debridement of his sacral wound  Severe protein caloric malnutrition/dysphagia.  He is status post PEG tube placement.  We will continue PEG tube feeds at this time.  Speech therapy in the registered dietitian is following along.  Debility.  PT and OT therapies are currently following.  Prevention.  He is currently on heparin  for DVT prophylaxis  (held due to severe anemia) and omeprazole for GI prophylaxis.  Dispo.  He was discussed in our multidisciplinary team meeting on 10/21/2024 and he will need skilled nursing facility placement at discharge.    Code Status:  Limited Resuscitation    Spent a total of 50  minutes on this encounter. This includes reviewing patient's extensive history, assessment and visit with the patient as well as documentation time. Time spent also includes IDT collaboration.    SIGNATURE  Electronically signed:  FRUTOSO DERRYL SAUNDERS, MD  10/23/2024 at 2:05 PM EST

## 2024-10-24 ENCOUNTER — Institutional Professional Consult (permissible substitution) (HOSPITAL_COMMUNITY)

## 2024-10-24 LAB — RENAL FUNCTION PANEL
Albumin: <1.5 g/dL — ABNORMAL LOW (ref 3.5–5.0)
Anion gap: 11 (ref 5–15)
BUN: 62 mg/dL — ABNORMAL HIGH (ref 8–23)
CO2: 25 mmol/L (ref 22–32)
Calcium: 8.2 mg/dL — ABNORMAL LOW (ref 8.9–10.3)
Chloride: 97 mmol/L — ABNORMAL LOW (ref 98–111)
Creatinine, Ser: 2.65 mg/dL — ABNORMAL HIGH (ref 0.61–1.24)
GFR, Estimated: 25 mL/min — ABNORMAL LOW (ref >60)
Glucose, Bld: 116 mg/dL — ABNORMAL HIGH (ref 70–99)
Phosphorus: 2 mg/dL — ABNORMAL LOW (ref 2.5–4.6)
Potassium: 4.1 mmol/L (ref 3.5–5.1)
Sodium: 133 mmol/L — ABNORMAL LOW (ref 135–145)

## 2024-10-24 LAB — CBC WITH DIFFERENTIAL/PLATELET
Abs Immature Granulocytes: 0.21 K/uL — ABNORMAL HIGH (ref 0.00–0.07)
Basophils Absolute: 0.3 K/uL — ABNORMAL HIGH (ref 0.0–0.1)
Basophils Relative: 2 %
Eosinophils Absolute: 0.3 K/uL (ref 0.0–0.5)
Eosinophils Relative: 2 %
HCT: 23.6 % — ABNORMAL LOW (ref 39.0–52.0)
Hemoglobin: 7.5 g/dL — ABNORMAL LOW (ref 13.0–17.0)
Immature Granulocytes: 2 %
Lymphocytes Relative: 5 %
Lymphs Abs: 0.7 K/uL (ref 0.7–4.0)
MCH: 29.8 pg (ref 26.0–34.0)
MCHC: 31.8 g/dL (ref 30.0–36.0)
MCV: 93.7 fL (ref 80.0–100.0)
Monocytes Absolute: 1.1 K/uL — ABNORMAL HIGH (ref 0.1–1.0)
Monocytes Relative: 7 %
Neutro Abs: 11.8 K/uL — ABNORMAL HIGH (ref 1.7–7.7)
Neutrophils Relative %: 82 %
Platelets: 199 K/uL (ref 150–400)
RBC: 2.52 MIL/uL — ABNORMAL LOW (ref 4.22–5.81)
RDW: 18 % — ABNORMAL HIGH (ref 11.5–15.5)
WBC: 14.3 K/uL — ABNORMAL HIGH (ref 4.0–10.5)
nRBC: 0.2 % (ref 0.0–0.2)

## 2024-10-24 LAB — URINE CULTURE: Culture: >=100000 — AB

## 2024-10-24 LAB — HEPATITIS B SURFACE ANTIGEN: Hepatitis B Surface Ag: NONREACTIVE

## 2024-10-24 NOTE — Consult Note (Signed)
   Patient Status: [facility] - In-pt  Assessment and Plan: Patient in need of venous access.  WBC 14.3 today from 10.6 2 days ago. Given current tx with abx for UTI, will consider this acceptable to proceed. No pending BC. VSS. Currently on dilt gtt but there are plans to transition to oral this AM. Currently NPO, nothing per tube.    ______________________________________________________________________   History of Present Illness: Ricardo [name] is a 73 y.o. male admitted to [facility] for continued acuity of care following hospitalization for septic shocl with acute respiratory failure. He is DNR/DNI currently having decline trach, has accepted bipap as needed. The patient underwent CRRT from 08/06/2024 and was transitioned to intermittent hemodialysis. He currently is receiving HD via temp cath placed by outside service in Oct.   Hospitalization has been complicated by peritonitis in Oct and more recently UTI for which he is currently on abx.   Allergies and medications reviewed.   Review of Systems: A 12 point ROS discussed and pertinent positives are indicated in the HPI above.  All other systems are negative.  Review of Systems  Unable to perform ROS: Mental status change    Vital Signs: 98.25F, 102bpm, 22RR, 142/78, 100%  Physical Exam Constitutional:      Comments: Awakens to voice  HENT:     Mouth/Throat:     Mouth: Mucous membranes are dry.     Pharynx: Oropharynx is clear.  Cardiovascular:     Rate and Rhythm: Regular rhythm. Tachycardia present.  Pulmonary:     Breath sounds: Normal breath sounds.     Comments: Increased work of breathing, Wearing [place] 1 L Abdominal:     General: There is no distension.     Palpations: Abdomen is soft.     Tenderness: There is no abdominal tenderness.     Comments: G tube present  Musculoskeletal:     Right lower leg: No edema.     Left lower leg: No edema.  Skin:    General: Skin is warm and dry.     Comments: Well  dressed R internal jugular temp cath present  Neurological:     Mental Status: He is disoriented.  Psychiatric:        Mood and Affect: Mood normal.      Imaging reviewed.   Labs:  COAGS: Recent Labs    07/03/24 1253 07/04/24 0401  INR 1.3*  --   APTT  --  41*    BMP: Recent Labs    10/17/24 0350 10/20/24 0424 10/22/24 0433 10/24/24 0337  NA 136 131* 133* 133*  K 4.3 4.9 4.0 4.1  CL 97* 96* 97* 97*  CO2 25 23 24 25   GLUCOSE 115* 107* 109* 116*  BUN 47* 73* 74* 62*  CALCIUM  8.2* 8.2* 8.4* 8.2*  CREATININE 2.61* 3.26* 2.86* 2.65*  GFRNONAA 26* 20* 23* 25*       Electronically Signed: Kailey Esquilin, NP 10/24/2024, 8:42 AM   I spent a total of 15 minutes in face to face in clinical consultation, greater than 50% of which was counseling/coordinating care for venous access.

## 2024-10-25 LAB — CBC WITH DIFFERENTIAL/PLATELET
Abs Immature Granulocytes: 0.52 10*3/uL — ABNORMAL HIGH (ref 0.00–0.07)
Basophils Absolute: 0.3 10*3/uL — ABNORMAL HIGH (ref 0.0–0.1)
Basophils Relative: 2 %
Eosinophils Absolute: 0.4 10*3/uL (ref 0.0–0.5)
Eosinophils Relative: 2 %
HCT: 26.3 % — ABNORMAL LOW (ref 39.0–52.0)
Hemoglobin: 8.4 g/dL — ABNORMAL LOW (ref 13.0–17.0)
Immature Granulocytes: 3 %
Lymphocytes Relative: 5 %
Lymphs Abs: 0.8 10*3/uL (ref 0.7–4.0)
MCH: 29.8 pg (ref 26.0–34.0)
MCHC: 31.9 g/dL (ref 30.0–36.0)
MCV: 93.3 fL (ref 80.0–100.0)
Monocytes Absolute: 0.7 10*3/uL (ref 0.1–1.0)
Monocytes Relative: 4 %
Neutro Abs: 13.8 10*3/uL — ABNORMAL HIGH (ref 1.7–7.7)
Neutrophils Relative %: 84 %
Platelets: 244 10*3/uL (ref 150–400)
RBC: 2.82 MIL/uL — ABNORMAL LOW (ref 4.22–5.81)
RDW: 18.3 % — ABNORMAL HIGH (ref 11.5–15.5)
WBC: 16.4 10*3/uL — ABNORMAL HIGH (ref 4.0–10.5)
nRBC: 0.2 % (ref 0.0–0.2)

## 2024-10-25 LAB — RENAL FUNCTION PANEL
Albumin: <1.5 g/dL — ABNORMAL LOW (ref 3.5–5.0)
Anion gap: 8 (ref 5–15)
BUN: 78 mg/dL — ABNORMAL HIGH (ref 8–23)
CO2: 26 mmol/L (ref 22–32)
Calcium: 8.6 mg/dL — ABNORMAL LOW (ref 8.9–10.3)
Chloride: 96 mmol/L — ABNORMAL LOW (ref 98–111)
Creatinine, Ser: 3.34 mg/dL — ABNORMAL HIGH (ref 0.61–1.24)
GFR, Estimated: 19 mL/min — ABNORMAL LOW (ref >60)
Glucose, Bld: 113 mg/dL — ABNORMAL HIGH (ref 70–99)
Phosphorus: 2.2 mg/dL — ABNORMAL LOW (ref 2.5–4.6)
Potassium: 4.4 mmol/L (ref 3.5–5.1)
Sodium: 130 mmol/L — ABNORMAL LOW (ref 135–145)

## 2024-10-27 LAB — CBC WITH DIFFERENTIAL/PLATELET
Abs Immature Granulocytes: 0.5 K/uL — ABNORMAL HIGH (ref 0.00–0.07)
Basophils Absolute: 0.4 K/uL — ABNORMAL HIGH (ref 0.0–0.1)
Basophils Relative: 3 %
Eosinophils Absolute: 0.4 K/uL (ref 0.0–0.5)
Eosinophils Relative: 3 %
HCT: 25.7 % — ABNORMAL LOW (ref 39.0–52.0)
Hemoglobin: 8 g/dL — ABNORMAL LOW (ref 13.0–17.0)
Immature Granulocytes: 4 %
Lymphocytes Relative: 6 %
Lymphs Abs: 0.8 K/uL (ref 0.7–4.0)
MCH: 30.3 pg (ref 26.0–34.0)
MCHC: 31.1 g/dL (ref 30.0–36.0)
MCV: 97.3 fL (ref 80.0–100.0)
Monocytes Absolute: 0.8 K/uL (ref 0.1–1.0)
Monocytes Relative: 6 %
Neutro Abs: 11.1 K/uL — ABNORMAL HIGH (ref 1.7–7.7)
Neutrophils Relative %: 78 %
Platelets: 205 K/uL (ref 150–400)
RBC: 2.64 MIL/uL — ABNORMAL LOW (ref 4.22–5.81)
RDW: 18.1 % — ABNORMAL HIGH (ref 11.5–15.5)
WBC: 14 K/uL — ABNORMAL HIGH (ref 4.0–10.5)
nRBC: 0.2 % (ref 0.0–0.2)

## 2024-10-27 LAB — RENAL FUNCTION PANEL
Albumin: <1.5 g/dL — ABNORMAL LOW (ref 3.5–5.0)
Anion gap: 7 (ref 5–15)
BUN: 55 mg/dL — ABNORMAL HIGH (ref 8–23)
CO2: 27 mmol/L (ref 22–32)
Calcium: 8.4 mg/dL — ABNORMAL LOW (ref 8.9–10.3)
Chloride: 100 mmol/L (ref 98–111)
Creatinine, Ser: 2.57 mg/dL — ABNORMAL HIGH (ref 0.61–1.24)
GFR, Estimated: 26 mL/min — ABNORMAL LOW (ref >60)
Glucose, Bld: 99 mg/dL (ref 70–99)
Phosphorus: 1.7 mg/dL — ABNORMAL LOW (ref 2.5–4.6)
Potassium: 4 mmol/L (ref 3.5–5.1)
Sodium: 134 mmol/L — ABNORMAL LOW (ref 135–145)

## 2024-10-27 MED ORDER — CEFAZOLIN SODIUM-DEXTROSE 2-4 GM/100ML-% IV SOLN
2.0000 g | Freq: Once | INTRAVENOUS | Status: AC
  Administered 2024-10-28: 2 g via INTRAVENOUS

## 2024-10-28 ENCOUNTER — Institutional Professional Consult (permissible substitution) (HOSPITAL_COMMUNITY)

## 2024-10-28 HISTORY — PX: IR TUNNELED CENTRAL VENOUS CATH PLC W IMG: IMG1939

## 2024-10-28 MED ORDER — HEPARIN SODIUM (PORCINE) 1000 UNIT/ML IJ SOLN: 10.0000 mL | Freq: Once | INTRAMUSCULAR | Status: DC

## 2024-10-28 MED ORDER — MIDAZOLAM HCL (PF) 2 MG/2ML IJ SOLN
INTRAMUSCULAR | Status: AC | PRN
  Administered 2024-10-28: 1 mg via INTRAVENOUS

## 2024-10-28 MED ORDER — LIDOCAINE HCL 1 % IJ SOLN
20.0000 mL | Freq: Once | INTRAMUSCULAR | Status: AC
  Administered 2024-10-28: 10 mL

## 2024-10-28 MED ORDER — FENTANYL CITRATE (PF) 100 MCG/2ML IJ SOLN
INTRAMUSCULAR | Status: AC | PRN
  Administered 2024-10-28: 25 ug via INTRAVENOUS

## 2024-10-28 NOTE — Progress Notes (Signed)
 Respiratory Progress Note Room #: 5E24  Admit Date: 10/01/2024  Pulmonologist:  Discharge Date:  DX/HX:  Isolation:  Code Status: Limited resuscitation   Airway Type? (Trach/ETT):  Size:  Cuffed/Cuffless?:  Placement at lip?:  High Risk Airway?:  Date of insertion:  Trach changed:  PMV started:  Cap started:  Decannulated/extubated:   Ventilator ID Number: Current ventilator settings:  Current weaning orders:  Date of first wean:  Date of liberation from vent:   Non-invasive Ventilation Current settings:   Home use? Yes/no:  Frequency? HS/PRN/Continuous:   Oxygen Therapy Modality? Nasal Cannula/NRB/Trach Collar/etc.: cannula  FiO2:  LPM: room air to 2lpm [place]   Treatments Drug: Dose: Frequency: Other:                        RT Progress Date Time Important Event or Update RT Initials  10/07/2024 1726 uses room air up to 2lpm/ desaturates with sleep. CMG  10/08/24 0547 0.5/lpm [place] SAP  10/08/2024 1659 RA NP  10/09/24 0014 RCVD pt on RA, placed pt on 2L [place] to maintain saturations greater than 92% NP  10/09/2024 1209 RA-2L [place] NP  10/10/24 0531 2/lpm [place] SAP  10/10/2024 1820 1L [place] CMG  10/11/24 0606 2/lpm [place] SAP  10/11/2024 1844 RA JM  10/12/2024 0438 Placed on 2L [place] @2303  hrs for the night. OO  10/12/2024 1540 RA JM  10/13/2024 0455 2 LNC OO  10/13/2024 1817 1 LNC JM  10/14/2024 0347 1 LNC. OO  10/14/2024 0832 1 LNC BP  10/15/24 0221 1/lpm [place] SAP  10/15/2024 0846 RA BP  10/20/2024 0812 0.5L [place], oral care done BP  10/21/2024 0244 0.5L [place], tolerating well. OO  10/21/2024 0845 1L [place] BP  10/22/2024 0748 0.5L [place] BP  10/23/2024 1718 room air today spo2 94-96% CMG  10/24/2024 1735 RA CD  10/25/2024 2338 RA OO  10/26/2024 1740 Started out on [place] 1L, then RA rest of the day, no distress CD  10/27/2024 0430 Placed on 2 LNC @0338  hrs.due to SAT in the low 80's. OO  10/27/2024 0846 1L  BP  10/28/2024 0439 1 LNC OO  10/28/2024 0607 1L  BP

## 2024-10-28 NOTE — Procedures (Signed)
 Interventional Radiology Procedure Note  Procedure: Right tunneled HD catheter placement  Complications: None  Estimated Blood Loss: < 10 mL  Findings: Right temp cath removed. New 23 cm tip to cuff Palindrome tunneled HD catheter placed via new access of right internal jugular vein. Tip in RA. OK to use.  Marcey DASEN. Luverne, M.D Pager:  [phone]

## 2024-10-29 LAB — RENAL FUNCTION PANEL
Albumin: <1.5 g/dL — ABNORMAL LOW (ref 3.5–5.0)
Anion gap: 11 (ref 5–15)
BUN: 51 mg/dL — ABNORMAL HIGH (ref 8–23)
CO2: 27 mmol/L (ref 22–32)
Calcium: 8.6 mg/dL — ABNORMAL LOW (ref 8.9–10.3)
Chloride: 101 mmol/L (ref 98–111)
Creatinine, Ser: 2.36 mg/dL — ABNORMAL HIGH (ref 0.61–1.24)
GFR, Estimated: 29 mL/min — ABNORMAL LOW (ref >60)
Glucose, Bld: 110 mg/dL — ABNORMAL HIGH (ref 70–99)
Phosphorus: 4.6 mg/dL (ref 2.5–4.6)
Potassium: 4 mmol/L (ref 3.5–5.1)
Sodium: 139 mmol/L (ref 135–145)

## 2024-10-29 LAB — CBC WITH DIFFERENTIAL/PLATELET
Abs Immature Granulocytes: 0.56 K/uL — ABNORMAL HIGH (ref 0.00–0.07)
Basophils Absolute: 0.3 K/uL — ABNORMAL HIGH (ref 0.0–0.1)
Basophils Relative: 2 %
Eosinophils Absolute: 0.3 K/uL (ref 0.0–0.5)
Eosinophils Relative: 2 %
HCT: 24.8 % — ABNORMAL LOW (ref 39.0–52.0)
Hemoglobin: 7.8 g/dL — ABNORMAL LOW (ref 13.0–17.0)
Immature Granulocytes: 4 %
Lymphocytes Relative: 6 %
Lymphs Abs: 0.8 K/uL (ref 0.7–4.0)
MCH: 30.4 pg (ref 26.0–34.0)
MCHC: 31.5 g/dL (ref 30.0–36.0)
MCV: 96.5 fL (ref 80.0–100.0)
Monocytes Absolute: 0.9 K/uL (ref 0.1–1.0)
Monocytes Relative: 7 %
Neutro Abs: 10 K/uL — ABNORMAL HIGH (ref 1.7–7.7)
Neutrophils Relative %: 79 %
Platelets: 228 K/uL (ref 150–400)
RBC: 2.57 MIL/uL — ABNORMAL LOW (ref 4.22–5.81)
RDW: 18.2 % — ABNORMAL HIGH (ref 11.5–15.5)
WBC: 12.9 K/uL — ABNORMAL HIGH (ref 4.0–10.5)
nRBC: 0.3 % — ABNORMAL HIGH (ref 0.0–0.2)

## 2024-10-31 LAB — RENAL FUNCTION PANEL
Albumin: <1.5 g/dL — ABNORMAL LOW (ref 3.5–5.0)
Anion gap: 7 (ref 5–15)
BUN: 46 mg/dL — ABNORMAL HIGH (ref 8–23)
CO2: 28 mmol/L (ref 22–32)
Calcium: 8.6 mg/dL — ABNORMAL LOW (ref 8.9–10.3)
Chloride: 101 mmol/L (ref 98–111)
Creatinine, Ser: 2.44 mg/dL — ABNORMAL HIGH (ref 0.61–1.24)
GFR, Estimated: 28 mL/min — ABNORMAL LOW (ref >60)
Glucose, Bld: 94 mg/dL (ref 70–99)
Phosphorus: 3 mg/dL (ref 2.5–4.6)
Potassium: 4.1 mmol/L (ref 3.5–5.1)
Sodium: 136 mmol/L (ref 135–145)

## 2024-10-31 LAB — CBC WITH DIFFERENTIAL/PLATELET
Abs Immature Granulocytes: 0.38 K/uL — ABNORMAL HIGH (ref 0.00–0.07)
Basophils Absolute: 0.2 K/uL — ABNORMAL HIGH (ref 0.0–0.1)
Basophils Relative: 2 %
Eosinophils Absolute: 0.2 K/uL (ref 0.0–0.5)
Eosinophils Relative: 2 %
HCT: 23.1 % — ABNORMAL LOW (ref 39.0–52.0)
Hemoglobin: 7.1 g/dL — ABNORMAL LOW (ref 13.0–17.0)
Immature Granulocytes: 3 %
Lymphocytes Relative: 8 %
Lymphs Abs: 0.9 K/uL (ref 0.7–4.0)
MCH: 29.6 pg (ref 26.0–34.0)
MCHC: 30.7 g/dL (ref 30.0–36.0)
MCV: 96.3 fL (ref 80.0–100.0)
Monocytes Absolute: 0.6 K/uL (ref 0.1–1.0)
Monocytes Relative: 5 %
Neutro Abs: 9.4 K/uL — ABNORMAL HIGH (ref 1.7–7.7)
Neutrophils Relative %: 80 %
Platelets: 188 K/uL (ref 150–400)
RBC: 2.4 MIL/uL — ABNORMAL LOW (ref 4.22–5.81)
RDW: 18.2 % — ABNORMAL HIGH (ref 11.5–15.5)
WBC: 11.7 K/uL — ABNORMAL HIGH (ref 4.0–10.5)
nRBC: 0.3 % — ABNORMAL HIGH (ref 0.0–0.2)

## 2024-11-03 LAB — CBC WITH DIFFERENTIAL/PLATELET
Abs Immature Granulocytes: 0.17 K/uL — ABNORMAL HIGH (ref 0.00–0.07)
Basophils Absolute: 0.2 K/uL — ABNORMAL HIGH (ref 0.0–0.1)
Basophils Relative: 2 %
Eosinophils Absolute: 0.2 K/uL (ref 0.0–0.5)
Eosinophils Relative: 2 %
HCT: 19.9 % — ABNORMAL LOW (ref 39.0–52.0)
Hemoglobin: 6.2 g/dL — CL (ref 13.0–17.0)
Immature Granulocytes: 2 %
Lymphocytes Relative: 8 %
Lymphs Abs: 0.9 K/uL (ref 0.7–4.0)
MCH: 30.1 pg (ref 26.0–34.0)
MCHC: 31.2 g/dL (ref 30.0–36.0)
MCV: 96.6 fL (ref 80.0–100.0)
Monocytes Absolute: 0.6 K/uL (ref 0.1–1.0)
Monocytes Relative: 6 %
Neutro Abs: 8.4 K/uL — ABNORMAL HIGH (ref 1.7–7.7)
Neutrophils Relative %: 80 %
Platelets: 157 K/uL (ref 150–400)
RBC: 2.06 MIL/uL — ABNORMAL LOW (ref 4.22–5.81)
RDW: 18.4 % — ABNORMAL HIGH (ref 11.5–15.5)
Smear Review: NORMAL
WBC: 10.4 K/uL (ref 4.0–10.5)
nRBC: 0.3 % — ABNORMAL HIGH (ref 0.0–0.2)

## 2024-11-03 LAB — RENAL FUNCTION PANEL
Albumin: <1.5 g/dL — ABNORMAL LOW (ref 3.5–5.0)
Anion gap: 8 (ref 5–15)
BUN: 71 mg/dL — ABNORMAL HIGH (ref 8–23)
CO2: 30 mmol/L (ref 22–32)
Calcium: 8.4 mg/dL — ABNORMAL LOW (ref 8.9–10.3)
Chloride: 100 mmol/L (ref 98–111)
Creatinine, Ser: 2.96 mg/dL — ABNORMAL HIGH (ref 0.61–1.24)
GFR, Estimated: 22 mL/min — ABNORMAL LOW (ref >60)
Glucose, Bld: 96 mg/dL (ref 70–99)
Phosphorus: 3 mg/dL (ref 2.5–4.6)
Potassium: 4.8 mmol/L (ref 3.5–5.1)
Sodium: 138 mmol/L (ref 135–145)

## 2024-11-03 LAB — HEMOGLOBIN AND HEMATOCRIT, BLOOD
HCT: 25.4 % — ABNORMAL LOW (ref 39.0–52.0)
Hemoglobin: 8.2 g/dL — ABNORMAL LOW (ref 13.0–17.0)

## 2024-11-03 LAB — PREPARE RBC (CROSSMATCH)

## 2024-11-04 LAB — BPAM RBC
Blood Product Expiration Date: 202512042359
ISSUE DATE / TIME: 202511171004
Unit Type and Rh: 9500

## 2024-11-04 LAB — TYPE AND SCREEN
ABO/RH(D): O NEG
Antibody Screen: NEGATIVE
Unit division: 0

## 2024-11-05 LAB — CBC WITH DIFFERENTIAL/PLATELET
Abs Immature Granulocytes: 0.17 K/uL — ABNORMAL HIGH (ref 0.00–0.07)
Basophils Absolute: 0.3 K/uL — ABNORMAL HIGH (ref 0.0–0.1)
Basophils Relative: 3 %
Eosinophils Absolute: 0.2 K/uL (ref 0.0–0.5)
Eosinophils Relative: 2 %
HCT: 23.4 % — ABNORMAL LOW (ref 39.0–52.0)
Hemoglobin: 7.6 g/dL — ABNORMAL LOW (ref 13.0–17.0)
Immature Granulocytes: 2 %
Lymphocytes Relative: 7 %
Lymphs Abs: 0.8 K/uL (ref 0.7–4.0)
MCH: 30.2 pg (ref 26.0–34.0)
MCHC: 32.5 g/dL (ref 30.0–36.0)
MCV: 92.9 fL (ref 80.0–100.0)
Monocytes Absolute: 0.7 K/uL (ref 0.1–1.0)
Monocytes Relative: 6 %
Neutro Abs: 9.6 K/uL — ABNORMAL HIGH (ref 1.7–7.7)
Neutrophils Relative %: 80 %
Platelets: 149 K/uL — ABNORMAL LOW (ref 150–400)
RBC: 2.52 MIL/uL — ABNORMAL LOW (ref 4.22–5.81)
RDW: 18.1 % — ABNORMAL HIGH (ref 11.5–15.5)
WBC: 11.7 K/uL — ABNORMAL HIGH (ref 4.0–10.5)
nRBC: 0.2 % (ref 0.0–0.2)

## 2024-11-05 LAB — RENAL FUNCTION PANEL
Albumin: <1.5 g/dL — ABNORMAL LOW (ref 3.5–5.0)
Anion gap: 14 (ref 5–15)
BUN: 67 mg/dL — ABNORMAL HIGH (ref 8–23)
CO2: 25 mmol/L (ref 22–32)
Calcium: 8.4 mg/dL — ABNORMAL LOW (ref 8.9–10.3)
Chloride: 97 mmol/L — ABNORMAL LOW (ref 98–111)
Creatinine, Ser: 2.65 mg/dL — ABNORMAL HIGH (ref 0.61–1.24)
GFR, Estimated: 25 mL/min — ABNORMAL LOW (ref >60)
Glucose, Bld: 104 mg/dL — ABNORMAL HIGH (ref 70–99)
Phosphorus: 3.2 mg/dL (ref 2.5–4.6)
Potassium: 4.4 mmol/L (ref 3.5–5.1)
Sodium: 136 mmol/L (ref 135–145)

## 2024-11-07 LAB — CBC WITH DIFFERENTIAL/PLATELET
Abs Immature Granulocytes: 0.09 K/uL — ABNORMAL HIGH (ref 0.00–0.07)
Basophils Absolute: 0.2 K/uL — ABNORMAL HIGH (ref 0.0–0.1)
Basophils Relative: 2 %
Eosinophils Absolute: 0.2 K/uL (ref 0.0–0.5)
Eosinophils Relative: 2 %
HCT: 20.7 % — ABNORMAL LOW (ref 39.0–52.0)
Hemoglobin: 6.5 g/dL — CL (ref 13.0–17.0)
Immature Granulocytes: 1 %
Lymphocytes Relative: 10 %
Lymphs Abs: 0.9 K/uL (ref 0.7–4.0)
MCH: 30 pg (ref 26.0–34.0)
MCHC: 31.4 g/dL (ref 30.0–36.0)
MCV: 95.4 fL (ref 80.0–100.0)
Monocytes Absolute: 0.5 K/uL (ref 0.1–1.0)
Monocytes Relative: 5 %
Neutro Abs: 7.3 K/uL (ref 1.7–7.7)
Neutrophils Relative %: 80 %
Platelets: 130 K/uL — ABNORMAL LOW (ref 150–400)
RBC: 2.17 MIL/uL — ABNORMAL LOW (ref 4.22–5.81)
RDW: 17.6 % — ABNORMAL HIGH (ref 11.5–15.5)
Smear Review: NORMAL
WBC: 9.1 K/uL (ref 4.0–10.5)
nRBC: 0.2 % (ref 0.0–0.2)

## 2024-11-07 LAB — RENAL FUNCTION PANEL
Albumin: <1.5 g/dL — ABNORMAL LOW (ref 3.5–5.0)
Anion gap: 7 (ref 5–15)
BUN: 65 mg/dL — ABNORMAL HIGH (ref 8–23)
CO2: 29 mmol/L (ref 22–32)
Calcium: 8.4 mg/dL — ABNORMAL LOW (ref 8.9–10.3)
Chloride: 97 mmol/L — ABNORMAL LOW (ref 98–111)
Creatinine, Ser: 2.41 mg/dL — ABNORMAL HIGH (ref 0.61–1.24)
GFR, Estimated: 28 mL/min — ABNORMAL LOW (ref >60)
Glucose, Bld: 110 mg/dL — ABNORMAL HIGH (ref 70–99)
Phosphorus: 2.1 mg/dL — ABNORMAL LOW (ref 2.5–4.6)
Potassium: 4.2 mmol/L (ref 3.5–5.1)
Sodium: 133 mmol/L — ABNORMAL LOW (ref 135–145)

## 2024-11-07 LAB — HEMOGLOBIN AND HEMATOCRIT, BLOOD
HCT: 27.1 % — ABNORMAL LOW (ref 39.0–52.0)
Hemoglobin: 8.9 g/dL — ABNORMAL LOW (ref 13.0–17.0)

## 2024-11-07 LAB — PREPARE RBC (CROSSMATCH)

## 2024-11-08 LAB — BPAM RBC
Blood Product Expiration Date: 202512042359
ISSUE DATE / TIME: 202511210802
Unit Type and Rh: 9500

## 2024-11-08 LAB — CBC
HCT: 25 % — ABNORMAL LOW (ref 39.0–52.0)
Hemoglobin: 8 g/dL — ABNORMAL LOW (ref 13.0–17.0)
MCH: 28.9 pg (ref 26.0–34.0)
MCHC: 32 g/dL (ref 30.0–36.0)
MCV: 90.3 fL (ref 80.0–100.0)
Platelets: 134 K/uL — ABNORMAL LOW (ref 150–400)
RBC: 2.77 MIL/uL — ABNORMAL LOW (ref 4.22–5.81)
RDW: 17.6 % — ABNORMAL HIGH (ref 11.5–15.5)
WBC: 14.5 K/uL — ABNORMAL HIGH (ref 4.0–10.5)
nRBC: 0 % (ref 0.0–0.2)

## 2024-11-08 LAB — TYPE AND SCREEN
ABO/RH(D): O NEG
Antibody Screen: NEGATIVE
Unit division: 0

## 2024-11-10 LAB — CBC WITH DIFFERENTIAL/PLATELET
Abs Immature Granulocytes: 0.09 K/uL — ABNORMAL HIGH (ref 0.00–0.07)
Basophils Absolute: 0.2 K/uL — ABNORMAL HIGH (ref 0.0–0.1)
Basophils Relative: 2 %
Eosinophils Absolute: 0.2 K/uL (ref 0.0–0.5)
Eosinophils Relative: 2 %
HCT: 23.6 % — ABNORMAL LOW (ref 39.0–52.0)
Hemoglobin: 7.5 g/dL — ABNORMAL LOW (ref 13.0–17.0)
Immature Granulocytes: 1 %
Lymphocytes Relative: 6 %
Lymphs Abs: 0.7 K/uL (ref 0.7–4.0)
MCH: 29.4 pg (ref 26.0–34.0)
MCHC: 31.8 g/dL (ref 30.0–36.0)
MCV: 92.5 fL (ref 80.0–100.0)
Monocytes Absolute: 0.7 K/uL (ref 0.1–1.0)
Monocytes Relative: 7 %
Neutro Abs: 8.6 K/uL — ABNORMAL HIGH (ref 1.7–7.7)
Neutrophils Relative %: 82 %
Platelets: 121 K/uL — ABNORMAL LOW (ref 150–400)
RBC: 2.55 MIL/uL — ABNORMAL LOW (ref 4.22–5.81)
RDW: 17 % — ABNORMAL HIGH (ref 11.5–15.5)
WBC: 10.5 K/uL (ref 4.0–10.5)
nRBC: 0 % (ref 0.0–0.2)

## 2024-11-10 LAB — RENAL FUNCTION PANEL
Albumin: 1.5 g/dL — ABNORMAL LOW (ref 3.5–5.0)
Anion gap: 10 (ref 5–15)
BUN: 57 mg/dL — ABNORMAL HIGH (ref 8–23)
CO2: 26 mmol/L (ref 22–32)
Calcium: 8.6 mg/dL — ABNORMAL LOW (ref 8.9–10.3)
Chloride: 100 mmol/L (ref 98–111)
Creatinine, Ser: 2.57 mg/dL — ABNORMAL HIGH (ref 0.61–1.24)
GFR, Estimated: 26 mL/min — ABNORMAL LOW (ref >60)
Glucose, Bld: 87 mg/dL (ref 70–99)
Phosphorus: 3.8 mg/dL (ref 2.5–4.6)
Potassium: 4.3 mmol/L (ref 3.5–5.1)
Sodium: 136 mmol/L (ref 135–145)

## 2024-11-10 NOTE — Unmapped External Note (Signed)
 Occupational Therapy      Treatment  Patient Name: Ricardo [name] Patient Birthdate: September 05, 1951   Patient Subjective Report - Let's get stronger    Pain Assessment Pain Context: Therapy Assessment Prior to Treatment (11/10/24 1558) Pain Assessment: NRS 0-10 (11/10/24 1558) Pain Score: 0 - No pain (11/10/24 1558) Pain Severity - NRS (Calculated): No pain (11/10/24 1558) Vital Signs Vitals Context: Resting (11/10/24 1558) Pulse: 88 (11/10/24 1558) Heart Rate Source: Monitor (11/10/24 1558) SpO2: 100 % (11/10/24 1558)         Cognition:      Overall Cognitive Status:  Within Functional Limits  Safety Awareness:      Safety Awareness:  Fair          Strength:  Yes R Shoulder Motion-Strength:  Flexion, Other (Comment) and Horizontal ABduction (chest press) R Shoulder Exercise Position-Strength:  Against gravity and Supine R Shoulder Exercise Equipment-Strength:  Dumbbell R Shoulder Weight/Reps/Sets-Strength:  2lbs 12x Left Shoulder Motion-Strength:  Flexion, Horizontal ABduction and Other (Comment) (chest press) L Shoulder Exercise Position-Strength:  Supine and Against gravity L Shoulder Exercise Equipment-Strength:  Dumbbell L Shoulder Weights/Reps/Sets-Strength:  2lbs 12x R Elbow Motion-Strength:  Elbow flexion and Elbow extension R Elbow Exercise Position-Strength:  Supine and Against gravity R Elbow Exercise Equipment-Strength:  Dumbbell R Elbow Weights/Reps/Sets-Strength:  2lbs 12x L Elbow Motion-Strength:  Elbow flexion and Elbow extension L Elbow Exercise Position-Strength:  Supine and Against gravity L Elbow Exercise Equipment-Strength:  Dumbbell L Elbow Weights/Reps/Sets-Strength:  2lbs 12x                                   Fall Prevention Recommendations:    Fall Prevention/ Other Recommendations:  Low bed, bed alarm, chair alarm   Communicated Via:  Verbal instruction with staff and Patient/family education Outcomes:    OT Summary  Comments:  Pt found supine in bed and in agreement to engage in therapy. Pt engaged in BUE strengthening with use of 2lb weight with tactile assistance for correct positioning and movement for appropriate muscle activation. Pt required rest breaks between sessions due to fatigue. Pt's vitals remain WNL throughout session. Pt left supine in bed with HOB elevated, call bell, and bed alarm   OT Summary Plan of Care:  Continue with current plan of care        Pain Evaluation and Follow-up Pain Reassessment: 0, No pain (11/10/24 1558) Nursing notified of patient's pain assessment: Not indicated - pain score 2 or less (11/10/24 1558)      Therapy Minutes   Enter Time Entry for Time Based Charges Here: Time In : 1558 Time Out: 1612 Total Time with Patient (Min): 14 min Therapeutic Exercise: 14 Total Time to be Billed (Min): 14     BURZLER, MELISSA, OT 11/10/2024

## 2024-11-10 NOTE — Progress Notes (Signed)
 PATIENT INFORMATION   Patient Name: Ricardo [name]  Date of Birth: October 15, 1951  MRN: [id number]   Date of Admission: 10/01/2024  5:53 PM Length of Stay: 40 Length of Stay: 40   Primary Care Physician: No primary care provider on file.  Attending Physician:BROWN, COREAN BIRCH, MD    SUBJECTIVE   Chief Complaint:  Status post septic shock and acute respiratory failure.  Interval Summary:  Ricardo [name] is a 73 y.o. male that has been admitted to [facility].  The patient was admitted to [facility] from 08/03/2024 until 10/01/2024.  The patient was [facility] for UTI, septic shock, acute respiratory failure an absolute neutropenia.  He was subsequently transferred to [facility] medical center for anuric  renal failure in the setting of acute hypoxemic respiratory failure.  The patient underwent CRRT from 08/06/2024 and was transitioned to intermittent hemodialysis.  The patient's course at the outside facility was complicated by requiring high O2.  Patient was on BiPAP, high-flow nasal cannula.  The patient was intubated but self extubated on 09/01/2024.  The patient was intubated twice while at the outside facility.  The patient has stated he did not want tracheostomy placed so he changed his status to DNR/DNI.  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  While at the outside facility patient was diagnosed with ESBL bacteremia and Enterococcus faecalis peritonitis.  He was started on ertapenem, for a total of 2 weeks to end on 10/05/2024.  09/28/2024 patient underwent paracentesis with removal of 1.8 L. thirty fluid was positive for SPB ,cultures were positive for Enterococcus faecalis and patient was discharged on IV ampicillin and Rocephin  to end on 10/07/2021  Subjective:  Patient seen and examined, he is without complaints sitting up on the side of the bed with physical therapy OBJECTIVE    Objective  Vital Signs:  BP 125/68   Pulse 84   Temp 97.5 F (36.4 C)   Resp 18   Ht 6' 1 (1.854 m)   Wt 121 lb 4.1 oz (55 kg)   SpO2 99%   BMI 16.00 kg/m    I/O last 24 Hours:  Intake/Output Summary (Last 24 hours) at 11/10/2024 1221 Last data filed at 11/10/2024 1127 Gross per 24 hour  Intake 120 ml  Output 2000 ml  Net -1880 ml     Physical Exam:  Elderly thin gentleman sitting up on the side of the bed with physical therapy HEENT:  Normocephalic atraumatic, pupils equal round and reactive, EOMs intact, nares  patent, oropharynx bloody  Neck : tracheostomy  midline, no secretions  Cardiovascular : S1-S2 no murmurs rubs or gallops Lungs: clear to auscultation to the base Abdomen: positive bowel sounds soft peg intact Extremities: no clubbing cyanosis or edema  Neuro:  alert, talking  Labs:  Sodium 136 potassium 4.3 chloride 100 CO2 26 BUN 57 creatinine 2.57 glucose 87  WBC 10.5 hemoglobin 7.5 hematocrit 23.6 platelets 121  Xrays:  CXR 10/20/2024:  Mild interstitial edema with bilateral pleural effusions and bibasilar atelectasis.     ASSESSMENT AND PLAN   Ricardo [name] is a 73 y.o. male  who was admitted on 10/01/2024 with Acute kidney injury due to sepsis [N17.9, A41.9] .    Assessment/Plan:  ESBL bacteremia.  The patient has completed a course of ertapenem for his bacteremia.  Repeat blood cultures on 10/04/2024 were negative.    Enterococcus faecalis peritonitis.  The patient  completed a course of IV ampicillin and Rocephin  for his peritonitis on 10/14/2024.   We will continue to monitor his white blood cell count and fever curve.  Acute kidney injury currently on hemodialysis.  He went into acute renal failure due to septic shock with ATN.  The patient is status post right tunneled HD catheter placement on 10/28/2024.  Nephrology is currently following and we will continue on dialysis on a M/W/F schedule.    Acute metabolic encephalopathy.  He developed  encephalopathy with the acute delirium at the outside hospital.  He is currently on sertraline  and trazodone  at this time.  His mental status has improved over this and he is now back to baseline per his daughter.  UTI.  His urine culture from 10/21/2024 shows ESBL Klebsiella pneumoniae that is resistant to Rocephin .  It was sensitive to meropenem where he completed a course of IV meropenem on 11/01/2024.   Myelofibrosis/anemia.  He has some anemia andthrombocytopenia but at baseline, he does not have leukocytosis.  We will transfuse him as needed.  They have started him on Micera for his anemia of chronic kidney disease.  He had a negative stool guaiac on 10/05/2024.  Patient was transfused 10/06/2024 .  Hb was 5.4 pm 10/27; 2 units PRBC transfusion.  He was transfused on 10/14/2024 and again on 10/18/2024.  We will continue to monitor H&H.  His iron studies show a low iron level.  He was transfused again on 10/22/2024.  His anemia is probably related to his myelofibrosis and chronic kidney disease.  He was transfused again on 11/17.  We will continue to monitor his H&H and transfuse as needed.  Hypothyroidism.  He remains on levothyroxine at this time.  Paraphimosis.  He was seen by Urology in 08/2024.  A Foley was placed and was then removed on 09/25/2024.  He currently does not have a Foley catheter.  Atrial fibrillation/sinus tachycardia.  He seems to be rate controlled at this time.  He is on amiodarone as well as metoprolol.  His Aspirin  held due to severe anemia.  The patient has had some sinus tachycardia where we increased his metoprolol to 25 mg t.i.d..  We will continue to monitor with telemetry.  His sinus tachycardia has been intermittent and we will continue his current dosing.  We increased his amiodarone from 100 mg to 200 mg daily but he still has some sinus tachycardia.  Cardiology was consulted and recommends starting him on diltiazem.  He is now on both Cardizem CD 360 mg daily and  metoprolol 50 mg b.i.d. which seems to be controlling his heart rate.  We will continue to monitor him on telemetry.  Wounds.  The patient has an unstageable sacral pressure ulcer as well as a deep tissue injury to his left ear and his nose.  Wound Care is following.  Severe protein calorie malnutrition/dysphagia.  He is status post PEG tube placement.  He has had a PEG tube malfunction where  Interventional Radiology has been consulted to replace his PEG tube.  He is currently on IV fluids at this time.  Debility.  PT and OT therapies are currently following.  Prevention.  heparin  for DVT prophylaxis held (due to severe anemia) and omeprazole for GI prophylaxis..  Code Status:  Limited Resuscitation   Spent a total of 35  minutes on this encounter. This includes reviewing patient's extensive history, assessment and visit with the patient as well as documentation time. Time spent also includes IDT collaboration.  SIGNATURE   Electronically signed:  DELORES COREAN BIRCH, MD  11/10/2024 at 12:21 PM EST

## 2024-11-11 ENCOUNTER — Institutional Professional Consult (permissible substitution) (HOSPITAL_COMMUNITY)

## 2024-11-11 HISTORY — PX: IR GASTROSTOMY TUBE MOD SED: IMG625

## 2024-11-11 LAB — PROTIME-INR
INR: 1.5 — ABNORMAL HIGH (ref 0.8–1.2)
Prothrombin Time: 18.8 s — ABNORMAL HIGH (ref 11.4–15.2)

## 2024-11-11 MED ORDER — CEFAZOLIN SODIUM-DEXTROSE 2-4 GM/100ML-% IV SOLN
INTRAVENOUS | Status: AC | PRN
Start: 2024-11-11 — End: 2024-11-11
  Administered 2024-11-11: 2 g via INTRAVENOUS

## 2024-11-11 MED ORDER — MIDAZOLAM HCL (PF) 2 MG/2ML IJ SOLN
INTRAMUSCULAR | Status: AC | PRN
Start: 2024-11-11 — End: 2024-11-11
  Administered 2024-11-11 (×2): 1 mg via INTRAVENOUS

## 2024-11-11 MED ORDER — IOHEXOL 300 MG/ML  SOLN
50.0000 mL | Freq: Once | INTRAMUSCULAR | Status: AC | PRN
  Administered 2024-11-11: 15 mL

## 2024-11-11 MED ORDER — LIDOCAINE-EPINEPHRINE 1 %-1:100000 IJ SOLN
20.0000 mL | Freq: Once | INTRAMUSCULAR | Status: AC
  Administered 2024-11-11: 10 mL via INTRADERMAL

## 2024-11-11 MED ORDER — FENTANYL CITRATE (PF) 100 MCG/2ML IJ SOLN
INTRAMUSCULAR | Status: AC | PRN
Start: 2024-11-11 — End: 2024-11-11
  Administered 2024-11-11 (×2): 50 ug via INTRAVENOUS

## 2024-11-11 MED ORDER — LIDOCAINE VISCOUS HCL 2 % MT SOLN
15.0000 mL | Freq: Once | OROMUCOSAL | Status: AC
  Administered 2024-11-11: 5 mL via OROMUCOSAL

## 2024-11-11 NOTE — Consult Note (Addendum)
 Chief Complaint: Patient was seen in consultation today for malfunction gastrostomy tube, with consideration for replacement.  Referring Provider(s): Dr. Selinda Fleeta Finger, MD   Supervising Physician: Jennefer Rover  Patient Status: Ricardo [name]  Patient is Full Code  History of Present Illness: Ricardo [name] is a 73 y.o. male  with PMHx notable for malnutrition/dysphagia, EtOH abuse, prostate cancer status post radiation, gout, anemia, and others as delineated below.  Patient was initially admitted to [facility] for UTI, septic shock, respiratory failure, and absolute neutropenia.  Patient was then transferred to [facility] for renal failure and acute respiratory failure. Patient was initiated on hemodialysis, and intubated on several occasions.  At that time, patient refused tracheostomy, and was made DNR/DNI.  He was ultimately transferred to [facility], where hospital stay was complicated by ESBL bacteremia and Enterococcus peritonitis.  He continues with acute kidney injury requiring HD, acute metabolic encephalopathy, and now with Klebsiella UTI. Patient received a pull-through PEG tube, though it was noted malfunctioning with inner bumper noted at skin level.   Interventional Radiology was requested for PEG tube replacement. The request was reviewed by Dr. Jennefer after CT imaging, and approved for removal of current gastrostomy tube and subsequent new gastrostomy tube placement. Patient is tentatively scheduled for same in IR today.   Patient is alert and sitting up at edge of bed, calm. RN staff at bedside.  Patient does not appear in acute pain or distress, but states he is uncomfortable from sitting up due to his gluteal ulcer. Patient is noted with a bloodied lip, with blood in his mouth. Per RN staff, patient as chewed on his lip.  Due to patient's encephalopathy and confusion, ROS cannot be reliably obtained.    Past Medical History:  Diagnosis Date    Alcohol abuse    Anemia    Arthritis    Carpal tunnel syndrome    Gout    History of radiation therapy 04/10/2017-06/04/2017   Site/dose:   The prostatic fossa was treated to 68.4 Gy in 38 fractions of 1.8 Gy   Myelofibrosis (HCC)    Prostate cancer [facility])     Past Surgical History:  Procedure Laterality Date   COLONOSCOPY WITH PROPOFOL  N/A 09/10/2023   Procedure: COLONOSCOPY WITH PROPOFOL ;  Surgeon: Cindie Carlin POUR, DO;  Location: [facility];  Service: Endoscopy;  Laterality: N/A;  11:30 am, asa 3   CYSTECTOMY     left bicept   IR TUNNELED CENTRAL VENOUS CATH [facility] W IMG  10/28/2024   POLYPECTOMY  09/10/2023   Procedure: POLYPECTOMY;  Surgeon: Cindie Carlin POUR, DO;  Location: [facility];  Service: Endoscopy;;   PROSTATECTOMY  2014   TENDON REPAIR     right thumb    Allergies: Patient has no known allergies.  Medications: Prior to Admission medications   Medication Sig Start Date End Date Taking? Authorizing Provider  allopurinol  (ZYLOPRIM ) 300 MG tablet Take 300 mg by mouth at bedtime. 10/12/22   [provider]  aspirin  EC 81 MG tablet Take 1 tablet (81 mg total) by mouth daily with breakfast. Swallow whole. 07/10/24   Pearlean Manus, MD  calcium -vitamin D (OSCAL WITH D) 500-200 MG-UNIT tablet Take 1 tablet by mouth.    [provider]  cyanocobalamin  (VITAMIN B12) 500 MCG tablet Take 2 tablets by mouth daily. 07/26/23   [provider]  fluticasone (FLONASE) 50 MCG/ACT nasal spray Place 2 sprays into both nostrils daily. 07/26/23  [provider]  latanoprost (XALATAN) 0.005 % ophthalmic solution Apply 1 drop to eye at bedtime.    [provider]  loratadine (CLARITIN) 10 MG tablet Take 10 mg by mouth daily.    [provider]  MELATONIN PO Take 1 mg by mouth at bedtime.    [provider]  methocarbamol (ROBAXIN) 500 MG tablet Take 500 mg by mouth in the morning and at bedtime.    [provider]   mirtazapine  (REMERON ) 15 MG tablet Take 15 mg by mouth at bedtime.    [provider]  Multiple Vitamin (MULTIVITAMIN) capsule Take by mouth.    [provider]  pantoprazole  (PROTONIX ) 40 MG tablet Take 40 mg by mouth daily as needed (heartburn, acid reflux).    [provider]  polyethylene glycol (MIRALAX  / GLYCOLAX ) 17 g packet Take 17 g by mouth daily as needed for mild constipation.    [provider]  sertraline  (ZOLOFT ) 50 MG tablet Take 50 mg by mouth daily.    [provider]     Family History  Problem Relation Age of Onset   Heart disease Mother    Hypertension Sister    Cancer Maternal Aunt        breast   Cancer Maternal Uncle        colon   Cancer Maternal Uncle        lung    Social History   Socioeconomic History   Marital status: Divorced    Spouse name: Not on file   Number of children: Not on file   Years of education: Not on file   Highest education level: Not on file  Occupational History   Not on file  Tobacco Use   Smoking status: Never    Passive exposure: Never   Smokeless tobacco: Current    Types: Snuff  Vaping Use   Vaping status: Never Used  Substance and Sexual Activity   Alcohol use: Not Currently    Comment: last has ETOH 7 years ago as of 09/07/2023   Drug use: No   Sexual activity: Not Currently  Other Topics Concern   Not on file  Social History Narrative   Not on file   Social Drivers of Health   Financial Resource Strain: Low Risk  (10/02/2024)   Received from [facility]   Overall Financial Resource Strain (CARDIA)    Difficulty of Paying Living Expenses: Not hard at all  Food Insecurity: No Food Insecurity (10/02/2024)   Received from [facility]   Hunger Vital Sign    Within the past 12 months, you worried that your food would run out before you got the money to buy more.: Never true    Within the past 12 months, the food you bought just didn't last and you didn't have  money to get more.: Never true  Transportation Needs: No Transportation Needs (10/01/2024)   Received from [facility]   SM SDOH Transportation Source    Has lack of transportation kept you from medical appointments or from getting medications?: No    Has lack of transportation kept you from meetings, work, or from getting things needed for daily living?: No  Physical Activity: Not on file  Stress: No Stress Concern Present (10/01/2024)   Received from [facility]   [employer] of Occupational Health - Occupational Stress Questionnaire    Feeling of Stress : Not at all  Recent Concern: Stress - Stress Concern Present (08/03/2024)  Received from [facility] of Occupational Health - Occupational Stress Questionnaire    Do you feel stress - tense, restless, nervous, or anxious, or unable to sleep at night because your mind is troubled all the time - these days?: To some extent  Social Connections: Socially Isolated (10/02/2024)   Received from [facility]   Social Connection and Isolation Panel    In a typical week, how many times do you talk on the phone with family, friends, or neighbors?: Three times a week    How often do you get together with friends or relatives?: Three times a week    How often do you attend church or religious services?: Never    Do you belong to any clubs or organizations such as church groups, unions, fraternal or athletic groups, or school groups?: No    How often do you attend meetings of the clubs or organizations you belong to?: Never    Are you married, widowed, divorced, separated, never married, or living with a partner?: Widowed     Review of Systems: A 12 point ROS discussed and pertinent positives are indicated in the HPI above.  All other systems are negative.  Vital Signs: BP (!) 115/58   Pulse 73   Resp 17   SpO2 95%   Advance Care Plan: The advanced care place/surrogate decision maker was discussed at the  time of visit and the patient did not wish to discuss or was not able to name a surrogate decision maker or provide an advance care plan.  Physical Exam Vitals reviewed.  Constitutional:      General: He is not in acute distress.    Appearance: Normal appearance.  HENT:     Mouth/Throat:     Comments: Patient with injury to lower lip on left side, actively bleeding, with blood in mouth. Per staff, patient has bit himself.  Cardiovascular:     Rate and Rhythm: Normal rate and regular rhythm.     Pulses: Normal pulses.     Heart sounds: Normal heart sounds.  Pulmonary:     Effort: Pulmonary effort is normal.     Breath sounds: Normal breath sounds.  Abdominal:     General: Abdomen is flat. There is no distension.     Palpations: Abdomen is soft.     Tenderness: There is abdominal tenderness.     Comments: Gastrostomy tube in place, covered by a dressing. Patient with tenderness to palpation on right side of dressing.  Musculoskeletal:     Cervical back: Normal range of motion.     Comments: Patient able to sit up in bed.  Skin:    General: Skin is warm and dry.  Neurological:     Mental Status: He is alert. Mental status is at baseline.  Psychiatric:        Mood and Affect: Mood normal.        Behavior: Behavior normal.     Imaging: CT ABDOMEN WO CONTRAST Result Date: 11/11/2024 EXAM: CT ABDOMEN WITHOUT CONTRAST 11/11/2024 02:21:08 AM TECHNIQUE: CT of the abdomen was performed without the administration of intravenous contrast. Multiplanar reformatted images are provided for review. Automated exposure control, iterative reconstruction, and/or weight based adjustment of the mA/kV was utilized to reduce the radiation dose to as low as reasonably achievable. COMPARISON: 07/08/2024 CLINICAL HISTORY: preoperative evaluation for IR gtube placement FINDINGS: LOWER CHEST: Moderate bilateral pleural effusions with compressive atelectasis in the lower lobes. HEPATOBILIARY: The visualized  liver  is unremarkable. Gallbladder is unremarkable. SPLEEN: Splenomegaly with a craniocaudal length of 16.5 cm. Low-density lesion in the inferior spleen measures 2.5 x 1.7 cm compared to 4.2 x 3.8 cm previously. PANCREAS: Pancreas demonstrates no acute abnormality. ADRENAL GLANDS: Adrenal glands demonstrate no acute abnormality. KIDNEYS: Left lower pole renal cyst measures 4.3 cm, stable. Per consensus, no follow-up is needed for simple Bosniak type 1 and 2 renal cysts, unless the patient has a malignancy history or risk factors. No stones in the kidneys or proximal ureters. No hydronephrosis. No perinephric or periureteral stranding. GI AND BOWEL: Gastrostomy tube in place, which appears to be pulled out of the stomach, with the retention button in the anterior abdominal wall. Stomach and duodenal sweep demonstrate no acute abnormality. There is no bowel obstruction. No abnormal bowel wall thickening or distension. PERITONEUM AND RETROPERITONEUM: No ascites or free air. Aorta is normal in caliber. LYMPH NODES: No lymphadenopathy. BONES AND SOFT TISSUES: Right iliopsoas muscle hematoma measuring 5.7 x 3.8 cm on image 66 of series 3. No acute abnormality of the visualized bones. IMPRESSION: 1. Gastrostomy tube in place, which appears to be pulled out of the stomach, with the retention button in the anterior abdominal wall. 2. Right iliopsoas muscle hematoma measuring 5.7 x 3.8 cm. 3. Moderate bilateral pleural effusions with compressive atelectasis in the lower lobes. 4. Splenomegaly with a low-density inferior splenic lesion decreased in size to 2.5 x 1.7 cm from 4.2 x 3.8 cm previously. Electronically signed by: Franky Crease MD 11/11/2024 02:32 AM EST RP Workstation: [id number]   IR TUNNELED CENTRAL VENOUS CATH [facility] W IMG Result Date: 10/28/2024 CLINICAL DATA:  Renal failure and need for tunneled hemodialysis catheter. Status post prior placement of non tunneled temporary dialysis catheter at outside hospital.  EXAM: TUNNELED CENTRAL VENOUS HEMODIALYSIS CATHETER PLACEMENT WITH ULTRASOUND AND FLUOROSCOPIC GUIDANCE ANESTHESIA/SEDATION: Moderate (conscious) sedation was employed during this procedure. A total of Versed  1.0 mg and Fentanyl  25 mcg was administered intravenously. Moderate Sedation Time: 21 minutes. The patient's level of consciousness and vital signs were monitored continuously by radiology nursing throughout the procedure under my direct supervision. MEDICATIONS: 2 g IV Ancef . FLUOROSCOPY: Radiation Exposure Index: 2.2 mGy [place] PROCEDURE: The procedure, risks, benefits, and alternatives were explained to the patient's daughter. Questions regarding the procedure were encouraged and answered. The patient's daughter understands and consents to the procedure. A timeout was performed prior to initiating the procedure. The pre-existing right jugular non tunneled temporary dialysis catheter was removed and hemostasis obtained with manual compression. The right neck and chest were prepped with chlorhexidine in a sterile fashion, and a sterile drape was applied covering the operative field. Maximum barrier sterile technique with sterile gowns and gloves were used for the procedure. Local anesthesia was provided with 1% lidocaine . Ultrasound was performed to confirm patency of the right internal jugular vein. An ultrasound image was saved and recorded. After creating a small venotomy incision, a 21 gauge needle was advanced into the right internal jugular vein under direct, real-time ultrasound guidance. After securing guidewire access, an 8 Fr dilator was placed. A J-wire was kinked to measure appropriate catheter length. A Palindrome tunneled hemodialysis catheter measuring 23 cm from tip to cuff was chosen for placement. This was tunneled in a retrograde fashion from the chest wall to the venotomy incision. At the venotomy, serial dilatation was performed and a 15 Fr peel-away sheath was placed over a guidewire. The  catheter was then placed through the sheath and the sheath removed. Final catheter  positioning was confirmed and documented with a fluoroscopic spot image. The catheter was aspirated, flushed with saline, and injected with appropriate volume heparin  dwells. The venotomy incision was closed with subcutaneous and subcuticular 4-0 Vicryl. Dermabond was applied to the incision. The catheter exit site was secured with 0-Prolene retention sutures. COMPLICATIONS: None.  No pneumothorax. FINDINGS: After new tunneled catheter placement, the tip lies in the right atrium. The catheter aspirates normally and is ready for immediate use. IMPRESSION: Placement of tunneled hemodialysis catheter via the right internal jugular vein. The catheter tip lies in the right atrium. The catheter is ready for immediate use. Electronically Signed   By: Marcey Moan M.D.   On: 10/28/2024 12:05   DG Chest 1 View Result Date: 10/20/2024 EXAM: 1 VIEW(S) XRAY OF THE CHEST 10/20/2024 12:04:00 PM COMPARISON: 10/09/2024 CLINICAL HISTORY: Mental status alteration FINDINGS: LINES, TUBES AND DEVICES: Stable right IJ central line. LUNGS AND PLEURA: Small to moderate bilateral pleural effusions, stable from prior exam. Bibasilar opacities stable from prior exam, which is favored to represent atelectasis in the setting of pleural effusions. Mild pulmonary edema. No pneumothorax. HEART AND MEDIASTINUM: No acute abnormality of the cardiac and mediastinal silhouettes. BONES AND SOFT TISSUES: No acute osseous abnormality. IMPRESSION: 1. Mild interstitial edema with similar appearance of bilateral pleural effusions and bibasilar atelectasis. Electronically signed by: Waddell Calk MD 10/20/2024 02:04 PM EST RP Workstation: [id number]    Labs:  CBC: Recent Labs    11/05/24 0404 11/07/24 0344 11/07/24 2117 11/08/24 0055 11/10/24 0322  WBC 11.7* 9.1  --  14.5* 10.5  HGB 7.6* 6.5* 8.9* 8.0* 7.5*  HCT 23.4* 20.7* 27.1* 25.0* 23.6*  PLT 149* 130*   --  134* 121*    COAGS: Recent Labs    07/03/24 1253 07/04/24 0401  INR 1.3*  --   APTT  --  41*    BMP: Recent Labs    11/03/24 0510 11/05/24 0404 11/07/24 0344 11/10/24 0322  NA 138 136 133* 136  K 4.8 4.4 4.2 4.3  CL 100 97* 97* 100  CO2 30 25 29 26   GLUCOSE 96 104* 110* 87  BUN 71* 67* 65* 57*  CALCIUM  8.4* 8.4* 8.4* 8.6*  CREATININE 2.96* 2.65* 2.41* 2.57*  GFRNONAA 22* 25* 28* 26*    LIVER FUNCTION TESTS: Recent Labs    07/04/24 0401 07/07/24 0406 07/09/24 0413 10/02/24 0411 10/03/24 0343 11/03/24 0510 11/05/24 0404 11/07/24 0344 11/10/24 0322  BILITOT 2.5* 1.4* 1.4* 0.7  --   --   --   --   --   AST 39 41 45* 21  --   --   --   --   --   ALT 16 21 21 9   --   --   --   --   --   ALKPHOS 77 89 92 187*  --   --   --   --   --   PROT 6.3* 6.1* 6.6 6.2*  --   --   --   --   --   ALBUMIN 3.3* 3.2* 3.5 1.6*   < > <1.5* <1.5* <1.5* 1.5*   < > = values in this interval not displayed.    TUMOR MARKERS: No results for input(s): AFPTM, CEA, CA199, CHROMGRNA in the last 8760 hours.  Assessment and Plan: Patient was initially admitted to [facility] for UTI, septic shock, respiratory failure, and absolute neutropenia.  Patient was then transferred to [facility] for renal failure and acute  respiratory failure. Patient was initiated on hemodialysis, and intubated on several occasions.  At that time, patient refused tracheostomy, and was made DNR/DNI.  He was ultimately transferred to [facility], where hospital stay was complicated by ESBL bacteremia and Enterococcus peritonitis.  He continues with acute kidney injury requiring HD, acute metabolic encephalopathy, and now with Klebsiella UTI. Patient received a pull-through PEG tube, though it was noted malfunctioning with inner bumper noted at skin level. Interventional Radiology was requested for PEG tube replacement, and ultimately approved for removal of current gastrostomy tube and  subsequent new gastrostomy tube placement.  Patient will tentatively present for gastrostomy tube removal and subsequent new gastrostomy tube placement in IR today.  Patient has been NPO since 11/20. All labs and medications are within acceptable parameters.  No pertinent allergies.   Risks and benefits image guided gastrostomy tube placement was discussed with the patient including, but not limited to the need for a barium enema during the procedure, bleeding, infection, peritonitis and/or damage to adjacent structures.  All questions were answered,and there is agreement to proceed.  Consent signed and in chart.     Thank you for allowing our service to participate in Ricardo [name] 's care.  Electronically Signed: Carlin DELENA Griffon, PA-C   11/11/2024, 10:45 AM      I spent a total of 40 Minutes in face to face in clinical consultation, greater than 50% of which was counseling/coordinating care for malfunction gastrostomy tube, with consideration for replacement.

## 2024-11-11 NOTE — Procedures (Signed)
 Interventional Radiology Procedure Note  Procedure: Placement of percutaneous 50F balloon-retention gastrostomy tube.  Complications: None  EBL:  < 5 mL  Recommendations: - NPO for 4 hours after placement - Maintain G-tube to low wall suction for 4 hours after placement - May advance diet as tolerated and begin using tube 4 hours after placement - Gastropexy sutures are absorbable and do not require removal  Marliss Coots, MD Pager: [phone]

## 2024-11-11 NOTE — Progress Notes (Signed)
 Respiratory Progress Note Room #: 5E24  Admit Date: 10/01/2024  Pulmonologist:  Discharge Date:  DX/HX:  Isolation:  Code Status: Limited resuscitation   Airway Type? (Trach/ETT):  Size:  Cuffed/Cuffless?:  Placement at lip?:  High Risk Airway?:  Date of insertion:  Trach changed:  PMV started:  Cap started:  Decannulated/extubated:   Ventilator ID Number: Current ventilator settings:  Current weaning orders:  Date of first wean:  Date of liberation from vent:   Non-invasive Ventilation Current settings:   Home use? Yes/no:  Frequency? HS/PRN/Continuous:   Oxygen Therapy Modality? Nasal Cannula/NRB/Trach Collar/etc.: cannula  FiO2:  LPM: room air to 0.5lpm [place]   Treatments Drug: Dose: Frequency: Other:                        RT Progress Date Time Important Event or Update RT Initials  10/07/2024 1726 uses room air up to 2lpm/ desaturates with sleep. CMG  10/08/24 0547 0.5/lpm [place] SAP  10/08/2024 1659 RA NP  10/09/24 0014 RCVD pt on RA, placed pt on 2L [place] to maintain saturations greater than 92% NP  10/09/2024 1209 RA-2L [place] NP  10/10/24 0531 2/lpm [place] SAP  10/10/2024 1820 1L [place] CMG  10/11/24 0606 2/lpm [place] SAP  10/11/2024 1844 RA JM  10/12/2024 0438 Placed on 2L [place] @2303  hrs for the night. OO  10/12/2024 1540 RA JM  10/13/2024 0455 2 LNC OO  10/13/2024 1817 1 LNC JM  10/14/2024 0347 1 LNC. OO  10/14/2024 0832 1 LNC BP  10/15/24 0221 1/lpm [place] SAP  10/15/2024 0846 RA BP  10/20/2024 0812 0.5L [place], oral care done BP  10/21/2024 0244 0.5L [place], tolerating well. OO  10/21/2024 0845 1L [place] BP  10/22/2024 0748 0.5L [place] BP  10/23/2024 1718 room air today spo2 94-96% CMG  10/24/2024 1735 RA CD  10/25/2024 2338 RA OO  10/26/2024 1740 Started out on [place] 1L, then RA rest of the day, no distress CD  10/27/2024 0430 Placed on 2 LNC @0338  hrs.due to SAT in the low 80's. OO  10/27/2024 0846 1L [place] BP  10/28/2024 0439 1 LNC OO  10/28/2024 0607 0.5L [place] BP  10/29/2024 1856 0.5L [place]  CMG  10/30/2024 1533  room air CMG  11/14'/2025 1559  2 LNC, room air later in day CMG  11/01/2024 1740 RA CD  11/02/2024 0523 RA KN  11/02/2024 1700 RA HL  11/03/2024 0244 RA KN  11/03/2024 0823 RA BP  11/04/2024 0111 RA DS  11/04/2024 0624 RA BP  11/05/2024 0810 RA BP  11/05/2024 2344 RA DS  11/07/2024 0806 RA HL  11/08/2024 0520 RA KN  11/08/2024 1521 On 1lpm for part of the day, RA currently CMG  11/09/2024 0544 RA KN  11/09/2024 1741 RA CMG  11/10/2024 0547 RA   11/11/2024 0102 RA

## 2024-11-12 LAB — RENAL FUNCTION PANEL
Albumin: 1.5 g/dL — ABNORMAL LOW (ref 3.5–5.0)
Anion gap: 9 (ref 5–15)
BUN: 42 mg/dL — ABNORMAL HIGH (ref 8–23)
CO2: 26 mmol/L (ref 22–32)
Calcium: 8.6 mg/dL — ABNORMAL LOW (ref 8.9–10.3)
Chloride: 103 mmol/L (ref 98–111)
Creatinine, Ser: 2.13 mg/dL — ABNORMAL HIGH (ref 0.61–1.24)
GFR, Estimated: 32 mL/min — ABNORMAL LOW (ref >60)
Glucose, Bld: 95 mg/dL (ref 70–99)
Phosphorus: 4.3 mg/dL (ref 2.5–4.6)
Potassium: 4 mmol/L (ref 3.5–5.1)
Sodium: 138 mmol/L (ref 135–145)

## 2024-11-12 LAB — CBC WITH DIFFERENTIAL/PLATELET
Abs Immature Granulocytes: 0.08 K/uL — ABNORMAL HIGH (ref 0.00–0.07)
Basophils Absolute: 0.1 K/uL (ref 0.0–0.1)
Basophils Relative: 2 %
Eosinophils Absolute: 0.1 K/uL (ref 0.0–0.5)
Eosinophils Relative: 2 %
HCT: 20.7 % — ABNORMAL LOW (ref 39.0–52.0)
Hemoglobin: 6.6 g/dL — CL (ref 13.0–17.0)
Immature Granulocytes: 1 %
Lymphocytes Relative: 6 %
Lymphs Abs: 0.5 K/uL — ABNORMAL LOW (ref 0.7–4.0)
MCH: 29.7 pg (ref 26.0–34.0)
MCHC: 31.9 g/dL (ref 30.0–36.0)
MCV: 93.2 fL (ref 80.0–100.0)
Monocytes Absolute: 0.8 K/uL (ref 0.1–1.0)
Monocytes Relative: 10 %
Neutro Abs: 7.1 K/uL (ref 1.7–7.7)
Neutrophils Relative %: 79 %
Platelets: 124 K/uL — ABNORMAL LOW (ref 150–400)
RBC: 2.22 MIL/uL — ABNORMAL LOW (ref 4.22–5.81)
RDW: 17 % — ABNORMAL HIGH (ref 11.5–15.5)
WBC: 8.8 K/uL (ref 4.0–10.5)
nRBC: 0 % (ref 0.0–0.2)

## 2024-11-12 LAB — PREPARE RBC (CROSSMATCH)

## 2024-11-12 NOTE — Progress Notes (Signed)
 Referring Physician(s): Dr. Selinda Fleeta Finger, MD   Supervising Physician: Philip Cornet  Patient Status:  [facility] - In-pt  Chief Complaint:  S/p g tube insertion 11/25  Subjective:  IP team started tube feedings last night which flowed well. These were stopped when new bleeding around the tube was noted this AM. No issues with flow.   IR called to evaluate bleeding.   Allergies: Patient has no known allergies.  Medications: Prior to Admission medications   Medication Sig Start Date End Date Taking? Authorizing Provider  allopurinol  (ZYLOPRIM ) 300 MG tablet Take 300 mg by mouth at bedtime. 10/12/22   [provider]  aspirin  EC 81 MG tablet Take 1 tablet (81 mg total) by mouth daily with breakfast. Swallow whole. 07/10/24   Pearlean Manus, MD  calcium -vitamin D (OSCAL WITH D) 500-200 MG-UNIT tablet Take 1 tablet by mouth.    [provider]  cyanocobalamin  (VITAMIN B12) 500 MCG tablet Take 2 tablets by mouth daily. 07/26/23   [provider]  fluticasone (FLONASE) 50 MCG/ACT nasal spray Place 2 sprays into both nostrils daily. 07/26/23   [provider]  latanoprost (XALATAN) 0.005 % ophthalmic solution Apply 1 drop to eye at bedtime.    [provider]  loratadine (CLARITIN) 10 MG tablet Take 10 mg by mouth daily.    [provider]  MELATONIN PO Take 1 mg by mouth at bedtime.    [provider]  methocarbamol (ROBAXIN) 500 MG tablet Take 500 mg by mouth in the morning and at bedtime.    [provider]  mirtazapine  (REMERON ) 15 MG tablet Take 15 mg by mouth at bedtime.    [provider]  Multiple Vitamin (MULTIVITAMIN) capsule Take by mouth.    [provider]  pantoprazole  (PROTONIX ) 40 MG tablet Take 40 mg by mouth daily as needed (heartburn, acid reflux).    [provider]  polyethylene glycol (MIRALAX  / GLYCOLAX ) 17 g packet Take 17 g by mouth daily as needed for mild constipation.     [provider]  sertraline  (ZOLOFT ) 50 MG tablet Take 50 mg by mouth daily.    [provider]     Vital Signs: BP 137/71   Pulse 91   Resp 11   SpO2 100%   Physical Exam Pulmonary:     Effort: Pulmonary effort is normal.  Abdominal:     General: There is no distension.     Palpations: Abdomen is soft.     Tenderness: There is abdominal tenderness.     Comments: Tender around site of G tube. Wound from previous g tube with yellow/green drainage on dressing. 6 cm diameter dried blood on dressing which has needed to be changed twice today.   Neurological:     Mental Status: He is alert. Mental status is at baseline.     Imaging: IR GASTROSTOMY TUBE MOD SED Result Date: 11/11/2024 INDICATION: 73 year old male with history of prior percutaneous gastrostomy tube placement outside facility presenting with dislodgement of tube to the subcutaneous anterior abdominal wall. Request for new gastrostomy access. EXAM: PERC PLACEMENT GASTROSTOMY MEDICATIONS: Ancef  2 gm IV; Antibiotics were administered within 1 hour of the procedure. ANESTHESIA/SEDATION: Versed  2 mg IV; Fentanyl  100 mcg IV Moderate Sedation Time:  12 The patient was continuously monitored during the procedure by the interventional radiology nurse under my direct supervision. CONTRAST:  15mL OMNIPAQUE  IOHEXOL  300 MG/ML SOLN - administered into the gastric lumen. FLUOROSCOPY TIME:  Four mGy reference  air kerma COMPLICATIONS: None immediate. PROCEDURE: Informed written consent was obtained from the patient after a thorough discussion of the procedural risks, benefits and alternatives. All questions were addressed. Maximal Sterile barrier Technique was utilized including caps, mask, sterile gowns, sterile gloves, sterile drape, hand hygiene and skin antiseptic. A timeout was performed prior to the initiation of the procedure. The patient was placed on the procedure table in the supine position. Pre-procedure abdominal  film confirmed visualization of the transverse colon. An angled 5-French catheter was passed through the nares into the stomach. The patient was prepped and draped in usual sterile fashion. The stomach was insufflated with air via the indwelling nasogastric tube. Under fluoroscopy, a puncture site was selected and local analgesia achieved with 1% lidocaine  infiltrated subcutaneously. Under fluoroscopic guidance, a gastropexy needle was passed into the stomach and the T-bar suture was released. Entry into the stomach was confirmed with fluoroscopy, aspiration of air, and injection of contrast material. This was repeated with an additional gastropexy suture (for a total of 2 fasteners). At the center of these gastropexy sutures, a dermatotomy was performed. An 18 gauge needle was passed into the stomach at the site of this dermatotomy, and position within the gastric lumen again confirmed under fluoroscopy using aspiration of air and contrast injection. An Amplatz guidewire was passed through this needle and intraluminal placement within the stomach was confirmed by fluoroscopy. The needle was removed. Over the guidewire, the percutaneous tract was dilated using a 10 mm non-compliant balloon. The balloon was deflated, then pushed into the gastric lumen followed in concert by the 20 Fr gastrostomy tube. The retention balloon of the percutaneous gastrostomy tube was inflated with 10 mL of sterile water . The tube was withdrawn until the retention balloon was at the edge of the gastric lumen. The external bumper was brought to the abdominal wall. Contrast was injected through the gastrostomy tube, confirming intraluminal positioning. The malpositioned indwelling gastrostomy was removed without complication. The patient tolerated the procedure well without any immediate post-procedural complications. IMPRESSION: Technically successful placement of 20 Fr gastrostomy tube. Ester Sides, MD [facility] Radiology Electronically Signed   By: Ester Sides M.D.   On: 11/11/2024 18:10   CT ABDOMEN WO CONTRAST Result Date: 11/11/2024 EXAM: CT ABDOMEN WITHOUT CONTRAST 11/11/2024 02:21:08 AM TECHNIQUE: CT of the abdomen was performed without the administration of intravenous contrast. Multiplanar reformatted images are provided for review. Automated exposure control, iterative reconstruction, and/or weight based adjustment of the mA/kV was utilized to reduce the radiation dose to as low as reasonably achievable. COMPARISON: 07/08/2024 CLINICAL HISTORY: preoperative evaluation for IR gtube placement FINDINGS: LOWER CHEST: Moderate bilateral pleural effusions with compressive atelectasis in the lower lobes. HEPATOBILIARY: The visualized liver is unremarkable. Gallbladder is unremarkable. SPLEEN: Splenomegaly with a craniocaudal length of 16.5 cm. Low-density lesion in the inferior spleen measures 2.5 x 1.7 cm compared to 4.2 x 3.8 cm previously. PANCREAS: Pancreas demonstrates no acute abnormality. ADRENAL GLANDS: Adrenal glands demonstrate no acute abnormality. KIDNEYS: Left lower pole renal cyst measures 4.3 cm, stable. Per consensus, no follow-up is needed for simple Bosniak type 1 and 2 renal cysts, unless the patient has a malignancy history or risk factors. No stones in the kidneys or proximal ureters. No hydronephrosis. No perinephric or periureteral stranding. GI AND BOWEL: Gastrostomy tube in place, which appears to be pulled out of the stomach, with the retention button in the anterior abdominal wall. Stomach and duodenal sweep demonstrate no acute abnormality. There  is no bowel obstruction. No abnormal bowel wall thickening or distension. PERITONEUM AND RETROPERITONEUM: No ascites or free air. Aorta is normal in caliber. LYMPH NODES: No lymphadenopathy. BONES AND SOFT TISSUES: Right iliopsoas muscle hematoma measuring 5.7 x 3.8 cm on image 66 of series 3. No acute abnormality  of the visualized bones. IMPRESSION: 1. Gastrostomy tube in place, which appears to be pulled out of the stomach, with the retention button in the anterior abdominal wall. 2. Right iliopsoas muscle hematoma measuring 5.7 x 3.8 cm. 3. Moderate bilateral pleural effusions with compressive atelectasis in the lower lobes. 4. Splenomegaly with a low-density inferior splenic lesion decreased in size to 2.5 x 1.7 cm from 4.2 x 3.8 cm previously. Electronically signed by: Franky Crease MD 11/11/2024 02:32 AM EST RP Workstation: [id number]    Labs:  CBC: Recent Labs    11/07/24 0344 11/07/24 2117 11/08/24 0055 11/10/24 0322 11/12/24 0330  WBC 9.1  --  14.5* 10.5 8.8  HGB 6.5* 8.9* 8.0* 7.5* 6.6*  HCT 20.7* 27.1* 25.0* 23.6* 20.7*  PLT 130*  --  134* 121* 124*    COAGS: Recent Labs    07/03/24 1253 07/04/24 0401 11/11/24 1224  INR 1.3*  --  1.5*  APTT  --  41*  --     BMP: Recent Labs    11/05/24 0404 11/07/24 0344 11/10/24 0322 11/12/24 0330  NA 136 133* 136 138  K 4.4 4.2 4.3 4.0  CL 97* 97* 100 103  CO2 25 29 26 26   GLUCOSE 104* 110* 87 95  BUN 67* 65* 57* 42*  CALCIUM  8.4* 8.4* 8.6* 8.6*  CREATININE 2.65* 2.41* 2.57* 2.13*  GFRNONAA 25* 28* 26* 32*    LIVER FUNCTION TESTS: Recent Labs    07/04/24 0401 07/07/24 0406 07/09/24 0413 10/02/24 0411 10/03/24 0343 11/05/24 0404 11/07/24 0344 11/10/24 0322 11/12/24 0330  BILITOT 2.5* 1.4* 1.4* 0.7  --   --   --   --   --   AST 39 41 45* 21  --   --   --   --   --   ALT 16 21 21 9   --   --   --   --   --   ALKPHOS 77 89 92 187*  --   --   --   --   --   PROT 6.3* 6.1* 6.6 6.2*  --   --   --   --   --   ALBUMIN 3.3* 3.2* 3.5 1.6*   < > <1.5* <1.5* 1.5* 1.5*   < > = values in this interval not displayed.    Assessment and Plan:  Patient is receiving transfusion but has required this previously due to chronic conditions. Unlikely related to g tube site. Expected continued leakage from old g tube site during healing  over next week. No active bleeding from g tube, though blood seen is more than typical. Will keep on rounds list to recheck before end of week, but approved to begin TF again. Stop and call IR again if saturating  dressing >1x per hr or trouble with flow of feedings. No concern for malposition at this time to prompt new imaging.   Electronically Signed: Laymon Coast, NP 11/12/2024, 6:11 PM   I spent a total of 15 Minutes at the the patient's bedside AND on the patient's hospital floor or unit, greater than 50% of which was counseling/coordinating care for s/p g tube placement 11/25 by Dr. Jennefer.

## 2024-11-13 LAB — TYPE AND SCREEN
ABO/RH(D): O NEG
Antibody Screen: NEGATIVE
Unit division: 0

## 2024-11-13 LAB — BPAM RBC
Blood Product Expiration Date: 202512112359
ISSUE DATE / TIME: 202511261355
Unit Type and Rh: 9500

## 2024-11-13 LAB — HEMOGLOBIN AND HEMATOCRIT, BLOOD
HCT: 23.7 % — ABNORMAL LOW (ref 39.0–52.0)
Hemoglobin: 7.5 g/dL — ABNORMAL LOW (ref 13.0–17.0)

## 2024-11-14 LAB — CBC WITH DIFFERENTIAL/PLATELET
Abs Immature Granulocytes: 0.06 K/uL (ref 0.00–0.07)
Basophils Absolute: 0.1 K/uL (ref 0.0–0.1)
Basophils Relative: 1 %
Eosinophils Absolute: 0.2 K/uL (ref 0.0–0.5)
Eosinophils Relative: 2 %
HCT: 22.6 % — ABNORMAL LOW (ref 39.0–52.0)
Hemoglobin: 7.3 g/dL — ABNORMAL LOW (ref 13.0–17.0)
Immature Granulocytes: 1 %
Lymphocytes Relative: 7 %
Lymphs Abs: 0.6 K/uL — ABNORMAL LOW (ref 0.7–4.0)
MCH: 29.8 pg (ref 26.0–34.0)
MCHC: 32.3 g/dL (ref 30.0–36.0)
MCV: 92.2 fL (ref 80.0–100.0)
Monocytes Absolute: 0.5 K/uL (ref 0.1–1.0)
Monocytes Relative: 7 %
Neutro Abs: 6.8 K/uL (ref 1.7–7.7)
Neutrophils Relative %: 82 %
Platelets: 135 K/uL — ABNORMAL LOW (ref 150–400)
RBC: 2.45 MIL/uL — ABNORMAL LOW (ref 4.22–5.81)
RDW: 17.5 % — ABNORMAL HIGH (ref 11.5–15.5)
WBC: 8.3 K/uL (ref 4.0–10.5)
nRBC: 0 % (ref 0.0–0.2)

## 2024-11-14 LAB — RENAL FUNCTION PANEL
Albumin: 1.5 g/dL — ABNORMAL LOW (ref 3.5–5.0)
Anion gap: 8 (ref 5–15)
BUN: 44 mg/dL — ABNORMAL HIGH (ref 8–23)
CO2: 28 mmol/L (ref 22–32)
Calcium: 8.6 mg/dL — ABNORMAL LOW (ref 8.9–10.3)
Chloride: 104 mmol/L (ref 98–111)
Creatinine, Ser: 1.96 mg/dL — ABNORMAL HIGH (ref 0.61–1.24)
GFR, Estimated: 36 mL/min — ABNORMAL LOW (ref >60)
Glucose, Bld: 116 mg/dL — ABNORMAL HIGH (ref 70–99)
Phosphorus: 2.1 mg/dL — ABNORMAL LOW (ref 2.5–4.6)
Potassium: 3.9 mmol/L (ref 3.5–5.1)
Sodium: 140 mmol/L (ref 135–145)

## 2024-11-14 NOTE — Progress Notes (Signed)
 Ricardo [name] is a 73 y.o. male s/p image guided gastrostomy tube placement with IR on 11/25. It was noted that patient had bleeding around the G tube insertion site on 11/26. Patient seen today for site check.   G tube site appears clean, dry, no active bleeding, no sign of acute infection. Old, dry blood on the dressing.   Has T Fastener which is designed to fall off spontaneously. IR APP will re-check in 10 days and remove if T Fastener remains.   Please call IR for questions and concerns regarding the G tube.    Kean Gautreau H Orel Cooler PA-C 11/14/2024 10:41 AM

## 2024-11-17 LAB — CBC WITH DIFFERENTIAL/PLATELET
Basophils Absolute: 0.2 K/uL — ABNORMAL HIGH (ref 0.0–0.1)
Basophils Relative: 2 %
Eosinophils Absolute: 0.3 K/uL (ref 0.0–0.5)
Eosinophils Relative: 3 %
HCT: 21.5 % — ABNORMAL LOW (ref 39.0–52.0)
Hemoglobin: 6.6 g/dL — CL (ref 13.0–17.0)
Lymphocytes Relative: 4 %
Lymphs Abs: 0.4 K/uL — ABNORMAL LOW (ref 0.7–4.0)
MCH: 28.9 pg (ref 26.0–34.0)
MCHC: 30.7 g/dL (ref 30.0–36.0)
MCV: 94.3 fL (ref 80.0–100.0)
Monocytes Absolute: 0.2 K/uL (ref 0.1–1.0)
Monocytes Relative: 2 %
Neutro Abs: 9.2 K/uL — ABNORMAL HIGH (ref 1.7–7.7)
Neutrophils Relative %: 89 %
Platelets: 164 K/uL (ref 150–400)
RBC: 2.28 MIL/uL — ABNORMAL LOW (ref 4.22–5.81)
RDW: 17.3 % — ABNORMAL HIGH (ref 11.5–15.5)
WBC: 10.3 K/uL (ref 4.0–10.5)
nRBC: 0 % (ref 0.0–0.2)

## 2024-11-17 LAB — RENAL FUNCTION PANEL
Albumin: 1.6 g/dL — ABNORMAL LOW (ref 3.5–5.0)
Anion gap: 8 (ref 5–15)
BUN: 76 mg/dL — ABNORMAL HIGH (ref 8–23)
CO2: 29 mmol/L (ref 22–32)
Calcium: 8.9 mg/dL (ref 8.9–10.3)
Chloride: 101 mmol/L (ref 98–111)
Creatinine, Ser: 2.28 mg/dL — ABNORMAL HIGH (ref 0.61–1.24)
GFR, Estimated: 30 mL/min — ABNORMAL LOW (ref >60)
Glucose, Bld: 94 mg/dL (ref 70–99)
Phosphorus: 1.7 mg/dL — ABNORMAL LOW (ref 2.5–4.6)
Potassium: 4.3 mmol/L (ref 3.5–5.1)
Sodium: 138 mmol/L (ref 135–145)

## 2024-11-17 LAB — PREPARE RBC (CROSSMATCH)

## 2024-11-18 ENCOUNTER — Institutional Professional Consult (permissible substitution) (HOSPITAL_COMMUNITY)

## 2024-11-18 LAB — HEMOGLOBIN AND HEMATOCRIT, BLOOD
HCT: 23.3 % — ABNORMAL LOW (ref 39.0–52.0)
Hemoglobin: 7.4 g/dL — ABNORMAL LOW (ref 13.0–17.0)

## 2024-11-19 LAB — CBC WITH DIFFERENTIAL/PLATELET
Abs Immature Granulocytes: 0.12 K/uL — ABNORMAL HIGH (ref 0.00–0.07)
Basophils Absolute: 0.2 K/uL — ABNORMAL HIGH (ref 0.0–0.1)
Basophils Relative: 2 %
Eosinophils Absolute: 0.2 K/uL (ref 0.0–0.5)
Eosinophils Relative: 2 %
HCT: 21.5 % — ABNORMAL LOW (ref 39.0–52.0)
Hemoglobin: 6.8 g/dL — CL (ref 13.0–17.0)
Immature Granulocytes: 1 %
Lymphocytes Relative: 8 %
Lymphs Abs: 0.9 K/uL (ref 0.7–4.0)
MCH: 28.9 pg (ref 26.0–34.0)
MCHC: 31.6 g/dL (ref 30.0–36.0)
MCV: 91.5 fL (ref 80.0–100.0)
Monocytes Absolute: 0.7 K/uL (ref 0.1–1.0)
Monocytes Relative: 7 %
Neutro Abs: 8.8 K/uL — ABNORMAL HIGH (ref 1.7–7.7)
Neutrophils Relative %: 80 %
Platelets: 219 K/uL (ref 150–400)
RBC: 2.35 MIL/uL — ABNORMAL LOW (ref 4.22–5.81)
RDW: 17.1 % — ABNORMAL HIGH (ref 11.5–15.5)
Smear Review: NORMAL
WBC: 11 K/uL — ABNORMAL HIGH (ref 4.0–10.5)
nRBC: 0 % (ref 0.0–0.2)

## 2024-11-19 LAB — RENAL FUNCTION PANEL
Albumin: 1.6 g/dL — ABNORMAL LOW (ref 3.5–5.0)
Anion gap: 9 (ref 5–15)
BUN: 85 mg/dL — ABNORMAL HIGH (ref 8–23)
CO2: 31 mmol/L (ref 22–32)
Calcium: 8.9 mg/dL (ref 8.9–10.3)
Chloride: 97 mmol/L — ABNORMAL LOW (ref 98–111)
Creatinine, Ser: 2.22 mg/dL — ABNORMAL HIGH (ref 0.61–1.24)
GFR, Estimated: 31 mL/min — ABNORMAL LOW (ref >60)
Glucose, Bld: 121 mg/dL — ABNORMAL HIGH (ref 70–99)
Phosphorus: 1.3 mg/dL — ABNORMAL LOW (ref 2.5–4.6)
Potassium: 3.9 mmol/L (ref 3.5–5.1)
Sodium: 137 mmol/L (ref 135–145)

## 2024-11-19 LAB — PREPARE RBC (CROSSMATCH)

## 2024-11-20 LAB — BPAM RBC
Blood Product Expiration Date: 202512082359
Blood Product Expiration Date: 202512202359
ISSUE DATE / TIME: 202512030928
ISSUE DATE / TIME: 202512010838
Unit Type and Rh: 9500
Unit Type and Rh: 9500

## 2024-11-20 LAB — TYPE AND SCREEN
ABO/RH(D): O NEG
Antibody Screen: NEGATIVE
Unit division: 0
Unit division: 0

## 2024-11-20 LAB — CBC WITH DIFFERENTIAL/PLATELET
Abs Immature Granulocytes: 0.16 K/uL — ABNORMAL HIGH (ref 0.00–0.07)
Basophils Absolute: 0.2 K/uL — ABNORMAL HIGH (ref 0.0–0.1)
Basophils Relative: 2 %
Eosinophils Absolute: 0.2 K/uL (ref 0.0–0.5)
Eosinophils Relative: 2 %
HCT: 22.6 % — ABNORMAL LOW (ref 39.0–52.0)
Hemoglobin: 7.4 g/dL — ABNORMAL LOW (ref 13.0–17.0)
Immature Granulocytes: 2 %
Lymphocytes Relative: 8 %
Lymphs Abs: 0.9 K/uL (ref 0.7–4.0)
MCH: 29.4 pg (ref 26.0–34.0)
MCHC: 32.7 g/dL (ref 30.0–36.0)
MCV: 89.7 fL (ref 80.0–100.0)
Monocytes Absolute: 0.8 K/uL (ref 0.1–1.0)
Monocytes Relative: 7 %
Neutro Abs: 8.8 K/uL — ABNORMAL HIGH (ref 1.7–7.7)
Neutrophils Relative %: 79 %
Platelets: 253 K/uL (ref 150–400)
RBC: 2.52 MIL/uL — ABNORMAL LOW (ref 4.22–5.81)
RDW: 17 % — ABNORMAL HIGH (ref 11.5–15.5)
WBC: 11 K/uL — ABNORMAL HIGH (ref 4.0–10.5)
nRBC: 0.3 % — ABNORMAL HIGH (ref 0.0–0.2)

## 2024-11-20 LAB — HEPATITIS B CORE ANTIBODY, TOTAL: HEP B CORE AB: NEGATIVE

## 2024-11-21 LAB — CBC WITH DIFFERENTIAL/PLATELET
Abs Immature Granulocytes: 0.23 K/uL — ABNORMAL HIGH (ref 0.00–0.07)
Basophils Absolute: 0.2 K/uL — ABNORMAL HIGH (ref 0.0–0.1)
Basophils Relative: 2 %
Eosinophils Absolute: 0.2 K/uL (ref 0.0–0.5)
Eosinophils Relative: 2 %
HCT: 23.2 % — ABNORMAL LOW (ref 39.0–52.0)
Hemoglobin: 7.6 g/dL — ABNORMAL LOW (ref 13.0–17.0)
Immature Granulocytes: 2 %
Lymphocytes Relative: 8 %
Lymphs Abs: 0.9 K/uL (ref 0.7–4.0)
MCH: 29.6 pg (ref 26.0–34.0)
MCHC: 32.8 g/dL (ref 30.0–36.0)
MCV: 90.3 fL (ref 80.0–100.0)
Monocytes Absolute: 0.8 K/uL (ref 0.1–1.0)
Monocytes Relative: 7 %
Neutro Abs: 8.8 K/uL — ABNORMAL HIGH (ref 1.7–7.7)
Neutrophils Relative %: 79 %
Platelets: 274 K/uL (ref 150–400)
RBC: 2.57 MIL/uL — ABNORMAL LOW (ref 4.22–5.81)
RDW: 16.8 % — ABNORMAL HIGH (ref 11.5–15.5)
WBC: 11.1 K/uL — ABNORMAL HIGH (ref 4.0–10.5)
nRBC: 0.3 % — ABNORMAL HIGH (ref 0.0–0.2)

## 2024-11-21 LAB — RENAL FUNCTION PANEL
Albumin: 1.5 g/dL — ABNORMAL LOW (ref 3.5–5.0)
Anion gap: 12 (ref 5–15)
BUN: 76 mg/dL — ABNORMAL HIGH (ref 8–23)
CO2: 29 mmol/L (ref 22–32)
Calcium: 8.9 mg/dL (ref 8.9–10.3)
Chloride: 95 mmol/L — ABNORMAL LOW (ref 98–111)
Creatinine, Ser: 2.03 mg/dL — ABNORMAL HIGH (ref 0.61–1.24)
GFR, Estimated: 34 mL/min — ABNORMAL LOW (ref >60)
Glucose, Bld: 113 mg/dL — ABNORMAL HIGH (ref 70–99)
Phosphorus: 2.2 mg/dL — ABNORMAL LOW (ref 2.5–4.6)
Potassium: 3.7 mmol/L (ref 3.5–5.1)
Sodium: 136 mmol/L (ref 135–145)

## 2024-11-21 NOTE — Progress Notes (Signed)
  Ricardo [name] is a 73 y.o. male s/p image guided gastrostomy tube placement with IR on 11/25. It was noted that patient had bleeding around the G tube insertion site on 11/26.     G-tube site appears clean, dry, no active bleeding, no sign of acute infection.  No continued concerns for functionality of G-tube or bleeding associated with G-tube.  Patient assessed at bedside for T-fasteners which have now been in place for this 10 days.  2 T-fasteners present on exam, area cleansed with iodine, T-fasteners removed without complication.  Patient will now be removed from the IR rounds list.  Please call IR with questions and concerns regarding the G-tube.  Brady Schiller NP 11/21/2024 4:15 PM

## 2024-11-24 LAB — RENAL FUNCTION PANEL
Albumin: 1.6 g/dL — ABNORMAL LOW (ref 3.5–5.0)
Anion gap: 9 (ref 5–15)
BUN: 111 mg/dL — ABNORMAL HIGH (ref 8–23)
CO2: 28 mmol/L (ref 22–32)
Calcium: 8.9 mg/dL (ref 8.9–10.3)
Chloride: 95 mmol/L — ABNORMAL LOW (ref 98–111)
Creatinine, Ser: 2.54 mg/dL — ABNORMAL HIGH (ref 0.61–1.24)
GFR, Estimated: 26 mL/min — ABNORMAL LOW (ref >60)
Glucose, Bld: 116 mg/dL — ABNORMAL HIGH (ref 70–99)
Phosphorus: 3 mg/dL (ref 2.5–4.6)
Potassium: 4.9 mmol/L (ref 3.5–5.1)
Sodium: 132 mmol/L — ABNORMAL LOW (ref 135–145)

## 2024-11-24 LAB — CBC WITH DIFFERENTIAL/PLATELET
Abs Immature Granulocytes: 0.19 K/uL — ABNORMAL HIGH (ref 0.00–0.07)
Basophils Absolute: 0.2 K/uL — ABNORMAL HIGH (ref 0.0–0.1)
Basophils Relative: 2 %
Eosinophils Absolute: 0.2 K/uL (ref 0.0–0.5)
Eosinophils Relative: 2 %
HCT: 21.5 % — ABNORMAL LOW (ref 39.0–52.0)
Hemoglobin: 7 g/dL — ABNORMAL LOW (ref 13.0–17.0)
Immature Granulocytes: 2 %
Lymphocytes Relative: 9 %
Lymphs Abs: 0.9 K/uL (ref 0.7–4.0)
MCH: 29.7 pg (ref 26.0–34.0)
MCHC: 32.6 g/dL (ref 30.0–36.0)
MCV: 91.1 fL (ref 80.0–100.0)
Monocytes Absolute: 0.7 K/uL (ref 0.1–1.0)
Monocytes Relative: 7 %
Neutro Abs: 8.2 K/uL — ABNORMAL HIGH (ref 1.7–7.7)
Neutrophils Relative %: 78 %
Platelets: 279 K/uL (ref 150–400)
RBC: 2.36 MIL/uL — ABNORMAL LOW (ref 4.22–5.81)
RDW: 16.2 % — ABNORMAL HIGH (ref 11.5–15.5)
WBC: 10.3 K/uL (ref 4.0–10.5)
nRBC: 0.3 % — ABNORMAL HIGH (ref 0.0–0.2)

## 2024-11-24 LAB — HEMOGLOBIN AND HEMATOCRIT, BLOOD
HCT: 26.8 % — ABNORMAL LOW (ref 39.0–52.0)
Hemoglobin: 9 g/dL — ABNORMAL LOW (ref 13.0–17.0)

## 2024-11-24 LAB — PREPARE RBC (CROSSMATCH)

## 2024-11-25 LAB — BPAM RBC
Blood Product Expiration Date: 202512122359
ISSUE DATE / TIME: 202512081301
Unit Type and Rh: 9500

## 2024-11-25 LAB — TYPE AND SCREEN
ABO/RH(D): O NEG
Antibody Screen: NEGATIVE
Unit division: 0

## 2024-11-26 LAB — RENAL FUNCTION PANEL
Albumin: 1.7 g/dL — ABNORMAL LOW (ref 3.5–5.0)
Anion gap: 8 (ref 5–15)
BUN: 88 mg/dL — ABNORMAL HIGH (ref 8–23)
CO2: 28 mmol/L (ref 22–32)
Calcium: 8.6 mg/dL — ABNORMAL LOW (ref 8.9–10.3)
Chloride: 97 mmol/L — ABNORMAL LOW (ref 98–111)
Creatinine, Ser: 2.23 mg/dL — ABNORMAL HIGH (ref 0.61–1.24)
GFR, Estimated: 31 mL/min — ABNORMAL LOW (ref >60)
Glucose, Bld: 97 mg/dL (ref 70–99)
Phosphorus: 2.8 mg/dL (ref 2.5–4.6)
Potassium: 4.3 mmol/L (ref 3.5–5.1)
Sodium: 133 mmol/L — ABNORMAL LOW (ref 135–145)

## 2024-11-26 LAB — CBC WITH DIFFERENTIAL/PLATELET
Abs Immature Granulocytes: 0.14 K/uL — ABNORMAL HIGH (ref 0.00–0.07)
Basophils Absolute: 0.2 K/uL — ABNORMAL HIGH (ref 0.0–0.1)
Basophils Relative: 2 %
Eosinophils Absolute: 0.1 K/uL (ref 0.0–0.5)
Eosinophils Relative: 2 %
HCT: 24.5 % — ABNORMAL LOW (ref 39.0–52.0)
Hemoglobin: 7.9 g/dL — ABNORMAL LOW (ref 13.0–17.0)
Immature Granulocytes: 2 %
Lymphocytes Relative: 9 %
Lymphs Abs: 0.8 K/uL (ref 0.7–4.0)
MCH: 29.4 pg (ref 26.0–34.0)
MCHC: 32.2 g/dL (ref 30.0–36.0)
MCV: 91.1 fL (ref 80.0–100.0)
Monocytes Absolute: 1 K/uL (ref 0.1–1.0)
Monocytes Relative: 11 %
Neutro Abs: 7.2 K/uL (ref 1.7–7.7)
Neutrophils Relative %: 74 %
Platelets: 210 K/uL (ref 150–400)
RBC: 2.69 MIL/uL — ABNORMAL LOW (ref 4.22–5.81)
RDW: 16.2 % — ABNORMAL HIGH (ref 11.5–15.5)
Smear Review: NORMAL
WBC: 9.5 K/uL (ref 4.0–10.5)
nRBC: 0.2 % (ref 0.0–0.2)

## 2024-11-26 LAB — HEPATITIS B SURFACE ANTIGEN: Hepatitis B Surface Ag: NONREACTIVE

## 2024-11-28 LAB — CBC WITH DIFFERENTIAL/PLATELET
Abs Immature Granulocytes: 0.13 K/uL — ABNORMAL HIGH (ref 0.00–0.07)
Basophils Absolute: 0.2 K/uL — ABNORMAL HIGH (ref 0.0–0.1)
Basophils Relative: 2 %
Eosinophils Absolute: 0.2 K/uL (ref 0.0–0.5)
Eosinophils Relative: 2 %
HCT: 23.1 % — ABNORMAL LOW (ref 39.0–52.0)
Hemoglobin: 7.4 g/dL — ABNORMAL LOW (ref 13.0–17.0)
Immature Granulocytes: 2 %
Lymphocytes Relative: 8 %
Lymphs Abs: 0.7 K/uL (ref 0.7–4.0)
MCH: 29.6 pg (ref 26.0–34.0)
MCHC: 32 g/dL (ref 30.0–36.0)
MCV: 92.4 fL (ref 80.0–100.0)
Monocytes Absolute: 0.9 K/uL (ref 0.1–1.0)
Monocytes Relative: 11 %
Neutro Abs: 6.7 K/uL (ref 1.7–7.7)
Neutrophils Relative %: 75 %
Platelets: 177 K/uL (ref 150–400)
RBC: 2.5 MIL/uL — ABNORMAL LOW (ref 4.22–5.81)
RDW: 16.4 % — ABNORMAL HIGH (ref 11.5–15.5)
Smear Review: NORMAL
WBC: 8.8 K/uL (ref 4.0–10.5)
nRBC: 0.2 % (ref 0.0–0.2)

## 2024-11-28 LAB — RENAL FUNCTION PANEL
Albumin: 1.6 g/dL — ABNORMAL LOW (ref 3.5–5.0)
Anion gap: 5 (ref 5–15)
BUN: 81 mg/dL — ABNORMAL HIGH (ref 8–23)
CO2: 30 mmol/L (ref 22–32)
Calcium: 8.6 mg/dL — ABNORMAL LOW (ref 8.9–10.3)
Chloride: 99 mmol/L (ref 98–111)
Creatinine, Ser: 2.17 mg/dL — ABNORMAL HIGH (ref 0.61–1.24)
GFR, Estimated: 32 mL/min — ABNORMAL LOW (ref >60)
Glucose, Bld: 125 mg/dL — ABNORMAL HIGH (ref 70–99)
Phosphorus: 2.7 mg/dL (ref 2.5–4.6)
Potassium: 4.3 mmol/L (ref 3.5–5.1)
Sodium: 134 mmol/L — ABNORMAL LOW (ref 135–145)

## 2024-11-30 LAB — PREPARE RBC (CROSSMATCH)

## 2024-11-30 LAB — CBC
HCT: 21.1 % — ABNORMAL LOW (ref 39.0–52.0)
Hemoglobin: 6.8 g/dL — CL (ref 13.0–17.0)
MCH: 30 pg (ref 26.0–34.0)
MCHC: 32.2 g/dL (ref 30.0–36.0)
MCV: 93 fL (ref 80.0–100.0)
Platelets: 147 K/uL — ABNORMAL LOW (ref 150–400)
RBC: 2.27 MIL/uL — ABNORMAL LOW (ref 4.22–5.81)
RDW: 16.6 % — ABNORMAL HIGH (ref 11.5–15.5)
WBC: 8.1 K/uL (ref 4.0–10.5)
nRBC: 0 % (ref 0.0–0.2)

## 2024-11-30 LAB — HEMOGLOBIN AND HEMATOCRIT, BLOOD
HCT: 26 % — ABNORMAL LOW (ref 39.0–52.0)
Hemoglobin: 8.4 g/dL — ABNORMAL LOW (ref 13.0–17.0)

## 2024-12-01 LAB — TYPE AND SCREEN
ABO/RH(D): O NEG
Antibody Screen: NEGATIVE
Unit division: 0

## 2024-12-01 LAB — CBC WITH DIFFERENTIAL/PLATELET
Abs Immature Granulocytes: 0.12 K/uL — ABNORMAL HIGH (ref 0.00–0.07)
Basophils Absolute: 0.2 K/uL — ABNORMAL HIGH (ref 0.0–0.1)
Basophils Relative: 2 %
Eosinophils Absolute: 0.2 K/uL (ref 0.0–0.5)
Eosinophils Relative: 2 %
HCT: 25 % — ABNORMAL LOW (ref 39.0–52.0)
Hemoglobin: 8.2 g/dL — ABNORMAL LOW (ref 13.0–17.0)
Immature Granulocytes: 1 %
Lymphocytes Relative: 7 %
Lymphs Abs: 0.6 K/uL — ABNORMAL LOW (ref 0.7–4.0)
MCH: 29.9 pg (ref 26.0–34.0)
MCHC: 32.8 g/dL (ref 30.0–36.0)
MCV: 91.2 fL (ref 80.0–100.0)
Monocytes Absolute: 1.1 K/uL — ABNORMAL HIGH (ref 0.1–1.0)
Monocytes Relative: 12 %
Neutro Abs: 6.9 K/uL (ref 1.7–7.7)
Neutrophils Relative %: 76 %
Platelets: 161 K/uL (ref 150–400)
RBC: 2.74 MIL/uL — ABNORMAL LOW (ref 4.22–5.81)
RDW: 16.1 % — ABNORMAL HIGH (ref 11.5–15.5)
WBC: 9.1 K/uL (ref 4.0–10.5)
nRBC: 0 % (ref 0.0–0.2)

## 2024-12-01 LAB — BPAM RBC
Blood Product Expiration Date: 202512222359
ISSUE DATE / TIME: 202512141319
Unit Type and Rh: 9500

## 2024-12-01 LAB — RENAL FUNCTION PANEL
Albumin: 1.7 g/dL — ABNORMAL LOW (ref 3.5–5.0)
Anion gap: 8 (ref 5–15)
BUN: 80 mg/dL — ABNORMAL HIGH (ref 8–23)
CO2: 28 mmol/L (ref 22–32)
Calcium: 8.6 mg/dL — ABNORMAL LOW (ref 8.9–10.3)
Chloride: 98 mmol/L (ref 98–111)
Creatinine, Ser: 2.29 mg/dL — ABNORMAL HIGH (ref 0.61–1.24)
GFR, Estimated: 30 mL/min — ABNORMAL LOW (ref >60)
Glucose, Bld: 112 mg/dL — ABNORMAL HIGH (ref 70–99)
Phosphorus: 3.3 mg/dL (ref 2.5–4.6)
Potassium: 4.9 mmol/L (ref 3.5–5.1)
Sodium: 134 mmol/L — ABNORMAL LOW (ref 135–145)

## 2024-12-02 LAB — URINALYSIS, ROUTINE W REFLEX MICROSCOPIC
Glucose, UA: NEGATIVE mg/dL
Ketones, ur: NEGATIVE mg/dL
Nitrite: POSITIVE — AB
Protein, ur: >300 mg/dL — AB
Specific Gravity, Urine: 1.02 (ref 1.005–1.030)
pH: 7.5 (ref 5.0–8.0)

## 2024-12-02 LAB — CBC WITH DIFFERENTIAL/PLATELET
Abs Immature Granulocytes: 0.15 K/uL — ABNORMAL HIGH (ref 0.00–0.07)
Basophils Absolute: 0.2 K/uL — ABNORMAL HIGH (ref 0.0–0.1)
Basophils Relative: 2 %
Eosinophils Absolute: 0.2 K/uL (ref 0.0–0.5)
Eosinophils Relative: 2 %
HCT: 24 % — ABNORMAL LOW (ref 39.0–52.0)
Hemoglobin: 7.7 g/dL — ABNORMAL LOW (ref 13.0–17.0)
Immature Granulocytes: 2 %
Lymphocytes Relative: 10 %
Lymphs Abs: 0.7 K/uL (ref 0.7–4.0)
MCH: 29.8 pg (ref 26.0–34.0)
MCHC: 32.1 g/dL (ref 30.0–36.0)
MCV: 93 fL (ref 80.0–100.0)
Monocytes Absolute: 0.9 K/uL (ref 0.1–1.0)
Monocytes Relative: 12 %
Neutro Abs: 5.7 K/uL (ref 1.7–7.7)
Neutrophils Relative %: 72 %
Platelets: 119 K/uL — ABNORMAL LOW (ref 150–400)
RBC: 2.58 MIL/uL — ABNORMAL LOW (ref 4.22–5.81)
RDW: 16 % — ABNORMAL HIGH (ref 11.5–15.5)
Smear Review: NORMAL
WBC: 7.8 K/uL (ref 4.0–10.5)
nRBC: 0 % (ref 0.0–0.2)

## 2024-12-02 LAB — RENAL FUNCTION PANEL
Albumin: 1.6 g/dL — ABNORMAL LOW (ref 3.5–5.0)
Anion gap: 6 (ref 5–15)
BUN: 108 mg/dL — ABNORMAL HIGH (ref 8–23)
CO2: 28 mmol/L (ref 22–32)
Calcium: 8.6 mg/dL — ABNORMAL LOW (ref 8.9–10.3)
Chloride: 100 mmol/L (ref 98–111)
Creatinine, Ser: 2.55 mg/dL — ABNORMAL HIGH (ref 0.61–1.24)
GFR, Estimated: 26 mL/min — ABNORMAL LOW (ref >60)
Glucose, Bld: 102 mg/dL — ABNORMAL HIGH (ref 70–99)
Phosphorus: 3.1 mg/dL (ref 2.5–4.6)
Potassium: 5 mmol/L (ref 3.5–5.1)
Sodium: 134 mmol/L — ABNORMAL LOW (ref 135–145)

## 2024-12-02 LAB — URINALYSIS, MICROSCOPIC (REFLEX)
RBC / HPF: >50 RBC/hpf (ref 0–5)
WBC, UA: >50 WBC/hpf (ref 0–5)

## 2024-12-04 LAB — CBC WITH DIFFERENTIAL/PLATELET
Abs Immature Granulocytes: 0.16 K/uL — ABNORMAL HIGH (ref 0.00–0.07)
Basophils Absolute: 0.2 K/uL — ABNORMAL HIGH (ref 0.0–0.1)
Basophils Relative: 2 %
Eosinophils Absolute: 0.2 K/uL (ref 0.0–0.5)
Eosinophils Relative: 2 %
HCT: 22.6 % — ABNORMAL LOW (ref 39.0–52.0)
Hemoglobin: 7.2 g/dL — ABNORMAL LOW (ref 13.0–17.0)
Immature Granulocytes: 2 %
Lymphocytes Relative: 7 %
Lymphs Abs: 0.7 K/uL (ref 0.7–4.0)
MCH: 29.9 pg (ref 26.0–34.0)
MCHC: 31.9 g/dL (ref 30.0–36.0)
MCV: 93.8 fL (ref 80.0–100.0)
Monocytes Absolute: 1.3 K/uL — ABNORMAL HIGH (ref 0.1–1.0)
Monocytes Relative: 13 %
Neutro Abs: 7.3 K/uL (ref 1.7–7.7)
Neutrophils Relative %: 74 %
Platelets: 134 K/uL — ABNORMAL LOW (ref 150–400)
RBC: 2.41 MIL/uL — ABNORMAL LOW (ref 4.22–5.81)
RDW: 16.1 % — ABNORMAL HIGH (ref 11.5–15.5)
Smear Review: NORMAL
WBC: 9.8 K/uL (ref 4.0–10.5)
nRBC: 0.6 % — ABNORMAL HIGH (ref 0.0–0.2)

## 2024-12-04 LAB — RENAL FUNCTION PANEL
Albumin: 2.3 g/dL — ABNORMAL LOW (ref 3.5–5.0)
Anion gap: 9 (ref 5–15)
BUN: 100 mg/dL — ABNORMAL HIGH (ref 8–23)
CO2: 27 mmol/L (ref 22–32)
Calcium: 9.4 mg/dL (ref 8.9–10.3)
Chloride: 97 mmol/L — ABNORMAL LOW (ref 98–111)
Creatinine, Ser: 2.43 mg/dL — ABNORMAL HIGH (ref 0.61–1.24)
GFR, Estimated: 28 mL/min — ABNORMAL LOW (ref >60)
Glucose, Bld: 124 mg/dL — ABNORMAL HIGH (ref 70–99)
Phosphorus: 2 mg/dL — ABNORMAL LOW (ref 2.5–4.6)
Potassium: 4.8 mmol/L (ref 3.5–5.1)
Sodium: 133 mmol/L — ABNORMAL LOW (ref 135–145)

## 2024-12-06 LAB — CBC WITH DIFFERENTIAL/PLATELET
Abs Immature Granulocytes: 0.13 K/uL — ABNORMAL HIGH (ref 0.00–0.07)
Basophils Absolute: 0.2 K/uL — ABNORMAL HIGH (ref 0.0–0.1)
Basophils Relative: 2 %
Eosinophils Absolute: 0.1 K/uL (ref 0.0–0.5)
Eosinophils Relative: 1 %
HCT: 20.7 % — ABNORMAL LOW (ref 39.0–52.0)
Hemoglobin: 6.8 g/dL — CL (ref 13.0–17.0)
Immature Granulocytes: 1 %
Lymphocytes Relative: 7 %
Lymphs Abs: 0.7 K/uL (ref 0.7–4.0)
MCH: 30.9 pg (ref 26.0–34.0)
MCHC: 32.9 g/dL (ref 30.0–36.0)
MCV: 94.1 fL (ref 80.0–100.0)
Monocytes Absolute: 1.3 K/uL — ABNORMAL HIGH (ref 0.1–1.0)
Monocytes Relative: 12 %
Neutro Abs: 8 K/uL — ABNORMAL HIGH (ref 1.7–7.7)
Neutrophils Relative %: 77 %
Platelets: 131 K/uL — ABNORMAL LOW (ref 150–400)
RBC: 2.2 MIL/uL — ABNORMAL LOW (ref 4.22–5.81)
RDW: 15.9 % — ABNORMAL HIGH (ref 11.5–15.5)
Smear Review: NORMAL
WBC: 10.5 K/uL (ref 4.0–10.5)
nRBC: 0.2 % (ref 0.0–0.2)

## 2024-12-06 LAB — RENAL FUNCTION PANEL
Albumin: 2.5 g/dL — ABNORMAL LOW (ref 3.5–5.0)
Anion gap: 10 (ref 5–15)
BUN: 93 mg/dL — ABNORMAL HIGH (ref 8–23)
CO2: 27 mmol/L (ref 22–32)
Calcium: 9.4 mg/dL (ref 8.9–10.3)
Chloride: 97 mmol/L — ABNORMAL LOW (ref 98–111)
Creatinine, Ser: 2.42 mg/dL — ABNORMAL HIGH (ref 0.61–1.24)
GFR, Estimated: 28 mL/min — ABNORMAL LOW
Glucose, Bld: 126 mg/dL — ABNORMAL HIGH (ref 70–99)
Phosphorus: 1.5 mg/dL — ABNORMAL LOW (ref 2.5–4.6)
Potassium: 4.4 mmol/L (ref 3.5–5.1)
Sodium: 135 mmol/L (ref 135–145)

## 2024-12-06 LAB — PREPARE RBC (CROSSMATCH)

## 2024-12-07 LAB — TYPE AND SCREEN
ABO/RH(D): O NEG
Antibody Screen: NEGATIVE
Unit division: 0

## 2024-12-07 LAB — BPAM RBC
Blood Product Expiration Date: 202601142359
ISSUE DATE / TIME: 202512201059
Unit Type and Rh: 9500

## 2024-12-07 LAB — HEMOGLOBIN AND HEMATOCRIT, BLOOD
HCT: 23.7 % — ABNORMAL LOW (ref 39.0–52.0)
Hemoglobin: 7.8 g/dL — ABNORMAL LOW (ref 13.0–17.0)

## 2024-12-08 ENCOUNTER — Other Ambulatory Visit (HOSPITAL_COMMUNITY)

## 2024-12-09 LAB — CBC WITH DIFFERENTIAL/PLATELET
Abs Immature Granulocytes: 0.42 K/uL — ABNORMAL HIGH (ref 0.00–0.07)
Basophils Absolute: 0.2 K/uL — ABNORMAL HIGH (ref 0.0–0.1)
Basophils Relative: 1 %
Eosinophils Absolute: 0.2 K/uL (ref 0.0–0.5)
Eosinophils Relative: 1 %
HCT: 22.2 % — ABNORMAL LOW (ref 39.0–52.0)
Hemoglobin: 7.2 g/dL — ABNORMAL LOW (ref 13.0–17.0)
Immature Granulocytes: 3 %
Lymphocytes Relative: 4 %
Lymphs Abs: 0.7 K/uL (ref 0.7–4.0)
MCH: 30.3 pg (ref 26.0–34.0)
MCHC: 32.4 g/dL (ref 30.0–36.0)
MCV: 93.3 fL (ref 80.0–100.0)
Monocytes Absolute: 1.3 K/uL — ABNORMAL HIGH (ref 0.1–1.0)
Monocytes Relative: 8 %
Neutro Abs: 12.9 K/uL — ABNORMAL HIGH (ref 1.7–7.7)
Neutrophils Relative %: 83 %
Platelets: 112 K/uL — ABNORMAL LOW (ref 150–400)
RBC: 2.38 MIL/uL — ABNORMAL LOW (ref 4.22–5.81)
RDW: 16.4 % — ABNORMAL HIGH (ref 11.5–15.5)
Smear Review: NORMAL
WBC: 15.7 K/uL — ABNORMAL HIGH (ref 4.0–10.5)
nRBC: 0 % (ref 0.0–0.2)

## 2024-12-09 LAB — RENAL FUNCTION PANEL
Albumin: 2.3 g/dL — ABNORMAL LOW (ref 3.5–5.0)
Anion gap: 11 (ref 5–15)
BUN: 112 mg/dL — ABNORMAL HIGH (ref 8–23)
CO2: 26 mmol/L (ref 22–32)
Calcium: 9.7 mg/dL (ref 8.9–10.3)
Chloride: 98 mmol/L (ref 98–111)
Creatinine, Ser: 3.03 mg/dL — ABNORMAL HIGH (ref 0.61–1.24)
GFR, Estimated: 21 mL/min — ABNORMAL LOW
Glucose, Bld: 130 mg/dL — ABNORMAL HIGH (ref 70–99)
Phosphorus: <1 mg/dL — CL (ref 2.5–4.6)
Potassium: 5.1 mmol/L (ref 3.5–5.1)
Sodium: 134 mmol/L — ABNORMAL LOW (ref 135–145)

## 2024-12-09 NOTE — Progress Notes (Signed)
 "     PATIENT INFORMATION   Patient Name: Ricardo [name]  Date of Birth: [birth year]  MRN: [id number]   Date of Admission: 10/01/2024  5:53 PM Length of Stay: 69 Length of Stay: 69   Primary Care Physician: No primary care provider on file.  Attending Physician:VAN EYK, JASON J, MD    SUBJECTIVE   Chief Complaint:  Status post septic shock and acute respiratory failure.  Interval Summary:  Ricardo [name] is a 73 y.o. male that has been admitted to [facility].  The patient was admitted to [facility] from 08/03/2024 until 10/01/2024.  The patient was initially admitted to [facility] for UTI, septic shock, acute respiratory failure an absolute neutropenia.  He was subsequently transferred to [facility] for anuric  renal failure in the setting of acute hypoxemic respiratory failure.  The patient underwent CRRT from 08/06/2024 and was transitioned to intermittent hemodialysis.  The patient's course at the outside facility was complicated by requiring high O2.  Patient was on BiPAP, high-flow nasal cannula.  The patient was intubated but self extubated on 09/01/2024.  The patient was intubated twice while at the outside facility.  The patient has stated he did not want tracheostomy placed so he changed his status to DNR/DNI.  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  While at the outside facility patient was diagnosed with ESBL bacteremia and Enterococcus faecalis peritonitis.  He was started on ertapenem, for a total of 2 weeks to end on 10/05/2024.  09/28/2024 patient underwent paracentesis with removal of 1.8 L. thirty fluid was positive for SPB ,cultures were positive for Enterococcus faecalis and patient was discharged on IV ampicillin and Rocephin  to end on 10/07/2021  Subjective:  The patient has not had any more fevers overnight.  The patient remains on room air.  He is alert and oriented times 2-3.  He  remains on PEG tube feeds at this time.  He is receiving dialysis when I walk into the room today..   OBJECTIVE   Objective  Vital Signs:  BP 106/50   Pulse 107   Temp 98.1 F (36.7 C) (Axillary)   Resp (!) 24   Ht 6' 1 (1.854 m)   Wt 131 lb 2.8 oz (59.5 kg)   SpO2 100%   BMI 17.31 kg/m    I/O last 24 Hours:  Intake/Output Summary (Last 24 hours) at 12/09/2024 1415 Last data filed at 12/09/2024 1150 Gross per 24 hour  Intake 817 ml  Output 500 ml  Net 317 ml     Physical Exam:  Elderly cachectic appearing gentleman lying in bed awake  in no obvious distress HEENT:  Normocephalic atraumatic, PERRLA EOMs intact nares  patent, oropharynx clear Neck : tracheostomy  midline, no secretions  Cardiovascular : S1-S2 no murmurs rubs or gallops Lungs:  Decreased breath sounds in the bases bilateral Abdomen: positive bowel sounds soft peg intact Extremities: no clubbing cyanosis or edema  Neuro: awake alert and talking  Labs:   His labs from 12/09/2024 shows sodium 134, potassium 5.1 with a BUN of 112 and a creatinine of 3.  His phosphorus is less than 1.  His albumin is 2.3.  His WBC is 15.7 with a hemoglobin of 7.2 and a platelet count of 112.  Xrays:  CT abd/pelvis 12/08/2024:  There is a deep wound midline inferior to the coccyx with findings concerning for diskitis/osteomyelitis assess sacrococcygeal junction.  There was no  organized fluid collection.  CXR 10/20/2024:  Mild interstitial edema with bilateral pleural effusions and bibasilar atelectasis.     ASSESSMENT AND PLAN   Ricardo [name] is a 73 y.o. male  who was admitted on 10/01/2024 with Acute kidney injury due to sepsis [N17.9, A41.9] .    Assessment/Plan:  ESBL bacteremia.  The patient has completed a course of ertapenem for his bacteremia.  Repeat blood cultures on 10/04/2024 were negative.    Enterococcus faecalis peritonitis.  The patient completed a course of IV ampicillin and Rocephin  for his  peritonitis on 10/14/2024.   We will continue to monitor his white blood cell count and fever curve.  He was noted to have some abdominal distention where he was sent to IR where he had a abdominal ultrasound done 11/18/2024 and there was no significant ascites by ultrasound.    Acute kidney injury currently on hemodialysis.  He went into acute renal failure due to septic shock with ATN.  The patient is status post right tunneled HD catheter placement on 10/28/2024.  Nephrology is currently following and we will continue on dialysis on a M/W/F schedule.   UTI/Hematuria.  The patient is now having low-grade temp on 12/06/2024 and has recurrence of his hematuria.  His UA showed a probable urinary tract infection where I ordered a repeat urine culture on 12/06/2024.  He had a UTI that was treated last month that showed ESBL Klebsiella pneumoniae.  He was started on empiric IV meropenem based on those sensitivities.  Nursing was unable to obtain a urine culture and we started him on his antibiotics  Acute metabolic encephalopathy.  He developed encephalopathy with the acute delirium at the outside hospital.  He is currently on sertraline  and trazodone  at this time.  His mental status has improved and he is now back to baseline per his daughter.  Myelofibrosis/anemia.  He has some anemia andthrombocytopenia but at baseline, he does not have leukocytosis.  We will transfuse him as needed.  They have started him on Micera for his anemia of chronic kidney disease.  He had a negative stool guaiac on 10/05/2024.  His iron studies show a low iron level.   His anemia is probably related to his myelofibrosis and chronic kidney disease.  He has required frequent transfusions during his stay here at [facility].   Hypothyroidism.  He remains on levothyroxine at this time.  Paraphimosis.  He was seen by Urology in 08/2024.  A Foley was placed and was then removed on 09/25/2024.  He currently does not have a Foley  catheter.  Atrial fibrillation/sinus tachycardia.  He seems to be rate controlled at this time.  He is on amiodarone as well as metoprolol.  His Aspirin  held due to severe anemia.  The patient has had some sinus tachycardia where we increased his metoprolol to 25 mg t.i.d..  We will continue to monitor with telemetry.  His sinus tachycardia has been intermittent and we will continue his current dosing.  We increased his amiodarone from 100 mg to 200 mg daily but he still has some sinus tachycardia.  Cardiology was consulted he is on Cardizem CD 360 mg daily and metoprolol 50 mg b.i.d. which seems to be controlling his heart rate.  We will continue to monitor him on telemetry.  Wounds.  The patient has an unstageable sacral pressure ulcer as well as a deep tissue injury to his left ear and his nose.  Wound Care is following.  We will continue with  the Tylenol  and oxycodone  PRN.  Wound Care felt that his wound looked infected on 12/08/2024 where we obtain a CT scan of his abdomen and pelvis on 12/08/2024 that showed a deep wound midline inferior to the coccyx with findings concerning for diskitis/osteomyelitis at the sacrococcygeal junction.  We have ordered a MRI of this area to confirm this.  Severe protein calorie malnutrition/dysphagia.  He is status post PEG tube placement.  He has had a PEG tube malfunction where  Interventional Radiology replaced his PEG tube on 11/11/2024.  He has had some complication of some bleeding around his PEG tube site where Interventional re-evaluated him.  He has not had any further bleeding from around his PEG tube site.  He is currently tolerating tube feeds well.  The dietitian will continue to follow along.  Foot pain.  He is probably having some neuropathy since he is complaining of pain in both feet. started on some low-dose gabapentin 100 mg at night.  Debility.  PT and OT therapies are currently following.  Prevention.  Heparin  for DVT prophylaxis held (due to  severe anemia) and omeprazole for GI prophylaxis.  Dispo.  I spoke to case management and they are currently working on a dialysis chair for discharge.  He will need skilled nursing facility placement.  His daughter was updated at bedside on 12/08/2024.     Code Status:  Limited Resuscitation   Spent a total of 35 minutes on this encounter. This includes reviewing patient's extensive history, assessment and visit with the patient as well as documentation time. Time spent also includes IDT collaboration.    SIGNATURE   Electronically signed:  VAN EYK, JASON J, MD  12/09/2024 at 2:15 PM EST      "

## 2024-12-10 ENCOUNTER — Other Ambulatory Visit (HOSPITAL_COMMUNITY)

## 2024-12-10 NOTE — Unmapped External Note (Signed)
 Case Management Reassessment Note  CM Reassessment Note:   Contact with:  Adult Child  Name(s):  Ricardo [name]  Discharge Plan Status:  Tentative  Patient Discharge Goal:  SNF/SNU  Back-Up Discharge Goal:  SNF/SNU  Acceptance of Discharge Plan:  Family/Caregiver verbalizes acceptance  Team Conference Review:  Reviewed results with patient/family, Shared responsibility to participate in the discharge planning process and Estimated DC Date relayed  Clinical Assessment and Discharge Plan of Care Discussed:  Dialysis    Expected Discharge Date: 12/19/2024   Narrative  Spoke to patient daughter, Ricardo for discharge planning updates. The discharge plan is SNF.  He has a bed offer at [facility] and we are awaiting a dialysis chair time. The patient is tolerating dialysis 3x a week. He remains alert and oriented x2.  He remains on room air. He is tolerating PEG tube feeds well. MD form dialysis center will come and evaluate patient on 12/26 at bedside. He is participating with therapy. CM will continue to monitor for discharge planning needs.     FRAZIER, LAKETHA, LPN [id number]

## 2024-12-11 LAB — CBC WITH DIFFERENTIAL/PLATELET
Abs Immature Granulocytes: 0.26 K/uL — ABNORMAL HIGH (ref 0.00–0.07)
Basophils Absolute: 0.1 K/uL (ref 0.0–0.1)
Basophils Relative: 1 %
Eosinophils Absolute: 0.3 K/uL (ref 0.0–0.5)
Eosinophils Relative: 3 %
HCT: 21.1 % — ABNORMAL LOW (ref 39.0–52.0)
Hemoglobin: 6.6 g/dL — CL (ref 13.0–17.0)
Immature Granulocytes: 2 %
Lymphocytes Relative: 5 %
Lymphs Abs: 0.6 K/uL — ABNORMAL LOW (ref 0.7–4.0)
MCH: 29.7 pg (ref 26.0–34.0)
MCHC: 31.3 g/dL (ref 30.0–36.0)
MCV: 95 fL (ref 80.0–100.0)
Monocytes Absolute: 1.1 K/uL — ABNORMAL HIGH (ref 0.1–1.0)
Monocytes Relative: 9 %
Neutro Abs: 10.2 K/uL — ABNORMAL HIGH (ref 1.7–7.7)
Neutrophils Relative %: 80 %
Platelets: 124 K/uL — ABNORMAL LOW (ref 150–400)
RBC: 2.22 MIL/uL — ABNORMAL LOW (ref 4.22–5.81)
RDW: 16.1 % — ABNORMAL HIGH (ref 11.5–15.5)
Smear Review: NORMAL
WBC: 12.6 K/uL — ABNORMAL HIGH (ref 4.0–10.5)
nRBC: 0.7 % — ABNORMAL HIGH (ref 0.0–0.2)

## 2024-12-11 LAB — RENAL FUNCTION PANEL
Albumin: 2.3 g/dL — ABNORMAL LOW (ref 3.5–5.0)
Anion gap: 14 (ref 5–15)
BUN: 101 mg/dL — ABNORMAL HIGH (ref 8–23)
CO2: 24 mmol/L (ref 22–32)
Calcium: 9.1 mg/dL (ref 8.9–10.3)
Chloride: 100 mmol/L (ref 98–111)
Creatinine, Ser: 2.8 mg/dL — ABNORMAL HIGH (ref 0.61–1.24)
GFR, Estimated: 23 mL/min — ABNORMAL LOW
Glucose, Bld: 134 mg/dL — ABNORMAL HIGH (ref 70–99)
Phosphorus: 2.3 mg/dL — ABNORMAL LOW (ref 2.5–4.6)
Potassium: 4.3 mmol/L (ref 3.5–5.1)
Sodium: 138 mmol/L (ref 135–145)

## 2024-12-11 LAB — VANCOMYCIN, TROUGH: Vancomycin Tr: 12 ug/mL — ABNORMAL LOW (ref 15–20)

## 2024-12-11 LAB — PREPARE RBC (CROSSMATCH)

## 2024-12-12 ENCOUNTER — Other Ambulatory Visit (HOSPITAL_COMMUNITY)

## 2024-12-12 LAB — TYPE AND SCREEN
ABO/RH(D): O NEG
Antibody Screen: NEGATIVE
Unit division: 0

## 2024-12-12 LAB — CBC WITH DIFFERENTIAL/PLATELET
Basophils Absolute: 1.1 K/uL — ABNORMAL HIGH (ref 0.0–0.1)
Basophils Relative: 6 %
Eosinophils Absolute: 0.2 K/uL (ref 0.0–0.5)
Eosinophils Relative: 1 %
HCT: 23.6 % — ABNORMAL LOW (ref 39.0–52.0)
Hemoglobin: 7.8 g/dL — ABNORMAL LOW (ref 13.0–17.0)
Lymphocytes Relative: 2 %
Lymphs Abs: 0.4 K/uL — ABNORMAL LOW (ref 0.7–4.0)
MCH: 30.5 pg (ref 26.0–34.0)
MCHC: 33.1 g/dL (ref 30.0–36.0)
MCV: 92.2 fL (ref 80.0–100.0)
Monocytes Absolute: 1.1 K/uL — ABNORMAL HIGH (ref 0.1–1.0)
Monocytes Relative: 6 %
Neutro Abs: 16.2 K/uL — ABNORMAL HIGH (ref 1.7–7.7)
Neutrophils Relative %: 85 %
Platelets: 121 K/uL — ABNORMAL LOW (ref 150–400)
RBC: 2.56 MIL/uL — ABNORMAL LOW (ref 4.22–5.81)
RDW: 16 % — ABNORMAL HIGH (ref 11.5–15.5)
WBC: 19.1 K/uL — ABNORMAL HIGH (ref 4.0–10.5)
nRBC: 0.2 % (ref 0.0–0.2)

## 2024-12-12 LAB — BPAM RBC
Blood Product Expiration Date: 202601012359
ISSUE DATE / TIME: 202512251150
Unit Type and Rh: 9500

## 2024-12-13 LAB — CBC WITH DIFFERENTIAL/PLATELET
Basophils Absolute: 0.6 K/uL — ABNORMAL HIGH (ref 0.0–0.1)
Basophils Relative: 4 %
Eosinophils Absolute: 0.1 K/uL (ref 0.0–0.5)
Eosinophils Relative: 1 %
HCT: 22.5 % — ABNORMAL LOW (ref 39.0–52.0)
Hemoglobin: 7.5 g/dL — ABNORMAL LOW (ref 13.0–17.0)
Lymphocytes Relative: 5 %
Lymphs Abs: 0.7 K/uL (ref 0.7–4.0)
MCH: 30.9 pg (ref 26.0–34.0)
MCHC: 33.3 g/dL (ref 30.0–36.0)
MCV: 92.6 fL (ref 80.0–100.0)
Monocytes Absolute: 1 K/uL (ref 0.1–1.0)
Monocytes Relative: 7 %
Neutro Abs: 12.4 K/uL — ABNORMAL HIGH (ref 1.7–7.7)
Neutrophils Relative %: 83 %
Platelets: 160 K/uL (ref 150–400)
RBC: 2.43 MIL/uL — ABNORMAL LOW (ref 4.22–5.81)
RDW: 15.9 % — ABNORMAL HIGH (ref 11.5–15.5)
WBC: 14.9 K/uL — ABNORMAL HIGH (ref 4.0–10.5)
nRBC: 0.3 % — ABNORMAL HIGH (ref 0.0–0.2)

## 2024-12-13 LAB — BASIC METABOLIC PANEL WITH GFR
Anion gap: 14 (ref 5–15)
BUN: 95 mg/dL — ABNORMAL HIGH (ref 8–23)
CO2: 22 mmol/L (ref 22–32)
Calcium: 8.9 mg/dL (ref 8.9–10.3)
Chloride: 102 mmol/L (ref 98–111)
Creatinine, Ser: 2.67 mg/dL — ABNORMAL HIGH (ref 0.61–1.24)
GFR, Estimated: 25 mL/min — ABNORMAL LOW
Glucose, Bld: 135 mg/dL — ABNORMAL HIGH (ref 70–99)
Potassium: 4.9 mmol/L (ref 3.5–5.1)
Sodium: 138 mmol/L (ref 135–145)

## 2024-12-13 LAB — RENAL FUNCTION PANEL
Albumin: 2.1 g/dL — ABNORMAL LOW (ref 3.5–5.0)
Anion gap: 11 (ref 5–15)
BUN: 97 mg/dL — ABNORMAL HIGH (ref 8–23)
CO2: 25 mmol/L (ref 22–32)
Calcium: 8.8 mg/dL — ABNORMAL LOW (ref 8.9–10.3)
Chloride: 101 mmol/L (ref 98–111)
Creatinine, Ser: 2.69 mg/dL — ABNORMAL HIGH (ref 0.61–1.24)
GFR, Estimated: 25 mL/min — ABNORMAL LOW
Glucose, Bld: 136 mg/dL — ABNORMAL HIGH (ref 70–99)
Phosphorus: 2.4 mg/dL — ABNORMAL LOW (ref 2.5–4.6)
Potassium: 4.8 mmol/L (ref 3.5–5.1)
Sodium: 137 mmol/L (ref 135–145)

## 2024-12-13 LAB — VANCOMYCIN, TROUGH: Vancomycin Tr: 20 ug/mL (ref 15–20)

## 2024-12-13 LAB — MAGNESIUM: Magnesium: 2.2 mg/dL (ref 1.7–2.4)

## 2024-12-15 LAB — CULTURE, BLOOD (SINGLE)
Culture: NO GROWTH
Culture: NO GROWTH
Special Requests: ADEQUATE
Special Requests: ADEQUATE

## 2024-12-16 LAB — CBC WITH DIFFERENTIAL/PLATELET
Abs Immature Granulocytes: 0.28 K/uL — ABNORMAL HIGH (ref 0.00–0.07)
Basophils Absolute: 0.2 K/uL — ABNORMAL HIGH (ref 0.0–0.1)
Basophils Relative: 2 %
Eosinophils Absolute: 0.5 K/uL (ref 0.0–0.5)
Eosinophils Relative: 4 %
HCT: 18.6 % — ABNORMAL LOW (ref 39.0–52.0)
Hemoglobin: 5.8 g/dL — CL (ref 13.0–17.0)
Immature Granulocytes: 2 %
Lymphocytes Relative: 8 %
Lymphs Abs: 1 K/uL (ref 0.7–4.0)
MCH: 31 pg (ref 26.0–34.0)
MCHC: 31.2 g/dL (ref 30.0–36.0)
MCV: 99.5 fL (ref 80.0–100.0)
Monocytes Absolute: 1.1 K/uL — ABNORMAL HIGH (ref 0.1–1.0)
Monocytes Relative: 9 %
Neutro Abs: 9 K/uL — ABNORMAL HIGH (ref 1.7–7.7)
Neutrophils Relative %: 75 %
Platelets: 146 K/uL — ABNORMAL LOW (ref 150–400)
RBC: 1.87 MIL/uL — ABNORMAL LOW (ref 4.22–5.81)
RDW: 16 % — ABNORMAL HIGH (ref 11.5–15.5)
Smear Review: NORMAL
WBC: 12 K/uL — ABNORMAL HIGH (ref 4.0–10.5)
nRBC: 0.3 % — ABNORMAL HIGH (ref 0.0–0.2)

## 2024-12-16 LAB — RENAL FUNCTION PANEL
Albumin: 2.2 g/dL — ABNORMAL LOW (ref 3.5–5.0)
Anion gap: 14 (ref 5–15)
BUN: 165 mg/dL — ABNORMAL HIGH (ref 8–23)
CO2: 21 mmol/L — ABNORMAL LOW (ref 22–32)
Calcium: 9.4 mg/dL (ref 8.9–10.3)
Chloride: 100 mmol/L (ref 98–111)
Creatinine, Ser: 4.1 mg/dL — ABNORMAL HIGH (ref 0.61–1.24)
GFR, Estimated: 15 mL/min — ABNORMAL LOW
Glucose, Bld: 110 mg/dL — ABNORMAL HIGH (ref 70–99)
Phosphorus: 3 mg/dL (ref 2.5–4.6)
Potassium: 6.4 mmol/L (ref 3.5–5.1)
Sodium: 135 mmol/L (ref 135–145)

## 2024-12-16 LAB — HEMOGLOBIN AND HEMATOCRIT, BLOOD
HCT: 19.2 % — ABNORMAL LOW (ref 39.0–52.0)
Hemoglobin: 6.1 g/dL — CL (ref 13.0–17.0)

## 2024-12-16 LAB — PREPARE RBC (CROSSMATCH)

## 2024-12-16 LAB — VANCOMYCIN, TROUGH: Vancomycin Tr: 30 ug/mL (ref 15–20)

## 2024-12-17 LAB — CBC
HCT: 19.5 % — ABNORMAL LOW (ref 39.0–52.0)
Hemoglobin: 6.4 g/dL — CL (ref 13.0–17.0)
MCH: 30 pg (ref 26.0–34.0)
MCHC: 32.8 g/dL (ref 30.0–36.0)
MCV: 91.5 fL (ref 80.0–100.0)
Platelets: 156 K/uL (ref 150–400)
RBC: 2.13 MIL/uL — ABNORMAL LOW (ref 4.22–5.81)
RDW: 16.4 % — ABNORMAL HIGH (ref 11.5–15.5)
WBC: 9.4 K/uL (ref 4.0–10.5)
nRBC: 0.3 % — ABNORMAL HIGH (ref 0.0–0.2)

## 2024-12-17 LAB — HEMOGLOBIN AND HEMATOCRIT, BLOOD
HCT: 24.2 % — ABNORMAL LOW (ref 39.0–52.0)
Hemoglobin: 8.1 g/dL — ABNORMAL LOW (ref 13.0–17.0)

## 2024-12-17 LAB — PREPARE RBC (CROSSMATCH)

## 2024-12-18 LAB — CBC WITH DIFFERENTIAL/PLATELET
Abs Immature Granulocytes: 0.33 K/uL — ABNORMAL HIGH (ref 0.00–0.07)
Basophils Absolute: 0.2 K/uL — ABNORMAL HIGH (ref 0.0–0.1)
Basophils Relative: 1 %
Eosinophils Absolute: 0.4 K/uL (ref 0.0–0.5)
Eosinophils Relative: 3 %
HCT: 22.5 % — ABNORMAL LOW (ref 39.0–52.0)
Hemoglobin: 7.4 g/dL — ABNORMAL LOW (ref 13.0–17.0)
Immature Granulocytes: 2 %
Lymphocytes Relative: 6 %
Lymphs Abs: 0.9 K/uL (ref 0.7–4.0)
MCH: 31 pg (ref 26.0–34.0)
MCHC: 32.9 g/dL (ref 30.0–36.0)
MCV: 94.1 fL (ref 80.0–100.0)
Monocytes Absolute: 0.9 K/uL (ref 0.1–1.0)
Monocytes Relative: 7 %
Neutro Abs: 10.8 K/uL — ABNORMAL HIGH (ref 1.7–7.7)
Neutrophils Relative %: 81 %
Platelets: 162 K/uL (ref 150–400)
RBC: 2.39 MIL/uL — ABNORMAL LOW (ref 4.22–5.81)
RDW: 15.4 % (ref 11.5–15.5)
Smear Review: NORMAL
WBC: 13.5 K/uL — ABNORMAL HIGH (ref 4.0–10.5)
nRBC: 0.2 % (ref 0.0–0.2)

## 2024-12-18 LAB — RENAL FUNCTION PANEL
Albumin: 2.3 g/dL — ABNORMAL LOW (ref 3.5–5.0)
Anion gap: 11 (ref 5–15)
BUN: 111 mg/dL — ABNORMAL HIGH (ref 8–23)
CO2: 27 mmol/L (ref 22–32)
Calcium: 9 mg/dL (ref 8.9–10.3)
Chloride: 99 mmol/L (ref 98–111)
Creatinine, Ser: 2.79 mg/dL — ABNORMAL HIGH (ref 0.61–1.24)
GFR, Estimated: 23 mL/min — ABNORMAL LOW
Glucose, Bld: 133 mg/dL — ABNORMAL HIGH (ref 70–99)
Phosphorus: 2.5 mg/dL (ref 2.5–4.6)
Potassium: 5 mmol/L (ref 3.5–5.1)
Sodium: 137 mmol/L (ref 135–145)

## 2024-12-18 LAB — TYPE AND SCREEN
ABO/RH(D): O NEG
Antibody Screen: NEGATIVE
Unit division: 0
Unit division: 0

## 2024-12-18 LAB — BPAM RBC
Blood Product Expiration Date: 202601052359
Blood Product Expiration Date: 202601222359
ISSUE DATE / TIME: 202512300917
ISSUE DATE / TIME: 202512311211
Unit Type and Rh: 9500
Unit Type and Rh: 9500

## 2024-12-19 ENCOUNTER — Other Ambulatory Visit: Payer: Self-pay

## 2024-12-19 ENCOUNTER — Encounter (HOSPITAL_COMMUNITY): Payer: Self-pay | Admitting: *Deleted

## 2024-12-19 ENCOUNTER — Emergency Department (HOSPITAL_COMMUNITY): Admission: EM | Admit: 2024-12-19 | Discharge: 2024-12-19 | Disposition: A | Discharge status: Home / Self Care

## 2024-12-19 ENCOUNTER — Inpatient Hospital Stay
Admission: RE | Admit: 2024-12-19 | Discharge: 2025-01-01 | Disposition: A | Discharge status: Still In Hospital | Source: Intra-hospital | Attending: Internal Medicine | Admitting: Internal Medicine

## 2024-12-19 DIAGNOSIS — S31000A Unspecified open wound of lower back and pelvis without penetration into retroperitoneum, initial encounter: Secondary | ICD-10-CM | POA: Diagnosis not present

## 2024-12-19 DIAGNOSIS — Z8546 Personal history of malignant neoplasm of prostate: Secondary | ICD-10-CM | POA: Diagnosis not present

## 2024-12-19 DIAGNOSIS — Z7982 Long term (current) use of aspirin: Secondary | ICD-10-CM | POA: Diagnosis not present

## 2024-12-19 DIAGNOSIS — R319 Hematuria, unspecified: Secondary | ICD-10-CM | POA: Insufficient documentation

## 2024-12-19 DIAGNOSIS — X58XXXA Exposure to other specified factors, initial encounter: Secondary | ICD-10-CM | POA: Insufficient documentation

## 2024-12-19 DIAGNOSIS — S3992XA Unspecified injury of lower back, initial encounter: Secondary | ICD-10-CM | POA: Diagnosis present

## 2024-12-19 LAB — CBC
HCT: 21.4 % — ABNORMAL LOW (ref 39.0–52.0)
Hemoglobin: 7.1 g/dL — ABNORMAL LOW (ref 13.0–17.0)
MCH: 30.9 pg (ref 26.0–34.0)
MCHC: 33.2 g/dL (ref 30.0–36.0)
MCV: 93 fL (ref 80.0–100.0)
Platelets: 177 K/uL (ref 150–400)
RBC: 2.3 MIL/uL — ABNORMAL LOW (ref 4.22–5.81)
RDW: 15.5 % (ref 11.5–15.5)
WBC: 18.7 K/uL — ABNORMAL HIGH (ref 4.0–10.5)
nRBC: 0.1 % (ref 0.0–0.2)

## 2024-12-19 NOTE — ED Provider Notes (Signed)
 " [place] EMERGENCY DEPARTMENT AT [facility] Provider Note   CSN: [id number] Arrival date & time: 12/19/24  1519     Patient presents with: Wound Infection and Hematuria   Ricardo [name] is a 74 y.o. male.   74 [facility] and general surgery consult.  Entirety of history is obtained from the chart.  Patient has had hematuria and gross blood coming from his penis since 12-20 despite treatment for UTI.  He has also had worsening sacral wounds that have been imaged there and showed discitis/osteomyelitis.  Patient is nonverbal and further history is limited at this time.   Hematuria       Prior to Admission medications  Medication Sig Start Date End Date Taking? Authorizing Provider  allopurinol  (ZYLOPRIM ) 300 MG tablet Take 300 mg by mouth at bedtime. 10/12/22   [provider]  aspirin  EC 81 MG tablet Take 1 tablet (81 mg total) by mouth daily with breakfast. Swallow whole. 07/10/24   Pearlean Manus, MD  calcium -vitamin D (OSCAL WITH D) 500-200 MG-UNIT tablet Take 1 tablet by mouth.    [provider]  cyanocobalamin  (VITAMIN B12) 500 MCG tablet Take 2 tablets by mouth daily. 07/26/23   [provider]  fluticasone (FLONASE) 50 MCG/ACT nasal spray Place 2 sprays into both nostrils daily. 07/26/23   [provider]  latanoprost (XALATAN) 0.005 % ophthalmic solution Apply 1 drop to eye at bedtime.    [provider]  loratadine (CLARITIN) 10 MG tablet Take 10 mg by mouth daily.    [provider]  MELATONIN PO Take 1 mg by mouth at bedtime.    [provider]  methocarbamol (ROBAXIN) 500 MG tablet Take 500 mg by mouth in the morning and at bedtime.    [provider]  mirtazapine  (REMERON ) 15 MG tablet Take 15 mg by mouth at bedtime.    [provider]  Multiple Vitamin (MULTIVITAMIN) capsule Take by mouth.    [provider]  pantoprazole   (PROTONIX ) 40 MG tablet Take 40 mg by mouth daily as needed (heartburn, acid reflux).    [provider]  polyethylene glycol (MIRALAX  / GLYCOLAX ) 17 g packet Take 17 g by mouth daily as needed for mild constipation.    [provider]  sertraline  (ZOLOFT ) 50 MG tablet Take 50 mg by mouth daily.    [provider]    Allergies: Patient has no known allergies.    Review of Systems  Reason unable to perform ROS: patient nonverbal.  All other systems reviewed and are negative.   Updated Vital Signs BP 134/63 (BP Location: Left Arm)   Pulse 75   Temp 98 F (36.7 C) (Oral)   Resp 17   Ht 6' 1 (1.854 m)   Wt 59.7 kg   SpO2 100%   BMI 17.36 kg/m   Physical Exam Vitals and nursing note reviewed.  Constitutional:      General: He is not in acute distress.    Appearance: He is well-developed. He is ill-appearing.  HENT:     Head: Normocephalic and atraumatic.  Eyes:     Conjunctiva/sclera: Conjunctivae normal.  Cardiovascular:     Rate and Rhythm: Normal rate and regular rhythm.     Heart sounds: No murmur heard. Pulmonary:     Effort: Pulmonary effort is normal. No respiratory distress.     Breath sounds: Normal breath sounds.  Abdominal:     Palpations: Abdomen  is soft.     Tenderness: There is no abdominal tenderness.  Musculoskeletal:        General: No swelling.     Cervical back: Neck supple.  Skin:    General: Skin is warm and dry.     Capillary Refill: Capillary refill takes less than 2 seconds.  Neurological:     Mental Status: He is alert.     Comments: At baseline     (all labs ordered are listed, but only abnormal results are displayed) Labs Reviewed - No data to display  EKG: None  Radiology: No results found.   Procedures   Medications Ordered in the ED - No data to display                                  Medical Decision Making   Patient symptoms look specialty for urology logic consultation for hematuria and  surgical consultation for wound on his sacrum.  He was seen by general surgery in the ER and they recommended against any OR intervention at this time as he already has eschars there and there is not much to do surgically.  They recommended continuing local wound care.  I spoke with Ricardo [name], urology on the phone and he recommends outpatient follow-up and nothing to do for right now for the hematuria.  [facility]  Problems Addressed: Hematuria, unspecified type: undiagnosed new problem with uncertain prognosis Wound of sacral region, initial encounter: chronic illness or injury  Amount and/or Complexity of Data Reviewed External Data Reviewed: notes.    Details: Subspecialty notes were reviewed and patient with hematuria and worsening wound since 12-06-2024 Discussion of management or test interpretation with external provider(s): Ricardo [name], Ricardo [name] for general surgery and Ricardo [name] evaluated the patient at bedside and recommended no surgical intervention at this time, continue local wound care  Ricardo [name] - urology - I spoke with him on the phone regarding the hematuria and he recommended outpatient follow up, nothing acute to do at this time   Risk OTC drugs. Prescription drug management. Diagnosis or treatment significantly limited by social determinants of health.     Final diagnoses:  Wound of sacral region, initial encounter  Hematuria, unspecified type    ED Discharge Orders     None          Ricardo Duwaine CROME, DO 12/19/24 2250  "

## 2024-12-19 NOTE — Consult Note (Signed)
 Reason for Consult:sacral wound Referring Physician: [name] is an 73 y.o. male.  HPI: 74yo M is currently a patient in [facility]. He was sent to the ED at [facility] for evaluation of a sacral wound and for hematuria. We (General Surgery) were asked to evaluate his sacral wound. He is not a very good historian. He does note some pain in the wound area.  Past Medical History:  Diagnosis Date   Alcohol abuse    Anemia    Arthritis    Carpal tunnel syndrome    Gout    History of radiation therapy 04/10/2017-06/04/2017   Site/dose:   The prostatic fossa was treated to 68.4 Gy in 38 fractions of 1.8 Gy   Myelofibrosis (HCC)    Prostate cancer [facility])     Past Surgical History:  Procedure Laterality Date   COLONOSCOPY WITH PROPOFOL  N/A 09/10/2023   Procedure: COLONOSCOPY WITH PROPOFOL ;  Surgeon: Cindie Carlin POUR, DO;  Location: [facility];  Service: Endoscopy;  Laterality: N/A;  11:30 am, asa 3   CYSTECTOMY     left bicept   IR GASTROSTOMY TUBE MOD SED  11/11/2024   IR TUNNELED CENTRAL VENOUS CATH PLC W IMG  10/28/2024   POLYPECTOMY  09/10/2023   Procedure: POLYPECTOMY;  Surgeon: Cindie Carlin POUR, DO;  Location: [facility];  Service: Endoscopy;;   PROSTATECTOMY  2014   TENDON REPAIR     right thumb    Family History  Problem Relation Age of Onset   Heart disease Mother    Hypertension Sister    Cancer Maternal Aunt        breast   Cancer Maternal Uncle        colon   Cancer Maternal Uncle        lung    Social History:  reports that he has never smoked. He has never been exposed to tobacco smoke. His smokeless tobacco use includes snuff. He reports that he does not currently use alcohol. He reports that he does not use drugs.  Allergies: Allergies[1]  Medications: I have reviewed the patient's current medications.  Results for orders placed or performed during the hospital encounter of 10/01/24 (from the past 48 hours)   Hemoglobin and hematocrit, blood     Status: Abnormal   Collection Time: 12/17/24  5:52 PM  Result Value Ref Range   Hemoglobin 8.1 (L) 13.0 - 17.0 g/dL    Comment: REPEATED TO VERIFY POST TRANSFUSION SPECIMEN    HCT 24.2 (L) 39.0 - 52.0 %    Comment: Performed at [facility], [address], [place], [place] [address]  CBC with Differential/Platelet     Status: Abnormal   Collection Time: 12/18/24  6:13 AM  Result Value Ref Range   WBC 13.5 (H) 4.0 - 10.5 K/uL   RBC 2.39 (L) 4.22 - 5.81 MIL/uL   Hemoglobin 7.4 (L) 13.0 - 17.0 g/dL   HCT 77.4 (L) 60.9 - 47.9 %   MCV 94.1 80.0 - 100.0 fL   MCH 31.0 26.0 - 34.0 pg   MCHC 32.9 30.0 - 36.0 g/dL   RDW 84.5 88.4 - 84.4 %   Platelets 162 150 - 400 K/uL   nRBC 0.2 0.0 - 0.2 %   Neutrophils Relative % 81 %   Neutro Abs 10.8 (H) 1.7 - 7.7 K/uL   Lymphocytes Relative 6 %   Lymphs Abs 0.9 0.7 - 4.0 K/uL   Monocytes  Relative 7 %   Monocytes Absolute 0.9 0.1 - 1.0 K/uL   Eosinophils Relative 3 %   Eosinophils Absolute 0.4 0.0 - 0.5 K/uL   Basophils Relative 1 %   Basophils Absolute 0.2 (H) 0.0 - 0.1 K/uL   WBC Morphology See Note     Comment: INCREASED BANDS (>20% BANDS) Mild Left Shift (1-5% metas, occ myelo)    Smear Review Normal platelet morphology    Immature Granulocytes 2 %   Abs Immature Granulocytes 0.33 (H) 0.00 - 0.07 K/uL   Polychromasia PRESENT     Comment: Performed at [facility], [address] [place], [place] [address]  Renal function panel     Status: Abnormal   Collection Time: 12/18/24  6:13 AM  Result Value Ref Range   Sodium 137 135 - 145 mmol/L   Potassium 5.0 3.5 - 5.1 mmol/L   Chloride 99 98 - 111 mmol/L   CO2 27 22 - 32 mmol/L   Glucose, Bld 133 (H) 70 - 99 mg/dL    Comment: Glucose reference range applies only to samples taken after fasting for at least 8 hours.   BUN 111 (H) 8 - 23 mg/dL   Creatinine, Ser 7.20 (H) 0.61 - 1.24 mg/dL   Calcium  9.0 8.9 - 10.3 mg/dL   Phosphorus 2.5 2.5  - 4.6 mg/dL   Albumin 2.3 (L) 3.5 - 5.0 g/dL   GFR, Estimated 23 (L) >60 mL/min    Comment: (NOTE) Calculated using the CKD-EPI Creatinine Equation (2021)    Anion gap 11 5 - 15    Comment: Performed at [facility], [address] [place], [place] [address]  CBC     Status: Abnormal   Collection Time: 12/19/24  2:43 PM  Result Value Ref Range   WBC 18.7 (H) 4.0 - 10.5 K/uL   RBC 2.30 (L) 4.22 - 5.81 MIL/uL   Hemoglobin 7.1 (L) 13.0 - 17.0 g/dL   HCT 78.5 (L) 60.9 - 47.9 %   MCV 93.0 80.0 - 100.0 fL   MCH 30.9 26.0 - 34.0 pg   MCHC 33.2 30.0 - 36.0 g/dL   RDW 84.4 88.4 - 84.4 %   Platelets 177 150 - 400 K/uL   nRBC 0.1 0.0 - 0.2 %    Comment: Performed at [facility], [address], [place], [place] [address]    No results found.  Review of Systems  Unable to perform ROS: Other   Blood pressure 134/63, pulse 75, temperature 98 F (36.7 C), temperature source Oral, resp. rate 17, height 6' 1 (1.854 m), weight 59.7 kg, SpO2 100%. Physical Exam Cardiovascular:     Rate and Rhythm: Normal rate and regular rhythm.  Pulmonary:     Effort: Pulmonary effort is normal.     Breath sounds: Normal breath sounds. No wheezing.  Abdominal:     Palpations: Abdomen is soft.     Tenderness: There is no abdominal tenderness.  Skin:    Comments: Sacral area with large decub, wide ring of eschar, central open area with some fibrinoue exudate and granulation tissue  Neurological:     Mental Status: He is alert.     Assessment/Plan: Sacral decubitus wound - no need for emergent debridement. Recommend wet to dry dressing to central area. Off load as possible. If he is admitted for another reason to [name], the [facility] team can evaluate. This wound itself does not require admission but we will F/U if  he is admitted for another reason.  Ricardo [name] Ricardo [name] 12/19/2024, 4:20 PM         [1] No Known Allergies

## 2024-12-19 NOTE — ED Triage Notes (Signed)
 [facility] and sacral wound consult per staff patient has been experiencing this since 12/22,

## 2024-12-19 NOTE — ED Notes (Signed)
 Called 5E, awaiting transport for pt to go back up

## 2024-12-19 NOTE — Progress Notes (Signed)
 "     PATIENT INFORMATION   Patient Name: Ricardo [name]  Date of Birth: [birth year]  MRN: [id number]   Date of Admission: 10/01/2024  5:53 PM Length of Stay: 79 Length of Stay: 79   Primary Care Physician: No primary care provider on file.  Attending Physician:VAN EYK, JASON J, MD    SUBJECTIVE   Chief Complaint:  Status post septic shock and acute respiratory failure.  Interval Summary:  Ricardo [name] is a 74 y.o. male that has been admitted to [facility].  The patient was admitted to [facility] from 08/03/2024 until 10/01/2024.  The patient was [facility] for UTI, septic shock, acute respiratory failure an absolute neutropenia.  He was subsequently transferred to [facility] medical center for anuric  renal failure in the setting of acute hypoxemic respiratory failure.  The patient underwent CRRT from 08/06/2024 and was transitioned to intermittent hemodialysis.  The patient's course at the outside facility was complicated by requiring high O2.  Patient was on BiPAP, high-flow nasal cannula.  The patient was intubated but self extubated on 09/01/2024.  The patient was intubated twice while at the outside facility.  The patient has stated he did not want tracheostomy placed so he changed his status to DNR/DNI.  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  While at the outside facility patient was diagnosed with ESBL bacteremia and Enterococcus faecalis peritonitis.  He was started on ertapenem, for a total of 2 weeks to end on 10/05/2024.  09/28/2024 patient underwent paracentesis with removal of 1.8 L. thirty fluid was positive for SPB ,cultures were positive for Enterococcus faecalis and patient was discharged on IV ampicillin and Rocephin  to end on 10/07/2021  Subjective:  The patient remains on room air.  He is alert and oriented times 2-3.  He remains on PEG tube feeds at this time.  He is still  having some blood coming from his penis with a clot at times.  There has been no problems overnight.     OBJECTIVE   Objective  Vital Signs:  BP 137/62   Pulse 75   Temp 97.3 F (36.3 C) (Axillary)   Resp 21   Ht 6' 1 (1.854 m)   Wt 130 lb (59 kg)   SpO2 96%   BMI 17.15 kg/m    I/O last 24 Hours:  Intake/Output Summary (Last 24 hours) at 12/19/2024 1510 Last data filed at 12/19/2024 1300 Gross per 24 hour  Intake 1918 ml  Output 1700 ml  Net 218 ml     Physical Exam:  Elderly cachectic appearing gentleman lying in bed awake  in no obvious distress HEENT:  Normocephalic atraumatic, PERRLA EOMs intact nares  patent, oropharynx clear Neck : tracheostomy  midline, no secretions  Cardiovascular : S1-S2 no murmurs rubs or gallops Lungs:  Decreased breath sounds in the bases bilateral Abdomen: positive bowel sounds soft peg intact Extremities: no clubbing cyanosis or edema  Neuro: awake alert and talking  Labs:   No new labs  His blood cultures from 12/10/2024 are currently showing no growth.  Xrays:  KUB 12/12/2024:  Mild-to-moderate stool burden with no abnormal bowel dilation.  MRI pelvis 12/10/2024:  Midline wound posterior to the sacrum and coccyx extending deep to the level of the sacrococcygeal junction with gas extending through the defect at the level of the sacrococcygeal junction and associated marrow signal abnormality, compatible with osteomyelitis. No abscess.  8.0 x  6.0 x 3.4 cm intramuscular hematoma within the right iliacus muscle.  Diffuse body wall anasarca and intramuscular edema.  CXR 12/10/2024:  Minimal bibasilar subsegmental atelectasis.  CT abd/pelvis 12/08/2024:  There is a deep wound midline inferior to the coccyx with findings concerning for diskitis/osteomyelitis assess sacrococcygeal junction.  There was no organized fluid collection.  CXR 10/20/2024:  Mild interstitial edema with bilateral pleural effusions and bibasilar atelectasis.      ASSESSMENT AND PLAN   Ricardo [name] is a 73 y.o. male  who was admitted on 10/01/2024 with Acute kidney injury due to sepsis [N17.9, A41.9] .    Assessment/Plan:  ESBL bacteremia.  The patient has completed a course of ertapenem for his bacteremia.  Repeat blood cultures on 10/04/2024 were negative.    Enterococcus faecalis peritonitis.  The patient completed a course of IV ampicillin and Rocephin  for his peritonitis on 10/14/2024.   We will continue to monitor his white blood cell count and fever curve.  He was noted to have some abdominal distention where he was sent to IR where he had a abdominal ultrasound done 11/18/2024 and there was no significant ascites by ultrasound.    Acute kidney injury currently on hemodialysis.  He went into acute renal failure due to septic shock with ATN.  The patient is status post right tunneled HD catheter placement on 10/28/2024.  Nephrology is currently following and we will continue on dialysis on a M/W/F schedule.   UTI/sepsis.  The patient is now having low-grade temp on 12/06/2024 and has recurrence of his hematuria.  His UA showed a probable urinary tract infection where I ordered a repeat urine culture on 12/06/2024.  He had a UTI that was treated last month that showed ESBL Klebsiella pneumoniae.  He was started on empiric IV meropenem based on those sensitivities.  Nursing was unable to obtain a urine culture before we started him on his antibiotics.  He had continued fever until 12/10/2024 where blood cultures as well as a chest x-ray were obtained.  His chest x-ray was clear and his blood cultures are currently negative.  I also broadened out his antibiotics to IV vancomycin  for Gram positive coverage.  Unfortunately, the patient went into worsening sepsis on the night of 12/11/2024 where he was given a normal saline bolus and started on Levophed.  His Levophed has now been weaned off in his blood pressure has been stable.  He is still having blood  coming from his penis and I think we are going to need to refer him for Urology for cystoscopy.  When we sent him down to the ER on Friday I will consult Urology as well.  Possible dental abscess.  He has poor dentition and a possible dental abscess with pain in his left jaw.  I will start him on empiric amoxicillin.  Acute metabolic encephalopathy.  He developed encephalopathy with the acute delirium at the outside hospital.  He is currently on sertraline  and trazodone  at this time.  His mental status has improved and he is now back to baseline per his daughter.  Myelofibrosis/anemia.  He has some anemia andthrombocytopenia but at baseline, he does not have leukocytosis.  We will transfuse him as needed.  They have started him on Micera for his anemia of chronic kidney disease.  He had a negative stool guaiac on 10/05/2024.  His iron studies show a low iron level.   His anemia is probably related to his myelofibrosis and chronic kidney disease.  He has  required frequent transfusions during his stay here at [facility].  He will be transfused today on 12/16/2024.  Hypothyroidism.  He remains on levothyroxine at this time.  Paraphimosis.  He was seen by Urology in 08/2024.  A Foley was placed and was then removed on 09/25/2024.  He currently does not have a Foley catheter.  Atrial fibrillation/sinus tachycardia.  He seems to be rate controlled at this time.  He is on amiodarone as well as metoprolol.  His Aspirin  held due to severe anemia.  The patient has had some sinus tachycardia where we increased his metoprolol to 25 mg t.i.d..  We will continue to monitor with telemetry.  His sinus tachycardia has been intermittent and we will continue his current dosing.  We increased his amiodarone from 100 mg to 200 mg daily but he still has some sinus tachycardia.  Cardiology was consulted he is on Cardizem CD 360 mg daily and metoprolol 50 mg b.i.d. which was controlling his heart rate.  The patient went  back into sinus tachycardia on 12/11/2024 where I have loaded him with amiodarone and IV digoxin.  His Levophed has been discontinued and now his sinus tachycardia is resolved.  We will continue to monitor with telemetry.  Wounds.  The patient has an unstageable sacral pressure ulcer as well as a deep tissue injury to his left ear and his nose.  Wound Care is following.  We will continue with the Tylenol  and oxycodone  PRN.  Wound Care felt that his wound looked infected on 12/08/2024 where we obtain a CT scan of his abdomen and pelvis on 12/08/2024 that showed a deep wound midline inferior to the coccyx with findings concerning for diskitis/osteomyelitis at the sacrococcygeal junction.  His MRI of his pelvis on 12/10/2024 shows osteomyelitis at the sacrococcygeal junction.  He is currently on IV meropenem and IV vancomycin .  Wound care called me into the room on 12/15/2024 where over the last 4-5 days they state that his sacral wound has worsened.  He looks like he has a infection around this wound and that he needs possible debridement in the OR.  I spoke to General surgery but they stated that if he is not an acute/emergent case and that they asked to reconsult them on 12/19/2024 after the New Year.    Severe protein calorie malnutrition/dysphagia.  He is status post PEG tube placement.  He has had a PEG tube malfunction where  Interventional Radiology replaced his PEG tube on 11/11/2024.  He has had some complication of some bleeding around his PEG tube site where Interventional re-evaluated him.  He has not had any further bleeding from around his PEG tube site.  He is currently tolerating tube feeds well.  The dietitian will continue to follow along.  Foot pain.  He is probably having some neuropathy since he is complaining of pain in both feet. started on some low-dose gabapentin 100 mg at night.  Debility.  PT and OT therapies are currently following.  Prevention.  Heparin  for DVT prophylaxis held  (due to severe anemia) and omeprazole for GI prophylaxis.  Dispo.  I spoke to case management and they are currently working on a dialysis chair for discharge.  He will need skilled nursing facility placement.     I spoke to General surgery this morning about debridement of this wound.  I spoke to the emergency room physician who was willing to take the patient for general surgery consult.  I have also asked him to consult  Urology due to the gross hematuria while he is in the emergency room.  We are currently trying to arrange for this.      Code Status:  Limited Resuscitation   Spent a total of 35 minutes on this encounter. This includes reviewing patient's extensive history, assessment and visit with the patient as well as documentation time. Time spent also includes IDT collaboration.    SIGNATURE   Electronically signed:  VAN EYK, JASON J, MD  12/19/2024 at 3:10 PM EST      "

## 2024-12-19 NOTE — Discharge Instructions (Signed)
 General surgery recommended continue local wound care. No debridement at this time.   Urology recommended outpatient follow up for hematuria

## 2024-12-20 LAB — CBC WITH DIFFERENTIAL/PLATELET
Abs Immature Granulocytes: 0.42 K/uL — ABNORMAL HIGH (ref 0.00–0.07)
Basophils Absolute: 0.3 K/uL — ABNORMAL HIGH (ref 0.0–0.1)
Basophils Relative: 2 %
Eosinophils Absolute: 0.5 K/uL (ref 0.0–0.5)
Eosinophils Relative: 3 %
HCT: 21.7 % — ABNORMAL LOW (ref 39.0–52.0)
Hemoglobin: 7.1 g/dL — ABNORMAL LOW (ref 13.0–17.0)
Immature Granulocytes: 2 %
Lymphocytes Relative: 6 %
Lymphs Abs: 1 K/uL (ref 0.7–4.0)
MCH: 30.9 pg (ref 26.0–34.0)
MCHC: 32.7 g/dL (ref 30.0–36.0)
MCV: 94.3 fL (ref 80.0–100.0)
Monocytes Absolute: 1.4 K/uL — ABNORMAL HIGH (ref 0.1–1.0)
Monocytes Relative: 8 %
Neutro Abs: 14.6 K/uL — ABNORMAL HIGH (ref 1.7–7.7)
Neutrophils Relative %: 79 %
Platelets: 182 K/uL (ref 150–400)
RBC: 2.3 MIL/uL — ABNORMAL LOW (ref 4.22–5.81)
RDW: 15.7 % — ABNORMAL HIGH (ref 11.5–15.5)
Smear Review: NORMAL
WBC: 18.2 K/uL — ABNORMAL HIGH (ref 4.0–10.5)
nRBC: 0.4 % — ABNORMAL HIGH (ref 0.0–0.2)

## 2024-12-20 LAB — RENAL FUNCTION PANEL
Albumin: 2.3 g/dL — ABNORMAL LOW (ref 3.5–5.0)
Anion gap: 11 (ref 5–15)
BUN: 79 mg/dL — ABNORMAL HIGH (ref 8–23)
CO2: 27 mmol/L (ref 22–32)
Calcium: 9 mg/dL (ref 8.9–10.3)
Chloride: 99 mmol/L (ref 98–111)
Creatinine, Ser: 2.3 mg/dL — ABNORMAL HIGH (ref 0.61–1.24)
GFR, Estimated: 30 mL/min — ABNORMAL LOW
Glucose, Bld: 116 mg/dL — ABNORMAL HIGH (ref 70–99)
Phosphorus: 3.4 mg/dL (ref 2.5–4.6)
Potassium: 4.6 mmol/L (ref 3.5–5.1)
Sodium: 137 mmol/L (ref 135–145)

## 2024-12-23 LAB — CBC WITH DIFFERENTIAL/PLATELET
Abs Immature Granulocytes: 0.22 K/uL — ABNORMAL HIGH (ref 0.00–0.07)
Basophils Absolute: 0.1 K/uL (ref 0.0–0.1)
Basophils Relative: 1 %
Eosinophils Absolute: 0.3 K/uL (ref 0.0–0.5)
Eosinophils Relative: 3 %
HCT: 15.4 % — ABNORMAL LOW (ref 39.0–52.0)
Hemoglobin: 5 g/dL — CL (ref 13.0–17.0)
Immature Granulocytes: 2 %
Lymphocytes Relative: 6 %
Lymphs Abs: 0.9 K/uL (ref 0.7–4.0)
MCH: 31.3 pg (ref 26.0–34.0)
MCHC: 32.5 g/dL (ref 30.0–36.0)
MCV: 96.3 fL (ref 80.0–100.0)
Monocytes Absolute: 1 K/uL (ref 0.1–1.0)
Monocytes Relative: 7 %
Neutro Abs: 10.8 K/uL — ABNORMAL HIGH (ref 1.7–7.7)
Neutrophils Relative %: 81 %
Platelets: 127 K/uL — ABNORMAL LOW (ref 150–400)
RBC: 1.6 MIL/uL — ABNORMAL LOW (ref 4.22–5.81)
RDW: 16.7 % — ABNORMAL HIGH (ref 11.5–15.5)
Smear Review: NORMAL
WBC: 13.3 K/uL — ABNORMAL HIGH (ref 4.0–10.5)
nRBC: 0.2 % (ref 0.0–0.2)

## 2024-12-23 LAB — RENAL FUNCTION PANEL
Albumin: 2.4 g/dL — ABNORMAL LOW (ref 3.5–5.0)
Anion gap: 11 (ref 5–15)
BUN: 93 mg/dL — ABNORMAL HIGH (ref 8–23)
CO2: 28 mmol/L (ref 22–32)
Calcium: 9.2 mg/dL (ref 8.9–10.3)
Chloride: 99 mmol/L (ref 98–111)
Creatinine, Ser: 2.9 mg/dL — ABNORMAL HIGH (ref 0.61–1.24)
GFR, Estimated: 22 mL/min — ABNORMAL LOW
Glucose, Bld: 117 mg/dL — ABNORMAL HIGH (ref 70–99)
Phosphorus: 4.6 mg/dL (ref 2.5–4.6)
Potassium: 4.7 mmol/L (ref 3.5–5.1)
Sodium: 138 mmol/L (ref 135–145)

## 2024-12-23 LAB — PREPARE RBC (CROSSMATCH)

## 2024-12-23 LAB — HEMOGLOBIN AND HEMATOCRIT, BLOOD
HCT: 29.3 % — ABNORMAL LOW (ref 39.0–52.0)
Hemoglobin: 9.8 g/dL — ABNORMAL LOW (ref 13.0–17.0)

## 2024-12-24 LAB — TYPE AND SCREEN
ABO/RH(D): O NEG
Antibody Screen: NEGATIVE
Unit division: 0
Unit division: 0

## 2024-12-24 LAB — BPAM RBC
Blood Product Expiration Date: 202601212359
Blood Product Expiration Date: 202601312359
ISSUE DATE / TIME: 202601060803
ISSUE DATE / TIME: 202601060946
Unit Type and Rh: 9500
Unit Type and Rh: 9500

## 2024-12-24 NOTE — Nursing Note (Signed)
 Patient pulled off his telemetry leads. Leads were reapplied to skin, and patient was redirected.

## 2024-12-24 NOTE — Unmapped External Note (Signed)
 Case Management Reassessment Note  CM Reassessment Note:   Contact with:  Adult Child  Name(s):  [name] Swinford-daughter  Discharge Plan Status:  Tentative  Patient Discharge Goal:  SNF/SNU  Back-Up Discharge Goal:  SNF/SNU  Acceptance of Discharge Plan:  Family/Caregiver verbalizes acceptance and Patient verbalizes acceptance  Team Conference Review:  Reviewed results with patient/family, Shared responsibility to participate in the discharge planning process and Estimated DC Date relayed  Clinical Assessment and Discharge Plan of Care Discussed:  Dialysis and Wound  Barriers and Risks Discussed:  Barriers to reaching goals  Barriers to Reaching Goals Details:  Stretcher dialyisis, wounds  Benefits of and limitations of services discussed:  [email or web address] Provider quality of care and resource use measures list(s) provided and Geographic limitations of patient specific treatment    Expected Discharge Date: 01/02/2025   Narrative  Called patient daughter, [name] for discharge planning updates. The discharge plan is out of state SNF due to patient needing stretcher dialysis. The patient is tolerating dialysis 3x a week. He is being followed by wound care. On IV Merrem  until 1/9 for osteomyelitis.  He remains alert and oriented x2.  He remains on room air. He is tolerating PEG tube feeds well. Received PRBC yesterday due to hgb 5.0 . He is participating with therapy. Patient daughter [name] will be in on Friday for a goals of care meeting. CM will continue to monitor for discharge planning needs   FRAZIER, LAKETHA, LPN 07/19/7972

## 2024-12-25 LAB — CBC WITH DIFFERENTIAL/PLATELET
Abs Immature Granulocytes: 0.21 K/uL — ABNORMAL HIGH (ref 0.00–0.07)
Basophils Absolute: 0.2 K/uL — ABNORMAL HIGH (ref 0.0–0.1)
Basophils Relative: 2 %
Eosinophils Absolute: 0.3 K/uL (ref 0.0–0.5)
Eosinophils Relative: 2 %
HCT: 23.7 % — ABNORMAL LOW (ref 39.0–52.0)
Hemoglobin: 7.9 g/dL — ABNORMAL LOW (ref 13.0–17.0)
Immature Granulocytes: 1 %
Lymphocytes Relative: 5 %
Lymphs Abs: 0.8 K/uL (ref 0.7–4.0)
MCH: 29.4 pg (ref 26.0–34.0)
MCHC: 33.3 g/dL (ref 30.0–36.0)
MCV: 88.1 fL (ref 80.0–100.0)
Monocytes Absolute: 1 K/uL (ref 0.1–1.0)
Monocytes Relative: 7 %
Neutro Abs: 12.3 K/uL — ABNORMAL HIGH (ref 1.7–7.7)
Neutrophils Relative %: 83 %
Platelets: 119 K/uL — ABNORMAL LOW (ref 150–400)
RBC: 2.69 MIL/uL — ABNORMAL LOW (ref 4.22–5.81)
RDW: 17.5 % — ABNORMAL HIGH (ref 11.5–15.5)
Smear Review: NORMAL
WBC: 14.9 K/uL — ABNORMAL HIGH (ref 4.0–10.5)
nRBC: 0.1 % (ref 0.0–0.2)

## 2024-12-25 LAB — RENAL FUNCTION PANEL
Albumin: 2.6 g/dL — ABNORMAL LOW (ref 3.5–5.0)
Anion gap: 12 (ref 5–15)
BUN: 73 mg/dL — ABNORMAL HIGH (ref 8–23)
CO2: 28 mmol/L (ref 22–32)
Calcium: 9.3 mg/dL (ref 8.9–10.3)
Chloride: 95 mmol/L — ABNORMAL LOW (ref 98–111)
Creatinine, Ser: 2.45 mg/dL — ABNORMAL HIGH (ref 0.61–1.24)
GFR, Estimated: 27 mL/min — ABNORMAL LOW
Glucose, Bld: 118 mg/dL — ABNORMAL HIGH (ref 70–99)
Phosphorus: 3.4 mg/dL (ref 2.5–4.6)
Potassium: 4.2 mmol/L (ref 3.5–5.1)
Sodium: 135 mmol/L (ref 135–145)

## 2024-12-25 LAB — HEPATITIS B SURFACE ANTIGEN: Hepatitis B Surface Ag: NONREACTIVE

## 2024-12-27 LAB — RENAL FUNCTION PANEL
Albumin: 2.3 g/dL — ABNORMAL LOW (ref 3.5–5.0)
Anion gap: 12 (ref 5–15)
BUN: 64 mg/dL — ABNORMAL HIGH (ref 8–23)
CO2: 25 mmol/L (ref 22–32)
Calcium: 8.9 mg/dL (ref 8.9–10.3)
Chloride: 98 mmol/L (ref 98–111)
Creatinine, Ser: 2.53 mg/dL — ABNORMAL HIGH (ref 0.61–1.24)
GFR, Estimated: 26 mL/min — ABNORMAL LOW
Glucose, Bld: 121 mg/dL — ABNORMAL HIGH (ref 70–99)
Phosphorus: 3.2 mg/dL (ref 2.5–4.6)
Potassium: 4.6 mmol/L (ref 3.5–5.1)
Sodium: 135 mmol/L (ref 135–145)

## 2024-12-27 LAB — CBC WITH DIFFERENTIAL/PLATELET
Abs Immature Granulocytes: 0.18 K/uL — ABNORMAL HIGH (ref 0.00–0.07)
Basophils Absolute: 0.1 K/uL (ref 0.0–0.1)
Basophils Relative: 1 %
Eosinophils Absolute: 0.2 K/uL (ref 0.0–0.5)
Eosinophils Relative: 2 %
HCT: 17.2 % — ABNORMAL LOW (ref 39.0–52.0)
Hemoglobin: 5.6 g/dL — CL (ref 13.0–17.0)
Immature Granulocytes: 2 %
Lymphocytes Relative: 9 %
Lymphs Abs: 1.1 K/uL (ref 0.7–4.0)
MCH: 29.9 pg (ref 26.0–34.0)
MCHC: 32.6 g/dL (ref 30.0–36.0)
MCV: 92 fL (ref 80.0–100.0)
Monocytes Absolute: 0.9 K/uL (ref 0.1–1.0)
Monocytes Relative: 7 %
Neutro Abs: 9.8 K/uL — ABNORMAL HIGH (ref 1.7–7.7)
Neutrophils Relative %: 79 %
Platelets: 80 K/uL — ABNORMAL LOW (ref 150–400)
RBC: 1.87 MIL/uL — ABNORMAL LOW (ref 4.22–5.81)
RDW: 17 % — ABNORMAL HIGH (ref 11.5–15.5)
WBC: 12.3 K/uL — ABNORMAL HIGH (ref 4.0–10.5)
nRBC: 0.3 % — ABNORMAL HIGH (ref 0.0–0.2)

## 2024-12-27 LAB — PREPARE RBC (CROSSMATCH)

## 2024-12-28 LAB — HEMOGLOBIN AND HEMATOCRIT, BLOOD
HCT: 23.3 % — ABNORMAL LOW (ref 39.0–52.0)
Hemoglobin: 7.7 g/dL — ABNORMAL LOW (ref 13.0–17.0)

## 2024-12-29 LAB — BPAM RBC
Blood Product Expiration Date: 202601142359
Blood Product Expiration Date: 202602012359
ISSUE DATE / TIME: 202601100945
ISSUE DATE / TIME: 202601101133
Unit Type and Rh: 9500
Unit Type and Rh: 9500

## 2024-12-29 LAB — TYPE AND SCREEN
ABO/RH(D): O NEG
Antibody Screen: NEGATIVE
Unit division: 0
Unit division: 0

## 2024-12-30 LAB — CBC WITH DIFFERENTIAL/PLATELET
Abs Immature Granulocytes: 0.28 K/uL — ABNORMAL HIGH (ref 0.00–0.07)
Basophils Absolute: 0.1 K/uL (ref 0.0–0.1)
Basophils Relative: 1 %
Eosinophils Absolute: 0.3 K/uL (ref 0.0–0.5)
Eosinophils Relative: 2 %
HCT: 22.7 % — ABNORMAL LOW (ref 39.0–52.0)
Hemoglobin: 7.5 g/dL — ABNORMAL LOW (ref 13.0–17.0)
Immature Granulocytes: 3 %
Lymphocytes Relative: 10 %
Lymphs Abs: 1.1 K/uL (ref 0.7–4.0)
MCH: 30.6 pg (ref 26.0–34.0)
MCHC: 33 g/dL (ref 30.0–36.0)
MCV: 92.7 fL (ref 80.0–100.0)
Monocytes Absolute: 0.7 K/uL (ref 0.1–1.0)
Monocytes Relative: 6 %
Neutro Abs: 7.9 K/uL — ABNORMAL HIGH (ref 1.7–7.7)
Neutrophils Relative %: 78 %
Platelets: 73 K/uL — ABNORMAL LOW (ref 150–400)
RBC: 2.45 MIL/uL — ABNORMAL LOW (ref 4.22–5.81)
RDW: 16.7 % — ABNORMAL HIGH (ref 11.5–15.5)
Smear Review: NORMAL
WBC: 10.3 K/uL (ref 4.0–10.5)
nRBC: 0.2 % (ref 0.0–0.2)

## 2024-12-30 LAB — COMPREHENSIVE METABOLIC PANEL WITH GFR
ALT: 36 U/L (ref 0–44)
AST: 52 U/L — ABNORMAL HIGH (ref 15–41)
Albumin: 2.7 g/dL — ABNORMAL LOW (ref 3.5–5.0)
Alkaline Phosphatase: 378 U/L — ABNORMAL HIGH (ref 38–126)
Anion gap: 12 (ref 5–15)
BUN: 43 mg/dL — ABNORMAL HIGH (ref 8–23)
CO2: 29 mmol/L (ref 22–32)
Calcium: 9.2 mg/dL (ref 8.9–10.3)
Chloride: 97 mmol/L — ABNORMAL LOW (ref 98–111)
Creatinine, Ser: 1.67 mg/dL — ABNORMAL HIGH (ref 0.61–1.24)
GFR, Estimated: 43 mL/min — ABNORMAL LOW
Glucose, Bld: 124 mg/dL — ABNORMAL HIGH (ref 70–99)
Potassium: 4.1 mmol/L (ref 3.5–5.1)
Sodium: 137 mmol/L (ref 135–145)
Total Bilirubin: 0.4 mg/dL (ref 0.0–1.2)
Total Protein: 7.8 g/dL (ref 6.5–8.1)

## 2024-12-30 LAB — CBC
HCT: 24 % — ABNORMAL LOW (ref 39.0–52.0)
Hemoglobin: 7.8 g/dL — ABNORMAL LOW (ref 13.0–17.0)
MCH: 30.5 pg (ref 26.0–34.0)
MCHC: 32.5 g/dL (ref 30.0–36.0)
MCV: 93.8 fL (ref 80.0–100.0)
Platelets: 74 K/uL — ABNORMAL LOW (ref 150–400)
RBC: 2.56 MIL/uL — ABNORMAL LOW (ref 4.22–5.81)
RDW: 17.2 % — ABNORMAL HIGH (ref 11.5–15.5)
WBC: 10.4 K/uL (ref 4.0–10.5)
nRBC: 0.2 % (ref 0.0–0.2)

## 2024-12-30 LAB — RENAL FUNCTION PANEL
Albumin: 2.7 g/dL — ABNORMAL LOW (ref 3.5–5.0)
Anion gap: 11 (ref 5–15)
BUN: 94 mg/dL — ABNORMAL HIGH (ref 8–23)
CO2: 28 mmol/L (ref 22–32)
Calcium: 9.6 mg/dL (ref 8.9–10.3)
Chloride: 95 mmol/L — ABNORMAL LOW (ref 98–111)
Creatinine, Ser: 3.28 mg/dL — ABNORMAL HIGH (ref 0.61–1.24)
GFR, Estimated: 19 mL/min — ABNORMAL LOW
Glucose, Bld: 110 mg/dL — ABNORMAL HIGH (ref 70–99)
Phosphorus: 2.8 mg/dL (ref 2.5–4.6)
Potassium: 5.1 mmol/L (ref 3.5–5.1)
Sodium: 134 mmol/L — ABNORMAL LOW (ref 135–145)

## 2024-12-31 LAB — MAGNESIUM: Magnesium: 2.3 mg/dL (ref 1.7–2.4)

## 2024-12-31 NOTE — Unmapped External Note (Signed)
 "      Occupational Therapy     Weekly Progress Note  Patient Name: Ricardo [name] Patient Birthdate: 11-19-1951    OT Current Functional Status:  Pt with increased fatigue this reporting period and decreased activity tolerance impacting engagement with therapy. Pt requires increased time to initiate tasks and increased assistance with ADLs.    Facilitating Factors in Goal Achievement:  Patient compliance Barriers to Goal Achievement:  Strength limitations, Diminished endurance and Medical complications       CARE Score Key: 6: Independent. Helper provides no assistance with tasks. A device may or may not have been used. 5: Set-up or clean-up assistance. Helper sets up or cleans up, but does not assist with tasks. Helper may have assisted prior to or following the activity. 4: Supervision or touching assistance. Helper provides verbal cues or touching/steadying or contact guard assistance. Assistance may be provided throughout the activity or intermittently. 3: Partial/moderate assistance. Helper does less than half the effort. Helper lifts, holds, or supports trunk or limbs, but provides less than half the effort. 2: Substantial/maximal assistance. Helper does more than half the effort. Helper lifts or holds trunk or limbs, and provides more than half the effort. 1: Dependent. Helper does all of the effort, or the assistance of two or more helpers is required for the patient to complete the activity. -: Inconsistent or incomplete documentation  Activity not attempted values: 7: Patient refused 9: Not applicable - Not attempted and the patient did not perform this activity prior to the current illness, exacerbation, or injury. 10: Not attempted due to environmental limitations (e.g., lack of equipment, weather constraints) 88: Not attempted due to medical condition or safety concerns  Goals: Goal Status on Admission Current Status  Roll Left and Right LTG: Set-up/clean-up Roll Left  and Right - CARE Score: 1 (10/03/24 0930) Roll Left and Right - CARE Score: 1 (12/31/24 0944)  Eating LTG: Set-up/clean-up Eating - CARE Score: 88 (10/03/24 0930) Eating - CARE Score: 88 (12/31/24 0944)  Oral Hygiene LTG: Set-up/clean-up Oral Hygiene - CARE Score: 1 (10/03/24 0930) Oral Hygiene - CARE Score: 4 (12/31/24 0944)  Toilet Transfer LTG: Supervision or touching assistance Toilet Transfer - CARE Score: 88 (10/03/24 0930) Toilet Transfer - CARE Score: 88 (12/31/24 0944)  Toileting Hygiene LTG: Supervision or touching assistance Toileting Hygiene - CARE Score: 1 (10/03/24 0930) Toileting Hygiene - CARE Score: 1 (12/31/24 0944)     Picking Up Object - CARE Score: 88 (10/03/24 0930) Picking Up Object - CARE Score: 88 (12/31/24 0944)     Car Transfer - CARE Score: 10 (10/03/24 0930) Car Transfer - CARE Score: 10 (12/31/24 0944)    Other Goals: Goal Status on Admission Current Status  Goal Status - Patient will demonstrate Grooming : Set-Up/clean-up Status on Evaluation(Complete Only on Evaluation): TD (10/03/24 0930) Current Status: Supervision (12/31/24 0945)  Goal Status - Patient will complete Upper Body Dressing: Set-Up/clean-up Status on Evaluation(Complete Only on Evaluation): TD (10/03/24 0930) Current Status: Moderate assistance (12/31/24 0945)  Goal Status - Patient will complete Upper Body Bathing: Set-Up/clean-up Status on Evaluation(Complete Only on Evaluation): TD (10/03/24 0930) Current Status: Moderate assistance (12/31/24 0945)  Goal Status - Patient will complete Lower Body Dressing: Moderate assistance Status on Evaluation(Complete Only on Evaluation): TD (10/03/24 0930) Current Status: Dependent (12/31/24 0945)  Goal Status - Patient will complete Lower Body Bathing: Minimal assistance Status on Evaluation(Complete Only on Evaluation): TD (10/03/24 0930) Current Status: Dependent (12/31/24 0945)  Goal Status - Patient will demonstrate____activity tolerance: Good  Status on Evaluation(Complete Only on Evaluation): poor (10/03/24 0930) Current Status: Poor (12/31/24 0945)                       Goal Status - Patient will demonstrate Safety Awareness: Good Status on Evaluation(Complete Only on Evaluation): poor (10/03/24 0930)    Goal Status - Patient will not present with further deterioration of  current condition: Will not present with further deterioration of current condition Status on Evaluation(Complete Only on Evaluation): will not present with further deterioration of current condition (10/03/24 0930)      Additional Goals &Status: N/A      LILLA SETTER, OT 12/31/2024       "

## 2024-12-31 NOTE — Unmapped External Note (Signed)
 Case Management Reassessment Note  CM Reassessment Note:   Contact with:  Adult Child  Name(s):  Ricardo [name]  Discharge Plan Status:  Tentative  Patient Discharge Goal:  SNF/SNU  Back-Up Discharge Goal:  SNF/SNU  Acceptance of Discharge Plan:  Patient verbalizes acceptance and Family/Caregiver verbalizes acceptance  Team Conference Review:  Reviewed results with patient/family, Shared responsibility to participate in the discharge planning process and Estimated DC Date relayed  Clinical Assessment and Discharge Plan of Care Discussed:  Dialysis and Wound  Barriers and Risks Discussed:  Barriers to reaching goals  Barriers to Reaching Goals Details:  Wounds, stretcher dialysis  Benefits of and limitations of services discussed:  [email or web address] Provider quality of care and resource use measures list(s) provided and Geographic limitations of patient specific treatment    Expected Discharge Date: 01/09/2025   Narrative  Called patient daughter, Ricardo for discharge planning updates had to leave a message. The discharge plan is out of state SNF due to patient needing stretcher dialysis. The patient is tolerating dialysis 3x a week. He is being followed by wound care. He remains alert and oriented x2.  He remains on room air. He is tolerating PEG tube feeds well. He is participating with therapy. Ricardo missed the goals of care meeting last week. CM will continue to monitor for discharge planning needs   FRAZIER, LAKETHA, LPN [id number]

## 2024-12-31 NOTE — Unmapped External Note (Signed)
" °  Problem: Infection Goal: Absence of infection and prevention of transmission during hospitalization Outcome: Progressing   Problem: Knowledge Deficit Goal: Patient and/or family demonstrate readiness to learn Outcome: Progressing Goal: Patient and/or family verbalizes understanding of education, and/or performs desired skill Outcome: Progressing   Problem: Discharge Planning Goal: Discharge to home or other facility with appropriate resources Outcome: Progressing Goal: Care giver will develop a plan to decrease their burden and enhance comfort in role Outcome: Progressing   Problem: Delirium Goal: Prevent and Manage Delirium Outcome: Progressing   Problem: Fall Safety: Universal Precautions Goal: Free from fall injury Outcome: Progressing Goal: Toilet Magnet Status ([facility] Only) Outcome: Progressing   Problem: Fall Prevention: Medication Bundle Goal: Patient will be free from Fall Injury Outcome: Progressing   Problem: Fall Prevention: Continence Bundle Goal: Patient will be free from Fall Injury Outcome: Progressing   Problem: Fall Prevention: Environment/Sensory Bundle Goal: Patient will be free from Fall Injury Outcome: Progressing   Problem: Fall Prevention Across Bundles Goal: Patient will be free from Fall Injury Outcome: Progressing   Problem: Pain Goal: Patient's Pain/Discomfort is Manageable Outcome: Progressing   Problem: Potential for Compromised Skin Integrity Goal: Skin integrity is maintained or improved Outcome: Progressing Goal: Nutritional status is improving Outcome: Progressing   Problem: Urinary Incontinence Goal: Perineal skin integrity is maintained or improved Outcome: Progressing   Problem: Bowel Incontinence Goal: Perineal Skin Integrity is Maintained or Improved Outcome: Progressing   Problem: Risk for/ Activity Intolerance Goal: Mobility/activity is maintained at optimum level for patient Outcome: Progressing   Problem: Risk  for / Impaired Skin Integrity Goal: Skin integrity is maintained or improved Outcome: Progressing   Problem: Risk for/ Decreased CO Goal: Patient has stable or improving hemodynamic status Outcome: Progressing   Problem: Ineffective Tissue Perfusion Goal: Patient will have stable or improving tissue perfusion Outcome: Progressing   Problem: Pain-Cardiac specific Goal: Report anginal episodes decreased in frequency, duration, and severity Outcome: Progressing   Problem: Risk for impaired gas exchange Goal: Patient will maintain adequate gas exchange Outcome: Progressing   Problem: Risk for fluid/electrolyte imbalance: Dehydration Goal: Fluid and electrolyte balance are achieved/maintained Outcome: Progressing   Problem: Risk for fluid/electrolyte imbalance: Overload Goal: Fluid and electrolyte balance are achieved/maintained Outcome: Progressing   Problem: Risk for / Imbalanced Nutrition Goal: Maintains adequate nutritional intake Outcome: Progressing   Problem: Risk for/Anxiety Goal: Demonstrates ability to cope effectively Outcome: Progressing   Problem: Knowledge Deficit Goal: Patient and/or family demonstrate readiness to learn Outcome: Progressing Goal: Patient and/or family verbalizes understanding of condition and treatment, education, and/or performs desired skill Outcome: Progressing   Problem: Fluid and electrolyte imbalance Goal: Patient has stable or improving fluid and electrolyte status Outcome: Progressing   Problem: Decreased Cardiac Output / Risk for/hemodynamic status/Impaired Tissue Perfusion Goal: Patient has stable or improving hemodynamic status Outcome: Progressing   Problem: Impaired urinary elimination Goal: Patient has stable or improving urinary elimination Outcome: Progressing   Problem: Fatigue Goal: Patient/caregiver verbalizes strategies to maintain/conserve energy Outcome: Progressing   Problem: Risk for / Impaired Mobility  (Activity Intolerance) Goal: Mobility/activity is maintained at optimum level for patient Outcome: Progressing   Problem: Risk for / Impaired Cognition Goal: Patient has stable or improving cognition Outcome: Progressing   Problem: Risk for Infection Goal: Absence of infection and prevention of transmission during hospitalization Outcome: Progressing   Problem: Hemodialysis/Peritoneal Dialysis Goal: Maintain stable fluid status Outcome: Progressing   "

## 2024-12-31 NOTE — Unmapped External Note (Signed)
 "      PT Weekly Progress Note  Patient Name: Ricardo [name] Patient Birthdate: Mar 16, 1951  PT CURRENT FUNCTIONAL STATUS:  PT Current Functional Status:    PT Current Functional Status:  Patient is 74 y/o M who was evaluated for PT on 09/22/24. Patient progressing towards goals set by PT. Patient is mod to max assist of 1 with bed mobility and max to total A for transfers. Patient tolerating EOB x 10 to 15 mins with c/g to mod of 1. Pt's progress has been limited by fluctuating medical status. P: Continue with PT to maintain current level of function and prevent contractures.   Factors in Goal Achievement: Facilitating Factors:  Support of family or caregiver Barriers:  ROM limitations, Balance deficits, Diminished endurance, Strength limitations, Motor control deficits and Cognitive deficits     DME Recommendations Common Therapy DME:  (to be determined)  CARE Score Key: 6: Independent. Helper provides no assistance with tasks. A device may or may not have been used. 5: Set-up or clean-up assistance. Helper sets up or cleans up, but does not assist with tasks. Helper may have assisted prior to or following the activity. 4: Supervision or touching assistance. Helper provides verbal cues or touching/steadying or contact guard assistance. Assistance may be provided throughout the activity or intermittently. 3: Partial/moderate assistance. Helper does less than half the effort. Helper lifts, holds, or supports trunk or limbs, but provides less than half the effort. 2: Substantial/maximal assistance. Helper does more than half the effort. Helper lifts or holds trunk or limbs, and provides more than half the effort. 1: Dependent. Helper does all of the effort, or the assistance of two or more helpers is required for the patient to complete the activity. -: Inconsistent or incomplete documentation  Activity not attempted values: 7: Patient refused 9: Not applicable - Not attempted and the  patient did not perform this activity prior to the current illness, exacerbation, or injury. 10: Not attempted due to environmental limitations (e.g., lack of equipment, weather constraints) 88: Not attempted due to medical condition or safety concerns  Goals: Goal Status on Admission Current Status  Sit to Lying LTG: Supervision or touching assistance Sit to Lying - CARE Score: 2 (10/02/24 1015) Sit to Lying - CARE Score: 2 (12/31/24 0646)  Lying to Sitting on Side of Bed LTG: Supervision or touching assistance Lying to Sitting on Side of Bed - CARE Score: 3 (10/02/24 1015) Lying to Sitting on Side of Bed - CARE Score: 2 (12/31/24 0646)  Sit to Stand LTG: Supervision or touching assistance Sit to Stand - CARE Score: 1 (10/02/24 1015) Sit to Stand - CARE Score: 1 (12/31/24 0646)  Chair/Bed-to-Chair Transfer LTG: Supervision or touching assistance Chair/Bed-to-Chair Transfer - CARE Score: 88 (10/02/24 1015) Chair/Bed-to-Chair Transfer - CARE Score: 1 (12/31/24 0646)  Walk 10 Feet LTG: Supervision or touching assistance Walk 10 Feet - CARE Score: 88 (10/02/24 1015)      Walking 10 Feet on Uneven Surfaces - CARE Score: 10 (10/02/24 1015)    Walk 50 Feet with Two Turns LTG: Supervision or touching assistance Walk 50 Feet with Two Turns - CARE Score: 88 (10/02/24 1015)      Walk 150 Feet - CARE Score: 88 (10/02/24 1015)    1 Step (Curb) LTG: Supervision or touching assistance 1 Step (Curb) - CARE Score: 88 (10/02/24 1015)    4 Steps LTG: Supervision or touching assistance 4 Steps - CARE Score: 88 (10/02/24 1015)  12 Steps - CARE Score: 88 (10/02/24 1015)    Wheel 50 Feet with Two Turns LTG: Supervision or touching assistance Wheel 50 Feet with Two Turns - CARE Score: 88 (10/02/24 1015)    Wheel 150 Feet LTG: Supervision or touching assistance Wheel 150 Feet - CARE Score: 88 (10/02/24 1015)     Other Goals: Goal Status on Admission Current Status  Goal Status - Patient will demonstrate sitting  balance: Supervision Status on Evaluation(Complete Only on Evaluation): min to mod A (10/02/24 1015) Current Status: Moderate assistance (12/31/24 9353)                                                    Additional Goals &Status: N/A      MERILEE LYNWOOD MATSU., PT 12/31/2024       "

## 2024-12-31 NOTE — Unmapped External Note (Signed)
"        Speech Language Pathologist          Treatment  Patient Name: Ricardo [name] Patient Birthdate: [birth year]   Patient Subjective Report - Pt alert and agreeable to treatment.           Pain Assessment Pain Context: Therapy Assessment During Treatment (12/31/24 1052) Pain Assessment: NRS 0-10 (12/31/24 1052) Pain Score: 0 - No pain (12/31/24 1052) Pain Severity - NRS (Calculated): No pain (12/31/24 1052)       [facility] SLP TX DAILY TX:   Dysarthria:  Speech intelligibility  Dysphagia:  Oral care  Patient/Caregiver Training:  Completed with patient and Communication strategies/modalities                                Outcome/Plan:     ST Narrative:  Pt seen for speech therapy. Oral care provided. Reviewed speech intelligibility strategy (SLOP-slow, loud, open mouth and pause). Pt did not utilize strategy during tx. ST not able to understand pt's speech. Pt did utilize gestures to communicate.  Speech Therapy Session Outcomes:  Tolerated treatment poorly Speech Therapy Plan of Care:  Continue with current plan of care          Pain Evaluation and Follow-up Pain Reassessment: 0, No pain (12/31/24 1052) Nursing notified of patient's pain assessment: Not indicated - pain score 2 or less (12/31/24 1052)    Therapy Minutes Time In : 1036 Time Out: 1050 Time calculation (min): 14 min          GRAVES MADDEN, LAUREN, CCC-SLP 12/31/2024       "

## 2024-12-31 NOTE — Progress Notes (Signed)
 "     PATIENT INFORMATION   Patient Name: Ricardo [name]  Date of Birth: 10/21/1951  MRN: [id number]   Date of Admission: 10/01/2024  5:53 PM Length of Stay: 91 Length of Stay: 91   Primary Care Physician: No primary care provider on file.  Attending Physician:VAN EYK, JASON J, MD    SUBJECTIVE   Chief Complaint:  Status post septic shock and acute respiratory failure.  Interval Summary:  Ricardo [name] is a 74 y.o. male that has been admitted to [facility].  The patient was admitted to [facility] from 08/03/2024 until 10/01/2024.  The patient was initially admitted to [facility] for UTI, septic shock, acute respiratory failure an absolute neutropenia.  He was subsequently transferred to [facility] medical center for anuric  renal failure in the setting of acute hypoxemic respiratory failure.  The patient underwent CRRT from 08/06/2024 and was transitioned to intermittent hemodialysis.  The patient's course at the outside facility was complicated by requiring high O2.  Patient was on BiPAP, high-flow nasal cannula.  The patient was intubated but self extubated on 09/01/2024.  The patient was intubated twice while at the outside facility.  The patient has stated he did not want tracheostomy placed so he changed his status to DNR/DNI.  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  While at the outside facility patient was diagnosed with ESBL bacteremia and Enterococcus faecalis peritonitis.  He was started on ertapenem, for a total of 2 weeks to end on 10/05/2024.  09/28/2024 patient underwent paracentesis with removal of 1.8 L. thirty fluid was positive for SPB ,cultures were positive for Enterococcus faecalis and patient was discharged on IV ampicillin and Rocephin  to end on 10/07/2021  Subjective:  The patient remains on room air.  He is alert and oriented times 2-3.  He remains on PEG tube feeds at this time.  He is having  some clots coming from his penis today.    OBJECTIVE   Objective  Vital Signs:  BP 122/58   Pulse 90   Temp 100 F (37.8 C) (Axillary)   Resp 20   Ht 6' 1 (1.854 m)   Wt 114 lb 3.2 oz (51.8 kg)   SpO2 93%   BMI 15.07 kg/m    I/O last 24 Hours:  Intake/Output Summary (Last 24 hours) at 12/31/2024 1531 Last data filed at 12/31/2024 1300 Gross per 24 hour  Intake 2160 ml  Output 2000 ml  Net 160 ml     Physical Exam:  Elderly cachectic appearing gentleman lying in bed awake  in no obvious distress HEENT:  Normocephalic atraumatic, PERRLA EOMs intact nares  patent, oropharynx clear Neck : tracheostomy  midline, no secretions  Cardiovascular : S1-S2 no murmurs rubs or gallops Lungs:  Decreased breath sounds in the bases bilateral Abdomen: positive bowel sounds soft peg intact Extremities: no clubbing cyanosis or edema  Neuro: awake alert and talking  Labs:   No new labs.  Xrays:  KUB 12/12/2024:  Mild-to-moderate stool burden with no abnormal bowel dilation.  MRI pelvis 12/10/2024:  Midline wound posterior to the sacrum and coccyx extending deep to the level of the sacrococcygeal junction with gas extending through the defect at the level of the sacrococcygeal junction and associated marrow signal abnormality, compatible with osteomyelitis. No abscess.  8.0 x 6.0 x 3.4 cm intramuscular hematoma within the right iliacus muscle.  Diffuse body wall anasarca and intramuscular edema.  CXR 12/10/2024:  Minimal bibasilar subsegmental atelectasis.  CT abd/pelvis 12/08/2024:  There is a deep wound midline inferior to the coccyx with findings concerning for diskitis/osteomyelitis assess sacrococcygeal junction.  There was no organized fluid collection.  CXR 10/20/2024:  Mild interstitial edema with bilateral pleural effusions and bibasilar atelectasis.     ASSESSMENT AND PLAN   Ricardo [name] is a 74 y.o. male  who was admitted on 10/01/2024 with Acute kidney injury due to  sepsis [N17.9, A41.9] .    Assessment/Plan:  ESBL bacteremia.  The patient has completed a course of ertapenem for his bacteremia.  Repeat blood cultures on 10/04/2024 were negative.    Enterococcus faecalis peritonitis.  The patient completed a course of IV ampicillin and Rocephin  for his peritonitis on 10/14/2024.   We will continue to monitor his white blood cell count and fever curve.  He was noted to have some abdominal distention where he was sent to IR where he had a abdominal ultrasound done 11/18/2024 and there was no significant ascites by ultrasound.    Acute kidney injury currently on hemodialysis.  He went into acute renal failure due to septic shock with ATN.  The patient is status post right tunneled HD catheter placement on 10/28/2024.  Nephrology is currently following and we will continue on dialysis on a M/W/F schedule.   UTI/sepsis.  The patient is now having low-grade temp on 12/06/2024 and has recurrence of his hematuria.  His UA showed a probable urinary tract infection where I ordered a repeat urine culture on 12/06/2024.  He had a UTI that was treated last month that showed ESBL Klebsiella pneumoniae.  He was started on empiric IV meropenem  based on those sensitivities.  Nursing was unable to obtain a urine culture before we started him on his antibiotics.  He had continued fever until 12/10/2024 where blood cultures as well as a chest x-ray were obtained.  His chest x-ray was clear and his blood cultures are currently negative.  I also broadened out his antibiotics to IV vancomycin  for Gram positive coverage.  Unfortunately, the patient went into worsening sepsis on the night of 12/11/2024 where he was given a normal saline bolus and started on Levophed.  He is stabilized in his Levophed was soon after discontinued.  He completed a course of meropenem  for UTI on 12/26/2024.    Hematuria.  He was having hematuria with clots where I sent him to the [name] for Urology  consultation on 12/19/2024.  The ER attending is no states that they called Urology and they did not come see the patient but recommended to follow up as an outpatient and there was nothing to do right now for the hematuria.  We could consider doing bladder irrigation if he continues to have hematuria.  If his hemoglobin drops again tomorrow, I am going to have him sent to urology for evaluation.  Possible dental abscess.  He has poor dentition and a possible dental abscess with pain in his left jaw which was treated.  Acute metabolic encephalopathy.  He developed encephalopathy with the acute delirium at the outside hospital.  He is currently on sertraline  and trazodone  at this time.  His mental status had improved but his daughter states today on 12/21/2024 he has not been sleeping well and has some confusion that is mild.  I started him on Xanax PRN at nighttime for sleep.  He will continue his current dose of trazodone  for sleep.  Myelofibrosis/anemia.  He has some anemia andthrombocytopenia but  at baseline, he does not have leukocytosis.  We will transfuse him as needed.  They have started him on Micera for his anemia of chronic kidney disease.  He had a negative stool guaiac on 10/05/2024.  His iron studies show a low iron level.   His anemia is probably related to his myelofibrosis and chronic kidney disease.  He has required frequent transfusions during his stay here at [facility].  He was last transfused on 12/28/2024.  We will see what his blood counts are doing tomorrow.  Hypothyroidism.  He remains on levothyroxine  at this time.  Paraphimosis.  He was seen by Urology in 08/2024.  A Foley was placed and was then removed on 09/25/2024.  He currently does not have a Foley catheter.  Atrial fibrillation/sinus tachycardia.  He seems to be rate controlled at this time.  He is on amiodarone as well as metoprolol .  His Aspirin  held due to severe anemia.  The patient has had some sinus tachycardia  where we increased his metoprolol  to 25 mg t.i.d..  We will continue to monitor with telemetry.  His sinus tachycardia has been intermittent and we will continue his current dosing.  We increased his amiodarone from 100 mg to 200 mg daily but he still has some sinus tachycardia.  Cardiology was consulted he is on Cardizem  CD 360 mg daily and metoprolol  50 mg b.i.d. which was controlling his heart rate.  The patient went back into sinus tachycardia on 12/11/2024 where I have loaded him with amiodarone and IV digoxin.  His Levophed has been discontinued and now his sinus tachycardia is resolved.  I discontinued his digoxin.  We will continue with telemetry.  Wounds/osteomyelitis.  The patient has an unstageable sacral pressure ulcer as well as a deep tissue injury to his left ear and his nose.  Wound Care is following.  We will continue with the Tylenol  and oxycodone  PRN.  Wound Care felt that his wound looked infected on 12/08/2024 where we obtain a CT scan of his abdomen and pelvis on 12/08/2024 that showed a deep wound midline inferior to the coccyx with findings concerning for diskitis/osteomyelitis at the sacrococcygeal junction.  His MRI of his pelvis on 12/10/2024 shows osteomyelitis at the sacrococcygeal junction.  He is currently on IV meropenem  and IV vancomycin .  Wound care called me into the room on 12/15/2024 where over the last 4-5 days they state that his sacral wound has worsened.  I felt that he need a possible wound debridement and he also has a infection around his wound.  He was sent to see the general surgeon in [facility] ER on 12/19/2024 and they felt there was no need for emergent debridement and they recommended to wet-to-dry dressing to central area and offload as possible.  I will continue him on meropenem  and IV linezolid  for his wound infection and had a discussion with pharmacy where the patient will complete 6 weeks of antibiotics with the completion date on 01/30/2025.  The wound care  physician saw him on 12/25/2024 and did a bedside debridement.  We will continue his current wound care and antibiotics.  Severe protein calorie malnutrition/dysphagia.  He is status post PEG tube placement.  He has had a PEG tube malfunction where  Interventional Radiology replaced his PEG tube on 11/11/2024.  He has had some complication of some bleeding around his PEG tube site where Interventional re-evaluated him.  He has not had any further bleeding from around his PEG tube site.  He is currently tolerating tube feeds well.  The dietitian will continue to follow along.  Foot pain.  He is probably having some neuropathy since he is complaining of pain in both feet. started on some low-dose gabapentin  100 mg at night.  Debility.  PT and OT therapies are currently following.  Prevention.  Heparin  for DVT prophylaxis held (due to severe anemia) and omeprazole for GI prophylaxis.  Dispo.  The patient will need skilled nursing facility placement.  He was discussed in our multidisciplinary team meeting on 12/30/2024 and he is going to need out of state placement due to him not being able to sit up for dialysis.     Code Status:  Limited   Spent a total of 33 minutes on this encounter. This includes reviewing patient's extensive history, assessment and visit with the patient as well as documentation time. Time spent also includes IDT collaboration.    SIGNATURE   Electronically signed:  VAN EYK, JASON J, MD  12/31/2024 at 3:31 PM EST      "

## 2024-12-31 NOTE — Progress Notes (Signed)
 " [facility]  ROUNDING NOTE   Subjective:   Patient last underwent hemodialysis treatment yesterday.   UF achieved was 2 kg.   Resting comfortably in bed.   Objective:  Vital signs in last 24 hours:  Temp:  [98.1 F (36.7 C)-100 F (37.8 C)] 100 F (37.8 C) Pulse:  [83-91] 87 Resp:  [19-24] 19 BP: (109-129)/(49-60) 117/53 FiO2 (%):  [21 %] 21 %    Intake/Output: I/O last 3 completed shifts: In: 2100 [P.O.:240; NG/GT:1860] Out: 2000    Intake/Output this shift:  I/O this shift: In: 300 [P.O.:300] Out: -   Physical Exam: General: No acute distress  Head: Temporal wasting. Moist oral mucosal membranes  Neck: Supple  Lungs:  Scattered rhonchi, normal effort  Heart: S1S2 no rubs  Abdomen:  Soft, nontender, bowel sounds present  Extremities: No peripheral edema.  Neurologic: Awake, alert, following commands  Skin: No acute rash  Access: Right IJ dialysis catheter    Basic Metabolic Panel: Recent Labs  Lab Units 12/27/24 0000  SERUM CREATININE  2.53       Imaging: @[id number] @   Medications:       Scheduled/Continuous Medications     Scheduled         dilTIAZem  (CARDIZEM ) tablet 90 mg  90 mg, Tube, Q8H SCH     gabapentin  (NEURONTIN ) capsule 100 mg  100 mg, Tube, Nightly     heparin  (porcine) injection 3,800 Units  3,800 Units, IK, TThSat w/ Dialysis     insulin lispro injection 0-12 Units (And Linked Group #1)  0-12 Units, [place], Q6H SCH     latanoprost  (XALATAN ) 0.005 % ophthalmic solution 1 drop  1 drop, Both Eyes, Nightly     levothyroxine  (SYNTHROID ) tablet 25 mcg  25 mcg, Tube, Daily at 0600     linezolid  (ZYVOX ) tablet 600 mg  600 mg, PO, BID     meropenem  (MERREM ) 500 mg in sterile water  IV syringe  500 mg, IV, Q24H     methoxy peg-epoetin beta (MICERA) injection 100 mcg  100 mcg, IV, Q14 Days     metoprolol  tartrate (LOPRESSOR ) tablet 50 mg  50 mg, Tube, BID     midodrine  (PROAMATINE ) tablet 10 mg  10 mg,  PO/Per Tube, TID     Omeprazole ODT (Prilosec OTC) disintegrating tablet 40 mg  40 mg, PO/Per Tube, Once a day     polyethylene glycol (MIRALAX ) packet 17 g  17 g, Tube, Once a day     senna (SENOKOT) tablet 8.6 mg  8.6 mg, Tube, Nightly     sertraline  (ZOLOFT ) tablet 50 mg  50 mg, Tube, Once a day     sodium hypochlorite 0.25% (DAKINS HALF-STRENGTH) topical solution  No Dose/Rate, TOP, Once a day     traZODone  (DESYREL ) tablet 100 mg  100 mg, PO/Per Tube, Nightly     vitamin B-12 (CYANOCOBALAMIN ) tablet 1,000 mcg  1,000 mcg, Tube, Once a day               glucose **OR** juice or non-diet carbonated beverage   acetaminophen    ALPRAZolam   dextrose  **OR** dextrose    Erythropoiesis-Stimulating Agent (ESA) per policy   glucagon   melatonin   oxyCODONE    sodium chloride  infusion  Assessment/ Plan:  74 y.o. male with a PMHx of prostate cancer, COPD with emphysema, chronic hypoxemic respiratory failure, atrial fibrillation, severe protein calorie malnutrition, myelofibrosis, GERD, anxiety, gout, ESBL bacteremia, ileus, acute kidney injury secondary to septic ATN, acute metabolic  encephalopathy, myelofibrosis with anemia, hypothyroidism, paraphimosis who was admitted to [facility] on 10/01/2024 for ongoing care.      1.  Acute kidney injury likely secondary to septic/ischemic ATN with baseline creatinine of 1.1.  Patient remains dialysis dependent at this time.  He underwent hemodialysis treatment yesterday with UF achieved was 2 kg.  Next dialysis treatment scheduled for tomorrow.   2. Anemia of chronic kidney disease.  Patient with known history of myelofibrosis.  Patient underwent blood transfusion 10/18/2024, 10/22/2024 11/03/2024, 11/07/2024, 11/12/24, 11/19/24, 11/24/24, 11/30/2024, 12/06/2024, 12/11/2024, 12/16/2024, 12/23/24, 12/27/2024.  Hemoglobin yesterday was 7.8.  Continues to require frequent blood transfusions due to myelofibrosis.  3. Secondary  hyperparathyroidism.  Phosphorus 2.8 and acceptable.   LOS: 91 LATEEF, BONNELL SAILOR, MD 1/14/20266:21 PM EST   "

## 2025-01-01 ENCOUNTER — Other Ambulatory Visit: Payer: Self-pay

## 2025-01-01 ENCOUNTER — Telehealth (HOSPITAL_COMMUNITY): Payer: Self-pay | Admitting: Emergency Medicine

## 2025-01-01 ENCOUNTER — Inpatient Hospital Stay (HOSPITAL_COMMUNITY)
Admission: EM | Admit: 2025-01-01 | Discharge: 2025-01-18 | DRG: 393 | Disposition: E | Source: Skilled Nursing Facility | Attending: Family Medicine | Admitting: Family Medicine

## 2025-01-01 ENCOUNTER — Other Ambulatory Visit (HOSPITAL_COMMUNITY)

## 2025-01-01 DIAGNOSIS — D62 Acute posthemorrhagic anemia: Secondary | ICD-10-CM | POA: Diagnosis present

## 2025-01-01 DIAGNOSIS — R7881 Bacteremia: Secondary | ICD-10-CM | POA: Insufficient documentation

## 2025-01-01 DIAGNOSIS — G629 Polyneuropathy, unspecified: Secondary | ICD-10-CM | POA: Diagnosis present

## 2025-01-01 DIAGNOSIS — Z8249 Family history of ischemic heart disease and other diseases of the circulatory system: Secondary | ICD-10-CM

## 2025-01-01 DIAGNOSIS — E43 Unspecified severe protein-calorie malnutrition: Secondary | ICD-10-CM | POA: Diagnosis present

## 2025-01-01 DIAGNOSIS — B965 Pseudomonas (aeruginosa) (mallei) (pseudomallei) as the cause of diseases classified elsewhere: Secondary | ICD-10-CM | POA: Insufficient documentation

## 2025-01-01 DIAGNOSIS — R338 Other retention of urine: Secondary | ICD-10-CM | POA: Diagnosis present

## 2025-01-01 DIAGNOSIS — M109 Gout, unspecified: Secondary | ICD-10-CM | POA: Diagnosis present

## 2025-01-01 DIAGNOSIS — Z923 Personal history of irradiation: Secondary | ICD-10-CM

## 2025-01-01 DIAGNOSIS — Z992 Dependence on renal dialysis: Secondary | ICD-10-CM

## 2025-01-01 DIAGNOSIS — G8929 Other chronic pain: Secondary | ICD-10-CM | POA: Diagnosis present

## 2025-01-01 DIAGNOSIS — N368 Other specified disorders of urethra: Secondary | ICD-10-CM | POA: Diagnosis present

## 2025-01-01 DIAGNOSIS — R636 Underweight: Secondary | ICD-10-CM

## 2025-01-01 DIAGNOSIS — Z79899 Other long term (current) drug therapy: Secondary | ICD-10-CM

## 2025-01-01 DIAGNOSIS — Y842 Radiological procedure and radiotherapy as the cause of abnormal reaction of the patient, or of later complication, without mention of misadventure at the time of the procedure: Secondary | ICD-10-CM | POA: Diagnosis present

## 2025-01-01 DIAGNOSIS — Z515 Encounter for palliative care: Secondary | ICD-10-CM

## 2025-01-01 DIAGNOSIS — R319 Hematuria, unspecified: Secondary | ICD-10-CM

## 2025-01-01 DIAGNOSIS — M8660 Other chronic osteomyelitis, unspecified site: Secondary | ICD-10-CM | POA: Insufficient documentation

## 2025-01-01 DIAGNOSIS — E039 Hypothyroidism, unspecified: Secondary | ICD-10-CM | POA: Diagnosis present

## 2025-01-01 DIAGNOSIS — D471 Chronic myeloproliferative disease: Secondary | ICD-10-CM | POA: Diagnosis present

## 2025-01-01 DIAGNOSIS — D631 Anemia in chronic kidney disease: Secondary | ICD-10-CM | POA: Diagnosis present

## 2025-01-01 DIAGNOSIS — R64 Cachexia: Secondary | ICD-10-CM | POA: Diagnosis present

## 2025-01-01 DIAGNOSIS — N2581 Secondary hyperparathyroidism of renal origin: Secondary | ICD-10-CM | POA: Diagnosis present

## 2025-01-01 DIAGNOSIS — N472 Paraphimosis: Secondary | ICD-10-CM | POA: Diagnosis present

## 2025-01-01 DIAGNOSIS — M4628 Osteomyelitis of vertebra, sacral and sacrococcygeal region: Secondary | ICD-10-CM | POA: Diagnosis present

## 2025-01-01 DIAGNOSIS — N186 End stage renal disease: Secondary | ICD-10-CM | POA: Diagnosis present

## 2025-01-01 DIAGNOSIS — Z7401 Bed confinement status: Secondary | ICD-10-CM

## 2025-01-01 DIAGNOSIS — N3041 Irradiation cystitis with hematuria: Secondary | ICD-10-CM | POA: Diagnosis present

## 2025-01-01 DIAGNOSIS — Z681 Body mass index (BMI) 19 or less, adult: Secondary | ICD-10-CM

## 2025-01-01 DIAGNOSIS — Z9079 Acquired absence of other genital organ(s): Secondary | ICD-10-CM

## 2025-01-01 DIAGNOSIS — Z66 Do not resuscitate: Secondary | ICD-10-CM | POA: Diagnosis present

## 2025-01-01 DIAGNOSIS — D696 Thrombocytopenia, unspecified: Secondary | ICD-10-CM | POA: Diagnosis present

## 2025-01-01 DIAGNOSIS — D649 Anemia, unspecified: Secondary | ICD-10-CM

## 2025-01-01 DIAGNOSIS — N1330 Unspecified hydronephrosis: Secondary | ICD-10-CM | POA: Diagnosis present

## 2025-01-01 DIAGNOSIS — R627 Adult failure to thrive: Secondary | ICD-10-CM | POA: Diagnosis present

## 2025-01-01 DIAGNOSIS — K559 Vascular disorder of intestine, unspecified: Principal | ICD-10-CM | POA: Diagnosis present

## 2025-01-01 DIAGNOSIS — I4891 Unspecified atrial fibrillation: Secondary | ICD-10-CM | POA: Diagnosis present

## 2025-01-01 DIAGNOSIS — Z79890 Hormone replacement therapy (postmenopausal): Secondary | ICD-10-CM

## 2025-01-01 DIAGNOSIS — J9611 Chronic respiratory failure with hypoxia: Secondary | ICD-10-CM | POA: Diagnosis present

## 2025-01-01 DIAGNOSIS — N179 Acute kidney failure, unspecified: Secondary | ICD-10-CM | POA: Diagnosis present

## 2025-01-01 DIAGNOSIS — K922 Gastrointestinal hemorrhage, unspecified: Principal | ICD-10-CM | POA: Diagnosis present

## 2025-01-01 DIAGNOSIS — R532 Functional quadriplegia: Secondary | ICD-10-CM | POA: Diagnosis present

## 2025-01-01 DIAGNOSIS — T8389XA Other specified complication of genitourinary prosthetic devices, implants and grafts, initial encounter: Secondary | ICD-10-CM | POA: Diagnosis present

## 2025-01-01 DIAGNOSIS — I959 Hypotension, unspecified: Secondary | ICD-10-CM | POA: Diagnosis present

## 2025-01-01 DIAGNOSIS — Z789 Other specified health status: Secondary | ICD-10-CM

## 2025-01-01 DIAGNOSIS — J439 Emphysema, unspecified: Secondary | ICD-10-CM | POA: Diagnosis present

## 2025-01-01 DIAGNOSIS — N320 Bladder-neck obstruction: Secondary | ICD-10-CM | POA: Diagnosis present

## 2025-01-01 DIAGNOSIS — L89154 Pressure ulcer of sacral region, stage 4: Secondary | ICD-10-CM | POA: Diagnosis present

## 2025-01-01 DIAGNOSIS — E875 Hyperkalemia: Secondary | ICD-10-CM | POA: Diagnosis present

## 2025-01-01 DIAGNOSIS — Z8546 Personal history of malignant neoplasm of prostate: Secondary | ICD-10-CM

## 2025-01-01 DIAGNOSIS — Z931 Gastrostomy status: Secondary | ICD-10-CM

## 2025-01-01 DIAGNOSIS — L89150 Pressure ulcer of sacral region, unstageable: Secondary | ICD-10-CM | POA: Diagnosis present

## 2025-01-01 DIAGNOSIS — K766 Portal hypertension: Secondary | ICD-10-CM | POA: Diagnosis present

## 2025-01-01 LAB — I-STAT CHEM 8, ED
BUN: 35 mg/dL — ABNORMAL HIGH (ref 8–23)
Calcium, Ion: 1.03 mmol/L — ABNORMAL LOW (ref 1.15–1.40)
Chloride: 100 mmol/L (ref 98–111)
Creatinine, Ser: 1.4 mg/dL — ABNORMAL HIGH (ref 0.61–1.24)
Glucose, Bld: 109 mg/dL — ABNORMAL HIGH (ref 70–99)
HCT: 21 % — ABNORMAL LOW (ref 39.0–52.0)
Hemoglobin: 7.1 g/dL — ABNORMAL LOW (ref 13.0–17.0)
Potassium: 3.6 mmol/L (ref 3.5–5.1)
Sodium: 137 mmol/L (ref 135–145)
TCO2: 28 mmol/L (ref 22–32)

## 2025-01-01 LAB — RENAL FUNCTION PANEL
Albumin: 2.6 g/dL — ABNORMAL LOW (ref 3.5–5.0)
Anion gap: 12 (ref 5–15)
BUN: 88 mg/dL — ABNORMAL HIGH (ref 8–23)
CO2: 26 mmol/L (ref 22–32)
Calcium: 9.4 mg/dL (ref 8.9–10.3)
Chloride: 95 mmol/L — ABNORMAL LOW (ref 98–111)
Creatinine, Ser: 2.79 mg/dL — ABNORMAL HIGH (ref 0.61–1.24)
GFR, Estimated: 23 mL/min — ABNORMAL LOW
Glucose, Bld: 109 mg/dL — ABNORMAL HIGH (ref 70–99)
Phosphorus: 2.5 mg/dL (ref 2.5–4.6)
Potassium: 4.8 mmol/L (ref 3.5–5.1)
Sodium: 134 mmol/L — ABNORMAL LOW (ref 135–145)

## 2025-01-01 LAB — CBC WITH DIFFERENTIAL/PLATELET
Abs Immature Granulocytes: 0.3 K/uL — ABNORMAL HIGH (ref 0.00–0.07)
Abs Immature Granulocytes: 0.6 K/uL — ABNORMAL HIGH (ref 0.00–0.07)
Band Neutrophils: 6 %
Basophils Absolute: 0.2 K/uL — ABNORMAL HIGH (ref 0.0–0.1)
Basophils Absolute: 0.4 K/uL — ABNORMAL HIGH (ref 0.0–0.1)
Basophils Relative: 2 %
Basophils Relative: 4 %
Eosinophils Absolute: 0.2 K/uL (ref 0.0–0.5)
Eosinophils Absolute: 0.1 K/uL (ref 0.0–0.5)
Eosinophils Relative: 3 %
Eosinophils Relative: 1 %
HCT: 16.6 % — ABNORMAL LOW (ref 39.0–52.0)
HCT: 24.3 % — ABNORMAL LOW (ref 39.0–52.0)
Hemoglobin: 5.5 g/dL — CL (ref 13.0–17.0)
Hemoglobin: 8 g/dL — ABNORMAL LOW (ref 13.0–17.0)
Immature Granulocytes: 4 %
Lymphocytes Relative: 17 %
Lymphocytes Relative: 7 %
Lymphs Abs: 1.2 K/uL (ref 0.7–4.0)
Lymphs Abs: 0.7 K/uL (ref 0.7–4.0)
MCH: 31.3 pg (ref 26.0–34.0)
MCH: 31 pg (ref 26.0–34.0)
MCHC: 33.1 g/dL (ref 30.0–36.0)
MCHC: 32.9 g/dL (ref 30.0–36.0)
MCV: 94.3 fL (ref 80.0–100.0)
MCV: 94.2 fL (ref 80.0–100.0)
Metamyelocytes Relative: 2 %
Monocytes Absolute: 0.6 K/uL (ref 0.1–1.0)
Monocytes Absolute: 0.1 K/uL (ref 0.1–1.0)
Monocytes Relative: 9 %
Monocytes Relative: 1 %
Myelocytes: 4 %
Neutro Abs: 4.6 K/uL (ref 1.7–7.7)
Neutro Abs: 8.7 K/uL — ABNORMAL HIGH (ref 1.7–7.7)
Neutrophils Relative %: 65 %
Neutrophils Relative %: 75 %
Platelets: 73 K/uL — ABNORMAL LOW (ref 150–400)
Platelets: 74 K/uL — ABNORMAL LOW (ref 150–400)
RBC: 1.76 MIL/uL — ABNORMAL LOW (ref 4.22–5.81)
RBC: 2.58 MIL/uL — ABNORMAL LOW (ref 4.22–5.81)
RDW: 17.2 % — ABNORMAL HIGH (ref 11.5–15.5)
RDW: 16.9 % — ABNORMAL HIGH (ref 11.5–15.5)
Smear Review: NORMAL
Smear Review: NORMAL
WBC: 7.1 K/uL (ref 4.0–10.5)
WBC: 10.7 K/uL — ABNORMAL HIGH (ref 4.0–10.5)
nRBC: 0.6 % — ABNORMAL HIGH (ref 0.0–0.2)
nRBC: 0.6 % — ABNORMAL HIGH (ref 0.0–0.2)

## 2025-01-01 LAB — COMPREHENSIVE METABOLIC PANEL WITH GFR
ALT: 32 U/L (ref 0–44)
AST: 54 U/L — ABNORMAL HIGH (ref 15–41)
Albumin: 2.5 g/dL — ABNORMAL LOW (ref 3.5–5.0)
Alkaline Phosphatase: 327 U/L — ABNORMAL HIGH (ref 38–126)
Anion gap: 13 (ref 5–15)
BUN: 32 mg/dL — ABNORMAL HIGH (ref 8–23)
CO2: 26 mmol/L (ref 22–32)
Calcium: 8.8 mg/dL — ABNORMAL LOW (ref 8.9–10.3)
Chloride: 98 mmol/L (ref 98–111)
Creatinine, Ser: 1.33 mg/dL — ABNORMAL HIGH (ref 0.61–1.24)
GFR, Estimated: 57 mL/min — ABNORMAL LOW
Glucose, Bld: 105 mg/dL — ABNORMAL HIGH (ref 70–99)
Potassium: 3.7 mmol/L (ref 3.5–5.1)
Sodium: 137 mmol/L (ref 135–145)
Total Bilirubin: 0.5 mg/dL (ref 0.0–1.2)
Total Protein: 7.4 g/dL (ref 6.5–8.1)

## 2025-01-01 LAB — PREPARE RBC (CROSSMATCH)

## 2025-01-01 LAB — POC OCCULT BLOOD, ED: Fecal Occult Bld: POSITIVE — AB

## 2025-01-01 MED ORDER — ACETAMINOPHEN 500 MG PO TABS: 1000.0000 mg | ORAL_TABLET | Freq: Four times a day (QID) | ORAL | Status: DC | PRN

## 2025-01-01 MED ORDER — SODIUM CHLORIDE 0.9% IV SOLUTION: Freq: Once | INTRAVENOUS | Status: AC

## 2025-01-01 MED ORDER — LEVOTHYROXINE SODIUM 25 MCG PO TABS
25.0000 ug | ORAL_TABLET | Freq: Every day | ORAL | Status: DC
  Administered 2025-01-02 – 2025-01-11 (×9): 25 ug
  Filled 2025-01-01 (×10): qty 1

## 2025-01-01 MED ORDER — ACETAMINOPHEN 650 MG RE SUPP: 650.0000 mg | Freq: Four times a day (QID) | RECTAL | Status: DC | PRN

## 2025-01-01 MED ORDER — ACETAMINOPHEN 500 MG PO TABS
1000.0000 mg | ORAL_TABLET | Freq: Four times a day (QID) | ORAL | Status: DC | PRN
  Administered 2025-01-02 – 2025-01-03 (×2): 1000 mg
  Filled 2025-01-01 (×2): qty 2

## 2025-01-01 MED ORDER — DILTIAZEM HCL 90 MG PO TABS
90.0000 mg | ORAL_TABLET | Freq: Three times a day (TID) | ORAL | Status: DC
  Administered 2025-01-02 – 2025-01-05 (×11): 90 mg
  Filled 2025-01-01 (×15): qty 1

## 2025-01-01 MED ORDER — METOPROLOL TARTRATE 50 MG PO TABS
50.0000 mg | ORAL_TABLET | Freq: Two times a day (BID) | ORAL | Status: DC
  Administered 2025-01-02 – 2025-01-05 (×7): 50 mg
  Filled 2025-01-01 (×5): qty 1
  Filled 2025-01-01: qty 2
  Filled 2025-01-01: qty 1

## 2025-01-01 MED ORDER — GABAPENTIN 250 MG/5ML PO SOLN
100.0000 mg | Freq: Every day | ORAL | Status: DC
  Administered 2025-01-02 – 2025-01-10 (×10): 100 mg
  Filled 2025-01-01 (×12): qty 2

## 2025-01-01 MED ORDER — FREE WATER
30.0000 mL | Status: DC
  Administered 2025-01-02 – 2025-01-11 (×101): 30 mL

## 2025-01-01 MED ORDER — LATANOPROST 0.005 % OP SOLN
1.0000 [drp] | Freq: Every day | OPHTHALMIC | Status: DC
  Administered 2025-01-02 – 2025-01-10 (×9): 1 [drp] via OPHTHALMIC
  Filled 2025-01-01: qty 2.5

## 2025-01-01 MED ORDER — OXYCODONE HCL 5 MG PO TABS: 5.0000 mg | ORAL_TABLET | Freq: Four times a day (QID) | ORAL | Status: DC | PRN

## 2025-01-01 MED ORDER — MIDODRINE HCL 5 MG PO TABS
10.0000 mg | ORAL_TABLET | Freq: Three times a day (TID) | ORAL | Status: DC
  Administered 2025-01-02 – 2025-01-10 (×25): 10 mg
  Filled 2025-01-01 (×24): qty 2

## 2025-01-01 MED ORDER — PANTOPRAZOLE SODIUM 40 MG IV SOLR
40.0000 mg | INTRAVENOUS | Status: DC
  Administered 2025-01-02 – 2025-01-04 (×3): 40 mg via INTRAVENOUS
  Filled 2025-01-01 (×3): qty 10

## 2025-01-01 MED ORDER — TRAZODONE HCL 50 MG PO TABS
100.0000 mg | ORAL_TABLET | Freq: Every day | ORAL | Status: DC
  Administered 2025-01-02: 100 mg
  Filled 2025-01-01: qty 2

## 2025-01-01 MED ORDER — LINEZOLID 600 MG/300ML IV SOLN
600.0000 mg | Freq: Two times a day (BID) | INTRAVENOUS | Status: DC
  Administered 2025-01-01 – 2025-01-02 (×2): 600 mg via INTRAVENOUS
  Filled 2025-01-01 (×2): qty 300

## 2025-01-01 MED ORDER — SERTRALINE HCL 50 MG PO TABS
50.0000 mg | ORAL_TABLET | Freq: Every day | ORAL | Status: DC
  Administered 2025-01-02 – 2025-01-10 (×9): 50 mg
  Filled 2025-01-01 (×9): qty 1

## 2025-01-01 MED ORDER — PANTOPRAZOLE SODIUM 40 MG IV SOLR
80.0000 mg | Freq: Once | INTRAVENOUS | Status: AC
  Administered 2025-01-01: 80 mg via INTRAVENOUS
  Filled 2025-01-01: qty 20

## 2025-01-01 MED ORDER — SODIUM CHLORIDE 0.9 % IV SOLN
500.0000 mg | Freq: Every day | INTRAVENOUS | Status: DC
  Administered 2025-01-02: 500 mg via INTRAVENOUS
  Filled 2025-01-01: qty 10

## 2025-01-01 MED ORDER — SODIUM CHLORIDE 0.9 % IV SOLN
500.0000 mg | Freq: Every day | INTRAVENOUS | Status: DC
  Filled 2025-01-01: qty 10

## 2025-01-01 NOTE — Assessment & Plan Note (Addendum)
 T/Th/Sat HD schedule.  - Consult nephrology

## 2025-01-01 NOTE — Assessment & Plan Note (Addendum)
 Chronic sacral pressure ulcer/osteomyelitis. IV abx started at [facility] for a 6 week course of dual therapy ending 01/30/2025. - Continue IV meropenum 500mg  and IV linezolid  600mg  with end date 02/13 - Offloading with PT/OT - Consider alternating pressure mattress - Wound care consulted, appreciate care

## 2025-01-01 NOTE — ED Triage Notes (Signed)
 Pt to the ed from [facility] floor. Via ems with a CC of BI bleed with bloody urine x12 hours. Pt had a hgb of 5 this morning and has received 2 units PRBC.

## 2025-01-01 NOTE — Unmapped External Note (Signed)
" °  Problem: Malnutrition Description: Inadequate intake of protein and/or energy sufficient to negatively impact growth/development, and/or result in loss of fat or muscle stores.  Related to: Interpretation of weight loss from baseline, Body fat loss, and Muscle mass loss As evidenced by: Severe muscle mass and body fat loss, 27% loss of body weight over 3 months Goal: Tolerate Enteral Nutrition Outcome: Progressing Flowsheets Taken 12/25/2024 1617 by Heron CHARM Resides, RD Medical Food Supplement Therapy: (Juven 1 unit pack bid ordered) Commercial food/Modular nutritional supplement Taken 10/02/2024 1218 by Camie KYM Parkinson, RD Collaboration and Referral of Nutrition Care: Collaboration with Interdisciplinary Team Enteral and Parenteral Nutrition: Enteral nutrition Vitamin and Mineral Supplement Therapy: (B12) Vitamin Indicator/Monitor: TF toleration, wt, labs, GI, skin   "

## 2025-01-01 NOTE — Assessment & Plan Note (Addendum)
 See HPI for detailed recent history of GI bleed and hospitalizations.  - Admit to [facility] progressive under Dr. McDiarmid - s/p 2u PRBC at dialysis and 1u PRBC in ED  - Transfuse as needed, transfusion threshold <7  - Repeat h&h 4 hours post transfusion - s/p 80mg  IV Protonix  in ED   - Continue home omeprazole 40mg  BID per tube with formulary IV Protonix  40mg  BID per tube - Pain control: Tylenol  1000mg  per tube q6h PRN for mild pain, oxycodone  5mg  per tube q6h PRN for moderate pain - GI consulted by EDP, appreciate recommendations  - clear liquid diet   - will evaluate 01/16 - PT/OT evaluation  - Fall precautions initiated - Delirium precautions initiated - AM RFP, CBC

## 2025-01-01 NOTE — Assessment & Plan Note (Addendum)
 Most recent RD diet orders 01/08 from [facility]:  -1 packet fiber supplement TID - 1 packet Juven BID - Strict NPO diet - Nepro tube feeding continuous rate 69mL/hr - Tube feeding water  flush 30mL every 2 hours; order placed  - HOLD tube feeds 1 hour before and after levothyroxine  - Consulted RD, appreciate recommendations

## 2025-01-01 NOTE — Nursing Note (Signed)
 Pt. Hemoglobin 5.5 Md aware and 1 unit transfuse with HD.Continues bladder irrigation ordered and discontinued by MD.

## 2025-01-01 NOTE — Nursing Note (Signed)
 Patient's daughter, Rayvon Dakin, is notified that her dad is transferred to [facility] ICU for GI consult.

## 2025-01-01 NOTE — Assessment & Plan Note (Addendum)
 H/o metabolic encephalopathy and insomnia: continue home trazodone  100mg  per tube at bedtime and home sertraline  50mg  per tube daily  Hypotension: continue home midodrine  10mg  per tube TID Hypothyroidism: continue home levothyroxine  per tube daily Neuropathy: continue gabapentin  100mg  per tube QHS

## 2025-01-01 NOTE — Telephone Encounter (Signed)
 Called by physician working at [facility].  They report that this 74 year old male will be transferred over for evaluation for GI bleed.  Has been treated for hematuria recently.  Recently underwent bladder irrigation.  This morning had a hemoglobin of 5.5.  Got 2 units of blood during dialysis today and then upon return had a large bloody bowel movement.  He is not on anticoagulation.  Currently is hemodynamically stable.

## 2025-01-01 NOTE — Hospital Course (Addendum)
 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 His hospital course is detailed below:  GI Bleed I Primary Myelofibrosis Patient presented to the ED from [facility] (prolonged stay for septic shock, GI bleed, hematuria) for severe anemia to 5.5 and BRBPR (+FOB).  Received 2 units PRBC at dialysis and 1 unit PRBC in the ED.  GI recommended clear liquids and IV PPI.  Patient received a total of 7 units of blood throughout admission. Patient developed cough on night of 1/24 and found to have psuedomonas in blood. Patient was started on cefepime  on 1/24. On AM of 02-03-2025, patient began to have agonal breathing and lost pulse.   Hematuria Known intermittent hematuria for ~6 months PTA, bloody urine in Foley seen in ED.  Urology consulted and replaced foley w/ bladder irrigation.   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  ESRD on dialysis  Initiated after last hospitalization d/t anuric renal failure 2/2 septic shock with ATN.  Patient resumed his T/Th/Sat dialysis schedule while inpatient per nephrology.

## 2025-01-01 NOTE — Unmapped External Note (Signed)
 Narrative Summary: Pt's hemoglobin dropped to 5.5, discussed with MD, hold PT. Follow up in the am. P: Continue with PT POC as tolerated.

## 2025-01-01 NOTE — Consults (Signed)
 Consult - Diarrhea Management  Patient: Maxten Shuler MRN#: [id number] Attending: Selinda JINNY Fleeta Valeria, Md [[id number]] Admission Date: [id number]  Significant Information: Bristol 7 stools x 4 (1/14). Small Bristol 6 stool this a.m.     Diet Orders  (From admission, onward)           Start     Ordered   12/25/24 1800  Fiber supplement Banatrol TF; 1 unit packet; Feeding tube  RD 3 times daily       End/Expires: Until Specified    Question Answer Comment  Select Supplement: Banatrol TF   Volume: 1 unit packet   Administration Route: Feeding tube      12/25/24 1632   12/25/24 1700  Nutritional supplement Juven; 1 unit pack; Feeding tube  2 times daily at breakfast and dinner       End/Expires: Until Specified    Question Answer Comment  Select Supplement: Juven   Volume: 1 unit pack   Administration Route: Feeding tube      12/25/24 1630   12/23/24 0839  NPO Diet, Tube Feeding No Tray Strict NPO; Nepro; Tube Feeding Continuous rate (mL/hr): 65; Tube Feeding water  flush (mL): 30; Water  Flush type: Water ; Water  flush frequency: Every 2 hours  Diet effective now       Comments: HOLD TF 1 hour before and after Levothyroxine   End/Expires: Until Specified    Question Answer Comment  NPO Status: Strict NPO   Tube Feeding Formula: Nepro   Tube Feeding Continuous rate (mL/hr): 65   Tube Feeding water  flush (mL): 30   Water  Flush type: Water    Water  flush frequency: Every 2 hours   Place order in third party system. Done      12/23/24 0839            Intake & Output last 3 days                            12/30/24 0700 - 12/31/24 0659 12/31/24 0700 - 01/01/25 0659 01/01/25 0700 - 01/02/25 0659    9299-8140 1900-0659 Total 0700-1859 1900-0659 Total 0700-1859 1900-0659 Total                 Intake   P.O.  240  -- 240  300  -- 300  15  -- 15   I.V.  --  -- --  --  10 10  --  -- --   NG/GT  900  960 1860  --  802 802  --  -- --   IV Piggyback  --  -- --  --  10 10  --  -- --    Total Intake 1140 960 2100 [phone] 15 -- 15     Output   Dialysis  2000  -- 2000  --  -- --  --  -- --   Total Output 2000 -- 2000 -- -- -- -- -- --     Net I/O    -860 960 100 [phone] 15 -- 15       Review of Weight:   Admit Weight: 111 lb 15.9 oz (50.8 kg) (10/01/24 0200)   Latest Weight: 114 lb 3.2 oz (51.8 kg) (12/31/24 0444)   Nutrition Intake Current Average Meal Intake (%):  (NPO)  Recent Stool Output: Ostomy Stool Output 12/30/24 0700 - 12/30/24 1859 12/30/24 1900 - 12/31/24 0659 12/31/24 0700 - 12/31/24 1859 12/31/24 1900 - 01/01/25 9340  01/01/25 0700 - 01/01/25 1024  Patient has no LDAs of requested type attached.   Stool Output       12/31/2024 1000 12/31/2024 1400 12/31/2024 2000 01/01/2025 0000 01/01/2025 0904   Stool Appearance: Bristol 7 - Liquid consistency with no solid pieces Bristol 7 - Liquid consistency with no solid pieces Bristol 7 - Liquid consistency with no solid pieces Bristol 6 - Mushy consistency with ragged edges Bristol 6 - Mushy consistency with ragged edges;Bloody   Stool Amount Estimated: Large Large Large Medium Small       Relevant Labs: Glucose Last 3 Days:  Glucose Results Last 3 Days  (Last 12 results in the past 72 hours)        Glucose     01/01/25 0015 126     12/31/24 1708 125     12/31/24 1105 119     12/31/24 0516 128     12/30/24 2315 127     12/30/24 1947 140     12/30/24 1746 138     12/30/24 1454 112     12/30/24 0629 127     12/30/24 0407 116     12/29/24 1609 131     12/29/24 1114 150         Medication review:  Insulin Lispro, Levothyroxine , Prilosec OTC, Miralax , Senna, Vitamin B 12  Hydration: Continue water  flushes every 2 hours  Diet/EN evaluation/changes: Continue Nepro @ 65 ml x 22; Juven 1 unit bid  Recommendations: Discontinue  medications: Miralax ; Senna  LILES, BARBARA D., RD 01/01/25 10:24 AM EST

## 2025-01-01 NOTE — ED Notes (Addendum)
 Patient verbally consented with witness of Alm PEAK. Patient unable to sign. Daughter, Emergency Contact, also verified consent for patient to receive blood.

## 2025-01-01 NOTE — Nursing Note (Signed)
 MD ordered CBC for tomorrow am. No H/H post transfusion after 2 hours per MD.

## 2025-01-01 NOTE — Assessment & Plan Note (Addendum)
 Ineligible for anticoagulation as patient's bleeding risk is greater than benefit.  - Continue home diltiazem  90mg  per tube q8h - Continue home metoprolol  50mg  BID per tube

## 2025-01-01 NOTE — Procedures (Signed)
 WOUND DEBRIDEMENT  Wound location: Sacrum.  Debridement indication: Necrosis  Pre-procedure verification/TIME OUT:  The proposed risks versus benefits of this procedure were discussed in detail with family.  family verbalizes understanding and elects to proceed with this procedure.   Relevant documentation was reviewed prior to procedure including signed consent form, medications, and history and physical. All necessary equipment for procedure is available at time of procedure yes. An audible time out was done at 0849AM by Wcn , correctly identifying patients name, medical record number, correct side, correct site, and correct procedure to be performed with Registered Nurse members of the procedure team all in agreement.    Procedure:  Site marked before the procedure: No  Character of Wound Pre-Debridment: Improved  Type and Level of Debridement:   Excisional: Excisional Debridement: Defined as the surgical removal or cutting away of devitalized tissue, necrosis, or slough; includes definite cutting away of tissue.   Down to and including muscle  Instruments Used:  Curette  Estimated Blood Loss: none  Post Debridement Findings: Measurements (cm): 16.2cm X 12.4cm X 2.1cm  Percentage of Wound Debrided: 80   Character of Wound Post Debridement: Unchanged   Bleeding Controlled with N/A  Dressings Applied: Dakin's solution  Biologics: Not Applicable  Condition of the Wound: Unchanged.  Procedure Location: Performed: Bedside  BAILEY, CHRISTOPHER T, DO  01/01/25 9:02 AM EST

## 2025-01-01 NOTE — Progress Notes (Addendum)
 "     PATIENT INFORMATION   Patient Name: Ricardo [name]  Date of Birth: 02-05-1951  MRN: [id number]   Date of Admission: 10/01/2024  5:53 PM Length of Stay: 92 Length of Stay: 92   Primary Care Physician: No primary care provider on file.  Attending Physician:VAN EYK, JASON J, MD    SUBJECTIVE   Chief Complaint:  Status post septic shock and acute respiratory failure.  Interval Summary:  Ricardo [name] is a 74 y.o. male that has been admitted to [facility].  The patient was admitted to [facility] from 08/03/2024 until 10/01/2024.  The patient was initially admitted to [facility] for UTI, septic shock, acute respiratory failure an absolute neutropenia.  He was subsequently transferred to [facility] for anuric  renal failure in the setting of acute hypoxemic respiratory failure.  The patient underwent CRRT from 08/06/2024 and was transitioned to intermittent hemodialysis.  The patient's course at the outside facility was complicated by requiring high O2.  Patient was on BiPAP, high-flow nasal cannula.  The patient was intubated but self extubated on 09/01/2024.  The patient was intubated twice while at the outside facility.  The patient has stated he did not want tracheostomy placed so he changed his status to DNR/DNI.  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  While at the outside facility patient was diagnosed with ESBL bacteremia and Enterococcus faecalis peritonitis.  He was started on ertapenem, for a total of 2 weeks to end on 10/05/2024.  09/28/2024 patient underwent paracentesis with removal of 1.8 L. thirty fluid was positive for SPB ,cultures were positive for Enterococcus faecalis and patient was discharged on IV ampicillin and Rocephin  to end on 10/07/2021  Subjective:  The patient remains on room air.  He is alert and oriented times 2-3.  He remains on PEG tube feeds at this time.  Dialysis when  I walk into the room today.  Nursing contacted me earlier today in his hemoglobin dropped where we are transfusing him with 2 units of blood.  He is still having clots coming from his urethra.   OBJECTIVE   Objective  Vital Signs:  BP 104/52   Pulse 92   Temp 97.9 F (36.6 C)   Resp 19   Ht 6' 1 (1.854 m)   Wt 114 lb 3.2 oz (51.8 kg)   SpO2 93%   BMI 15.07 kg/m    I/O last 24 Hours:  Intake/Output Summary (Last 24 hours) at 01/01/2025 1410 Last data filed at 01/01/2025 1318 Gross per 24 hour  Intake 1272 ml  Output --  Net 1272 ml     Physical Exam:  Elderly cachectic appearing gentleman lying in bed awake  in no obvious distress HEENT:  Normocephalic atraumatic, PERRLA EOMs intact nares  patent, oropharynx clear Neck : tracheostomy  midline, no secretions  Cardiovascular : S1-S2 no murmurs rubs or gallops Lungs:  Decreased breath sounds in the bases bilateral Abdomen: positive bowel sounds soft peg intact Extremities: no clubbing cyanosis or edema  Neuro: awake alert and talking  Labs:   His labs from 01/01/2025 shows sodium of 134, potassium 4.8 with a BUN of 88 and a creatinine of 2.7.  His WBC is 7.1 with a hemoglobin of 5.5 and a platelet count of 73.  Xrays:  KUB 12/12/2024:  Mild-to-moderate stool burden with no abnormal bowel dilation.  MRI pelvis 12/10/2024:  Midline wound posterior to the sacrum and  coccyx extending deep to the level of the sacrococcygeal junction with gas extending through the defect at the level of the sacrococcygeal junction and associated marrow signal abnormality, compatible with osteomyelitis. No abscess.  8.0 x 6.0 x 3.4 cm intramuscular hematoma within the right iliacus muscle.  Diffuse body wall anasarca and intramuscular edema.  CXR 12/10/2024:  Minimal bibasilar subsegmental atelectasis.  CT abd/pelvis 12/08/2024:  There is a deep wound midline inferior to the coccyx with findings concerning for diskitis/osteomyelitis assess  sacrococcygeal junction.  There was no organized fluid collection.  CXR 10/20/2024:  Mild interstitial edema with bilateral pleural effusions and bibasilar atelectasis.     ASSESSMENT AND PLAN   Ricardo [name] is a 74 y.o. male  who was admitted on 10/01/2024 with Acute kidney injury due to sepsis [N17.9, A41.9] .    Assessment/Plan:  ESBL bacteremia.  The patient has completed a course of ertapenem for his bacteremia.  Repeat blood cultures on 10/04/2024 were negative.    Enterococcus faecalis peritonitis.  The patient completed a course of IV ampicillin and Rocephin  for his peritonitis on 10/14/2024.   We will continue to monitor his white blood cell count and fever curve.  He was noted to have some abdominal distention where he was sent to IR where he had a abdominal ultrasound done 11/18/2024 and there was no significant ascites by ultrasound.    Acute kidney injury currently on hemodialysis.  He went into acute renal failure due to septic shock with ATN.  The patient is status post right tunneled HD catheter placement on 10/28/2024.  Nephrology is currently following and we will continue on dialysis on a M/W/F schedule.   UTI/sepsis.  The patient is now having low-grade temp on 12/06/2024 and has recurrence of his hematuria.  His UA showed a probable urinary tract infection where I ordered a repeat urine culture on 12/06/2024.  He had a UTI that was treated last month that showed ESBL Klebsiella pneumoniae.  He was started on empiric IV meropenem  based on those sensitivities.  Nursing was unable to obtain a urine culture before we started him on his antibiotics.  He had continued fever until 12/10/2024 where blood cultures as well as a chest x-ray were obtained.  His chest x-ray was clear and his blood cultures are currently negative.  I also broadened out his antibiotics to IV vancomycin  for Gram positive coverage.  Unfortunately, the patient went into worsening sepsis on the night of  12/11/2024 where he was given a normal saline bolus and started on Levophed.  He is stabilized in his Levophed was soon after discontinued.  He completed a course of meropenem  for UTI on 12/26/2024.    Hematuria.  He was having hematuria with clots where I sent him to the [name] for Urology consultation on 12/19/2024.  The ER attending is no states that they called Urology and they did not come see the patient but recommended to follow up as an outpatient and there was nothing to do right now for the hematuria.  The patient quit bleeding but this has restarted over the last few days where I asked him to place a Foley in him and we did intermittent manual irrigation where we saw no blood clots but he initially had bright red blood that irrigated down and cleared.  I spoke to Dr. Selma in urology about sending the patient down to the ER for Urology consultation.  Unfortunately, he is not going to be done with dialysis until  around 4 or 5:00 a.m. today.  We will attempt another manual irrigation later this evening and I am going to obtain a CT urogram to see if we see any lesions.  I spoke to Dr. Marcelino in nephrology who does not think the patient is going to have renal recovery and he has given the ok to give the patient contrast thru his IV for this CT scan.  Depending on the CT urogram results, we will probably need to send him to the ER for Urology to evaluate him tomorrow morning on 01/02/2025.  Possible dental abscess.  He has poor dentition and a possible dental abscess with pain in his left jaw which was treated.  Acute metabolic encephalopathy.  He developed encephalopathy with the acute delirium at the outside hospital.  He is currently on sertraline  and trazodone  at this time.  His mental status had improved but his daughter states today on 12/21/2024 he has not been sleeping well and has some confusion that is mild.  I started him on Xanax PRN at nighttime for sleep.  He will continue his current  dose of trazodone  for sleep.  Myelofibrosis/anemia.  He has some anemia andthrombocytopenia but at baseline, he does not have leukocytosis.  We will transfuse him as needed.  They have started him on Micera for his anemia of chronic kidney disease.  He had a negative stool guaiac on 10/05/2024.  His iron studies show a low iron level.   His anemia is probably related to his myelofibrosis and chronic kidney disease.  He has required frequent transfusions during his stay here at [facility].  He was last transfused on 12/28/2024.  His hemoglobin this morning on 01/01/2025 was 5.5 where we will transfuse him with 2 units of blood.  We will repeat another CBC in the morning.  Hypothyroidism.  He remains on levothyroxine  at this time.  Paraphimosis.  He was seen by Urology in 08/2024.  A Foley was placed and was then removed on 09/25/2024.  He currently does not have a Foley catheter.  Atrial fibrillation/sinus tachycardia.  He seems to be rate controlled at this time.  He is on amiodarone as well as metoprolol .  His Aspirin  held due to severe anemia.  The patient has had some sinus tachycardia where we increased his metoprolol  to 25 mg t.i.d..  We will continue to monitor with telemetry.  His sinus tachycardia has been intermittent and we will continue his current dosing.  We increased his amiodarone from 100 mg to 200 mg daily but he still has some sinus tachycardia.  Cardiology was consulted he is on Cardizem  CD 360 mg daily and metoprolol  50 mg b.i.d. which was controlling his heart rate.  The patient went back into sinus tachycardia on 12/11/2024 where I have loaded him with amiodarone and IV digoxin.  His Levophed has been discontinued and now his sinus tachycardia is resolved.  I discontinued his digoxin.  We will continue with telemetry.  Wounds/osteomyelitis.  The patient has an unstageable sacral pressure ulcer as well as a deep tissue injury to his left ear and his nose.  Wound Care is following.   We will continue with the Tylenol  and oxycodone  PRN.  Wound Care felt that his wound looked infected on 12/08/2024 where we obtain a CT scan of his abdomen and pelvis on 12/08/2024 that showed a deep wound midline inferior to the coccyx with findings concerning for diskitis/osteomyelitis at the sacrococcygeal junction.  His MRI of his pelvis on 12/10/2024  shows osteomyelitis at the sacrococcygeal junction.  He is currently on IV meropenem  and IV vancomycin .  Wound care called me into the room on 12/15/2024 where over the last 4-5 days they state that his sacral wound has worsened.  I felt that he need a possible wound debridement and he also has a infection around his wound.  He was sent to see the general surgeon in [facility] ER on 12/19/2024 and they felt there was no need for emergent debridement and they recommended to wet-to-dry dressing to central area and offload as possible.  I will continue him on meropenem  and IV linezolid  for his wound infection and had a discussion with pharmacy where the patient will complete 6 weeks of antibiotics with the completion date on 01/30/2025.  The wound care physician saw him on 12/25/2024 and did a bedside debridement.  Wound Care saw him again on 01/01/2025 and I am told by the wound care nurse that he has recommended the patient to have a surgical debridement.  We will probably need to send him to the ER tomorrow morning on 01/02/2025 and have General surgery reassess him.  Severe protein calorie malnutrition/dysphagia.  He is status post PEG tube placement.  He has had a PEG tube malfunction where  Interventional Radiology replaced his PEG tube on 11/11/2024.  He has had some complication of some bleeding around his PEG tube site where Interventional re-evaluated him.  He has not had any further bleeding from around his PEG tube site.  He is currently tolerating tube feeds well.  The dietitian will continue to follow along.  Foot pain.  He is probably having some  neuropathy since he is complaining of pain in both feet. started on some low-dose gabapentin  100 mg at night.  Debility.  PT and OT therapies are currently following.  Prevention.  Heparin  for DVT prophylaxis held (due to severe anemia) and omeprazole for GI prophylaxis.  Dispo.  The patient will need skilled nursing facility placement.  He was discussed in our multidisciplinary team meeting on 12/30/2024 and he is going to need out of state placement due to him not being able to sit up for dialysis.  His daughter was updated at bedside on 01/01/2025.     Code Status:  Limited   Spent a total of 33 minutes on this encounter. This includes reviewing patient's extensive history, assessment and visit with the patient as well as documentation time. Time spent also includes IDT collaboration.    SIGNATURE   Electronically signed:  VAN EYK, JASON J, MD  01/01/2025 at 2:10 PM EST      "

## 2025-01-01 NOTE — Assessment & Plan Note (Addendum)
 Intermittent for ~4-6 months now. 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 +/- clots. Urology consulted 01/02 and recommended outpatient follow up.  - Foley placed 01/15 in ED  - Consider urology consult

## 2025-01-01 NOTE — ED Provider Notes (Signed)
 " [place] EMERGENC[facility]PARTMENT AT  HOSPITAL Provider Note   CSN: [id number] Arrival date & time: 01/01/25  8195     Patient presents with: GI Problem, Rectal Bleeding, and Hematuria   Ricardo [name] is a 74 y.o. male.   74 year old male history of prostate cancer, COPD, chronic hypoxic respiratory failure, atrial fibrillation not currently on anticoagulation, and ESRD on dialysis who presents to the emergency department with bright red blood per rectum.  Patient has been treated for hematuria recently.  Undergoing bladder irrigation by urology.  Says that today they noted that his hemoglobin is 5.5.  Was given 2 units of blood during dialysis and then had a large bloody bowel movement.       Prior to Admission medications  Medication Sig Start Date End Date Taking? Authorizing Provider  acetaminophen  (TYLENOL ) 325 MG tablet Place 650 mg into feeding tube every 6 (six) hours as needed for mild pain (pain score 1-3).   Yes [provider]  ALPRAZolam (XANAX) 0.5 MG tablet Take 0.5 mg by mouth at bedtime as needed.   Yes [provider]  cyanocobalamin  1000 MCG tablet Place 1,000 mcg into feeding tube daily. 07/26/23  Yes [provider]  diltiazem  (CARDIZEM ) 90 MG tablet Place 90 mg into feeding tube 4 (four) times daily.   Yes [provider]  gabapentin  (NEURONTIN ) 100 MG capsule Give 100mg  per tube nightly   Yes [provider]  latanoprost  (XALATAN ) 0.005 % ophthalmic solution Apply 1 drop to eye at bedtime.   Yes [provider]  levothyroxine  (SYNTHROID ) 25 MCG tablet Place 25 mcg into feeding tube daily before breakfast.   Yes [provider]  linezolid  (ZYVOX ) 600 MG tablet Take 600 mg by mouth 2 (two) times daily.   Yes [provider]  melatonin 3 MG TABS tablet Place 3 mg into feeding tube at bedtime as needed (for sleep).   Yes [provider]  Meropenem  (MERREM  IV) Inject 500 mg  into the vein daily. In sterile water  IV syringe   Yes [provider]  metoprolol  tartrate (LOPRESSOR ) 50 MG tablet Place 50 mg into feeding tube 2 (two) times daily.   Yes [provider]  midodrine  (PROAMATINE ) 10 MG tablet Place 10 mg into feeding tube 3 (three) times daily.   Yes [provider]  omeprazole 20 MG TBDD disintegrating tablet Give 40mg  per tube daily   Yes [provider]  oxyCODONE  (OXY IR/ROXICODONE ) 5 MG immediate release tablet Place 5 mg into feeding tube every 8 (eight) hours as needed for severe pain (pain score 7-10).   Yes [provider]  polyethylene glycol (MIRALAX  / GLYCOLAX ) 17 g packet Place 17 g into feeding tube daily.   Yes [provider]  senna (SENOKOT) 8.6 MG TABS tablet Place 1 tablet into feeding tube at bedtime.   Yes [provider]  sertraline  (ZOLOFT ) 50 MG tablet Place 50 mg into feeding tube daily.   Yes [provider]  Sodium Hypochlorite (DAKINS, 1/2 STRENGTH, EX) Apply 1 application  topically daily.   Yes [provider]  traZODone  (DESYREL ) 100 MG tablet Place 100 mg into feeding tube at bedtime.   Yes [provider]  UNABLE TO FIND Inject 100 mcg into the vein every 14 (fourteen) days. Micera   Yes [provider]  dextrose  50 % solution Inject 12.5 g into the vein as needed for low blood sugar. 532mL/hr Patient not taking: Reported on  01/01/2025    [provider]  dextrose  50 % solution Inject 25 g into the vein as needed for low blood sugar. 1028mL/hr Patient not taking: Reported on 01/01/2025    [provider]  glucagon Emergency 1 MG SOLR Inject 1 mg into the muscle as needed (for hyperglycemia). Patient not taking: Reported on 01/01/2025    [provider]  glucose 4 GM chewable tablet Chew 3 tablets by mouth as needed for low blood sugar. Patient not taking: Reported on 01/01/2025    [provider]   insulin lispro (HUMALOG) 100 UNIT/ML injection Inject 0-12 Units into the skin every 6 (six) hours. Patient not taking: Reported on 01/01/2025    [provider]  sodium chloride  0.9 % infusion 0-400 mLs as needed. During dialysis Patient not taking: Reported on 01/01/2025    [provider]  UNABLE TO FIND Erythropoietin as needed per policy Patient not taking: Reported on 01/01/2025    [provider]    Allergies: Patient has no known allergies.    Review of Systems  Updated Vital Signs BP (!) 137/55   Pulse 88   Temp 98.5 F (36.9 C) (Oral)   Resp (!) 36   Ht 6' (1.829 m)   Wt 59 kg   SpO2 100%   BMI 17.64 kg/m   Physical Exam Constitutional:      Comments: Chronically ill-appearing  Abdominal:     General: There is no distension.     Palpations: There is no mass.     Tenderness: There is no abdominal tenderness. There is no guarding.  Genitourinary:    Comments: Chaperoned by Delon PEAK. Large sacral wound.  Bright red blood per rectum.  Foley catheter in place draining red tinged urine Neurological:     Mental Status: He is alert.     (all labs ordered are listed, but only abnormal results are displayed) Labs Reviewed  COMPREHENSIVE METABOLIC PANEL WITH GFR - Abnormal; Notable for the following components:      Result Value   Glucose, Bld 105 (*)    BUN 32 (*)    Creatinine, Ser 1.33 (*)    Calcium  8.8 (*)    Albumin 2.5 (*)    AST 54 (*)    Alkaline Phosphatase 327 (*)    GFR, Estimated 57 (*)    All other components within normal limits  CBC WITH DIFFERENTIAL/PLATELET - Abnormal; Notable for the following components:   WBC 10.7 (*)    RBC 2.58 (*)    Hemoglobin 8.0 (*)    HCT 24.3 (*)    RDW 16.9 (*)    Platelets 74 (*)    nRBC 0.6 (*)    Neutro Abs 8.7 (*)    Basophils Absolute 0.4 (*)    Abs Immature Granulocytes 0.60 (*)    All other components within normal limits  I-STAT CHEM 8, ED - Abnormal; Notable for the  following components:   BUN 35 (*)    Creatinine, Ser 1.40 (*)    Glucose, Bld 109 (*)    Calcium , Ion 1.03 (*)    Hemoglobin 7.1 (*)    HCT 21.0 (*)    All other components within normal limits  POC OCCULT BLOOD, ED - Abnormal; Notable for the following components:   Fecal Occult Bld POSITIVE (*)    All other components within normal limits  CBC  RENAL FUNCTION PANEL  HEMOGLOBIN AND HEMATOCRIT, BLOOD  TYPE AND SCREEN  PREPARE RBC (CROSSMATCH)  EKG: None  Radiology: No results found.   Procedures   Medications Ordered in the ED  linezolid  (ZYVOX ) IVPB 600 mg (0 mg Intravenous Stopped 01/02/25 0024)  meropenem  (MERREM ) 500 mg in sodium chloride  0.9 % 100 mL IVPB (has no administration in time range)  diltiazem  (CARDIZEM ) tablet 90 mg (has no administration in time range)  pantoprazole  (PROTONIX ) injection 40 mg (0 mg Intravenous Hold 01/02/25 0000)  latanoprost  (XALATAN ) 0.005 % ophthalmic solution 1 drop (has no administration in time range)  sertraline  (ZOLOFT ) tablet 50 mg (has no administration in time range)  metoprolol  tartrate (LOPRESSOR ) tablet 50 mg (has no administration in time range)  midodrine  (PROAMATINE ) tablet 10 mg (has no administration in time range)  traZODone  (DESYREL ) tablet 100 mg (100 mg Per Tube Given 01/02/25 0011)  acetaminophen  (TYLENOL ) tablet 1,000 mg (has no administration in time range)    Or  acetaminophen  (TYLENOL ) suppository 650 mg (has no administration in time range)  oxyCODONE  (Oxy IR/ROXICODONE ) immediate release tablet 5 mg (has no administration in time range)  gabapentin  (NEURONTIN ) 250 MG/5ML solution 100 mg (100 mg Per Tube Given 01/02/25 0011)  levothyroxine  (SYNTHROID ) tablet 25 mcg (has no administration in time range)  free water  30 mL (30 mLs Per Tube Given 01/02/25 0012)  pantoprazole  (PROTONIX ) injection 80 mg (80 mg Intravenous Given 01/01/25 1936)  0.9 %  sodium chloride  infusion (Manually program via Guardrails IV Fluids)  (0 mLs Intravenous Stopped 01/01/25 2013)    Clinical Course as of 01/02/25 0043  Thu Jan 01, 2025  1908 Dr Suzann from [facility] consulted.  Recommends clear liquid diet and they will evaluate the patient in the morning to see if he needs repeat colonoscopy [RP]  1920 Dr Lupie from [facility] consulted for admission.  [RP]    Clinical Course User Index [RP] Yolande Lamar BROCKS, MD                                 Medical Decision Making Amount and/or Complexity of Data Reviewed Labs: ordered.  Risk Prescription drug management. Decision regarding hospitalization.   Ricardo [name] is a 74 year old male history of prostate cancer, COPD, chronic hypoxic respiratory failure, atrial fibrillation not currently on anticoagulation, and ESRD on dialysis who presents to the emergency department with bright red blood per rectum.    Initial Ddx:  Upper GI bleed, peptic ulcer, lower GI bleed, diverticulosis, malignancy, AVM, hemorrhoids, variceal bleed, symptomatic anemia  MDM:  Concerned that the patient may have a lower GI bleed based on their symptoms.  Hemoglobin of 5.5 prior to arrival.  Has received 2 units of blood.  Not on blood thinners.  Hemodynamically stable on arrival.  Does have dark red blood and bright red blood and was Hemoccult positive.  Will give an additional unit since he appears to be actively bleeding.  Will go ahead and start the patient on Protonix  and consult GI.  Unfortunately since he started dialysis in the past few months not candidate for CTA since they require prior dialysis for 6 months for contrast  Plan:  Labs Type and screen Hemoccult Protonix  GI consult  ED Summary/Re-evaluation:  GI consulted and they will see the patient.  They recommend starting him on a clear liquid diet.  They will evaluate him for colonoscopy but recommend following his hemoglobin in the meantime.  Discussed with family medicine for admission.  This patient presents to the ED for concern  of complaints listed in HPI, this involves an extensive number of treatment options, and is a complaint that carries with it a high risk of complications and morbidity. Disposition including potential need for admission considered.   Dispo: Admit to Floor  [facility] Records reviewed DC Summary The following labs were independently interpreted: CBC and show acute anemia I have reviewed the patients home medications and made adjustments as needed Consults: Gastroenterology and family medicine Social Determinants of health:  Geriatric  CRITICAL CARE Performed by: Lamar JAYSON Shan   Total critical care time: 30 minutes  Critical care time was exclusive of separately billable procedures and treating other patients.  Critical care was necessary to treat or prevent imminent or life-threatening deterioration.  Critical care was time spent personally by me on the following activities: development of treatment plan with patient and/or surrogate as well as nursing, discussions with consultants, evaluation of patient's response to treatment, examination of patient, obtaining history from patient or surrogate, ordering and performing treatments and interventions, ordering and review of laboratory studies, ordering and review of radiographic studies, pulse oximetry and re-evaluation of patient's condition.    Final diagnoses:  Lower GI bleed  Anemia, unspecified type    ED Discharge Orders     None          Shan Lamar JAYSON, MD 01/02/25 [phone]  "

## 2025-01-01 NOTE — Consults (Signed)
 Pharmacy Consult - Diarrhea Management  Patient: [name] MRN#: [id number] Attending: Selinda JINNY Fleeta Valeria, Md [[id number]] Admission Date: [phone]     Diet Orders  (From admission, onward)           Start     Ordered   12/25/24 1800  Fiber supplement Banatrol TF; 1 unit packet; Feeding tube  RD 3 times daily       End/Expires: Until Specified    Question Answer Comment  [facility] Supplement: Banatrol TF   Volume: 1 unit packet   Administration Route: Feeding tube      12/25/24 1632   12/25/24 1700  Nutritional supplement Juven; 1 unit pack; Feeding tube  2 times daily at breakfast and dinner       End/Expires: Until Specified    Question Answer Comment  [facility] Supplement: Juven   Volume: 1 unit pack   Administration Route: Feeding tube      12/25/24 1630   12/23/24 0839  NPO Diet, Tube Feeding No Tray Strict NPO; Nepro; Tube Feeding Continuous rate (mL/hr): 65; Tube Feeding water  flush (mL): 30; Water  Flush type: Water ; Water  flush frequency: Every 2 hours  Diet effective now       Comments: HOLD TF 1 hour before and after Levothyroxine   End/Expires: Until Specified    Question Answer Comment  NPO Status: Strict NPO   Tube Feeding Formula: Nepro   Tube Feeding Continuous rate (mL/hr): 65   Tube Feeding water  flush (mL): 30   Water  Flush type: Water    Water  flush frequency: Every 2 hours   Place order in third party system. Done      12/23/24 0839            Intake & Output last 3 days                            12/30/24 0700 - 12/31/24 0659 12/31/24 0700 - 01/01/25 0659 01/01/25 0700 - 01/02/25 0659    9299-8140 1900-0659 Total 0700-1859 1900-0659 Total 0700-1859 1900-0659 Total                 Intake   P.O.  240  -- 240  300  -- 300  15  -- 15   I.V.  --  -- --  --  10 10  --  -- --   NG/GT  900  960 1860  --  802 802  --  -- --   IV Piggyback  --  -- --  --  10 10  --  -- --   Total Intake 1140 960 2100 [phone] 15 -- 15     Output   Dialysis  2000   -- 2000  --  -- --  --  -- --   Total Output 2000 -- 2000 -- -- -- -- -- --     Net I/O    -860 960 100 [phone] 15 -- 15       Review of Weight:   Admit Weight: 111 lb 15.9 oz (50.8 kg) (10/01/24 0200)   Latest Weight: 114 lb 3.2 oz (51.8 kg) (12/31/24 0444)   Recent Stool Output: Ostomy Stool Output 12/30/24 0700 - 12/30/24 1859 12/30/24 1900 - 12/31/24 0659 12/31/24 0700 - 12/31/24 1859 12/31/24 1900 - 01/01/25 0659 01/01/25 0700 - 01/01/25 9178  Patient has no LDAs of requested type attached.   Stool Output  12/31/2024 0602 12/31/2024 1000 12/31/2024 1400 12/31/2024 2000 01/01/2025 0000   Stool Appearance: Bristol 7 - Liquid consistency with no solid pieces Bristol 7 - Liquid consistency with no solid pieces Bristol 7 - Liquid consistency with no solid pieces Bristol 7 - Liquid consistency with no solid pieces [place] 6 - Mushy consistency with ragged edges   Stool Amount Estimated: Small Large Large Large Medium       Relevant Labs:   Liquid Medication review: No liquids on medication profile  Laxative Medication review: Consider discontinuing laxatives/stool softeners (need MD order)  Additional Recommendations: Pt has scheduled Miralax  and Senna.  Would discontinue.    GAMBILL, CHARLES W., PharmD 01/01/25 8:21 AM EST

## 2025-01-01 NOTE — H&P (Addendum)
 "    Hospital Admission History and Physical Service Pager: [phone]  Patient name: Ricardo [name] Medical record number: [id number] Date of Birth: 04/22/1951 Age: 74 y.o. Gender: male  Primary Care Provider: [facility] [place] Consultants: GI Code Status: Confirmed with [name] Ricardo [name], patient would not want a tracheostomy or chest compressions, she has recently been told he cannot be intubated again. Patient has previously been documented as DNR/DNI. Preferred Emergency Contact:  Contact Information     Name Relation Home Work Ricardo [name]   [phone]      Other Contacts   None on File    Chief Complaint: anemia, bright red blood per rectum  Assessment and Plan: SEMISI [name] is a 74 y.o. male presenting with anemia and bright red blood per rectum. Differential for presentation of this includes but is not limited to:  Upper/Lower GI bleed: most likely etiology with patient history notable for giant pyloric/duodenal ulcer and primary myelofibrosis predisposing the patient to platelet dysfunction, recent hospitalizations that involved GI bleed, bright red blood per rectum, and anemia with a hgb of 5.5. Myelofibrosis-associated acquired von Willebrand syndrome causing multifocal bleeding: possibility with patient's increasing incidence of GI bleed and ineligibility for JAK2 inhibitor treatment, increasing age and worsening myeloproliferative disease  Variceal hemorrhage from myelofibrosis-induced portal hypertension: less likely without evidence of esophageal varices on prior EGD performed 09/16/2024 which showed grossly normal esophagus, GE junction, stomach, and duodenum Assessment & Plan GI bleed Primary myelofibrosis (HCC) See HPI for detailed recent history of GI bleed and hospitalizations.  - Admit to [facility] under Dr. McDiarmid - s/p 2u PRBC at dialysis and 1u PRBC in ED  - Transfuse as needed, transfusion threshold <7  - Repeat h&h 4 hours  post transfusion - s/p 80mg  IV Protonix  in ED   - Continue home omeprazole 40mg  BID per tube with formulary IV Protonix  40mg  BID per tube - Pain control: Tylenol  1000mg  per tube q6h PRN for mild pain, oxycodone  5mg  per tube q6h PRN for moderate pain - GI consulted by EDP, appreciate recommendations  - clear liquid diet   - will evaluate 01/16 - PT/OT evaluation  - Fall precautions initiated - Delirium precautions initiated - AM RFP, CBC Hematuria Intermittent for ~4-6 months now. 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 +/- clots. Urology consulted 01/02 and recommended outpatient follow up.  - Foley placed 01/15 in ED  - Consider urology consult  ESRD (end stage renal disease) on dialysis (HCC) T/Th/Sat HD schedule.  - Consult nephrology Chronic osteomyelitis (HCC) Chronic sacral pressure ulcer/osteomyelitis. IV abx started at [facility] for a 6 week course of dual therapy ending 01/30/2025. - Continue IV meropenum 500mg  and IV linezolid  600mg  with end date 02/13 - Offloading with PT/OT - Consider alternating pressure mattress - Wound care consulted, appreciate care  Atrial fibrillation (HCC) Ineligible for anticoagulation as patient's bleeding risk is greater than benefit.  - Continue home diltiazem  90mg  per tube q8h - Continue home metoprolol  50mg  BID per tube Severe protein-calorie malnutrition PEG (percutaneous endoscopic gastrostomy) status (HCC) Most recent RD diet orders 01/08 from [facility]:  -1 packet fiber supplement TID - 1 packet Juven BID - Strict NPO diet - Nepro tube feeding continuous rate 48mL/hr - Tube feeding water  flush 30mL every 2 hours; order placed  - HOLD tube feeds 1 hour before and after levothyroxine  - Consulted RD, appreciate recommendations Chronic health problem H/o metabolic encephalopathy and insomnia: continue home trazodone  100mg  per tube at bedtime and home sertraline   50mg  per tube daily  Hypotension: continue home midodrine  10mg  per tube  TID Hypothyroidism: continue home levothyroxine  25mcg per tube daily Neuropathy: continue gabapentin  100mg  per tube QHS  FEN/GI: clear liquids VTE Prophylaxis: SCDs  Disposition: progressive  History of Present Illness:  Ricardo [name] is a 74 y.o. 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  History primarily via chart review and [name] Ricardo [name].  Recently admitted with septic shock, on dialysis. Sent to [facility] hospital, has recently had hematuria. He received 2U PRBC in dialysis after he was found to have a hgb 5.5 this morning. Then had large bloody BM after dialysis. He is not on anticoagulation for his Afib since bleeding risk > benefit.  Of note per chart review, patient was admitted to [facility] 08/16-08/17/2025 for UTI, septic shock, acute respiratory failure, renal failure and absolute neutropenia and subsequently transferred to [facility] ICU for anuric renal failure due to septic shock with ATN in the setting of acute hypoxemic respiratory failure. Intubated 08/07/2024. GI bleed during hospitalization. Bronched 08/20/2024 which showed multidrug-resistant Klebsiella pneumonia. Extubated 09/05, reintubated 09/10, self extubated 09/16. PEG placement 10/01.  Discharged to [facility] care on 10/01/2024 where he was diagnosed with ESBL bacteremia with completion of 2 weeks of ertapenem and repeat blood cultures 10/04/2024 negative. Paracentesis 09/28/2024 positive for Enterococcus faecalis peritonitis treated with a course of IV ampicillin and Rocephin  which ended on 10/14/2024.  Patient was having hematuria with clots when he was sent to [name] ED on 12/19/2024 for urology and general surgery consultation for chronic sacral pressure ulcer/osteomyelitis.  At  that time both specialties recommended outpatient follow-up.  He has continued to get PRBC transfusions as needed throughout his time at [facility] for his myelofibrosis/anemia/chronic thrombocytopenia.  For his chronic sacral wounds, a bedside debridement was done on 12/25/2024.  Patient was placed on IV meropenem  and IV linezolid  for wound infections for 6 weeks with completion date of 01/30/2025.  Per chart review as well as discussion with [name] Ricardo [name], patient has stated he does not want a tracheostomy placed and would not like chest compressions either.  She was told recently that patient cannot be intubated again.  Patient has previously been documented as DNR/DNI.  [name] states the patient's mental status has declined recently.  He seems to be alert and oriented x 2 when able to verbalize.  Currently nonverbal, following with speech at [facility].  [name] said the patient has always talked with a slur.  Sometimes needs 0.5 to 1 L O2 while sleeping.   He received 2 units PRBC at dialysis and 1 unit PRBC in the ED. per GI Dr. Suzann, CLO and will see in AM. Had colonosocpy in 2024 and recent EGD 09/25.  80 mg of IV Protonix  was given.  Labs on arrival were significant for creatinine of 2.79, hemoglobin of 5.5, platelets 73 normal platelet and RBC morphology, positive fecal occult blood.  Review Of Systems: Per HPI   Pertinent Past Medical History: Prostate cancer Chronic pain COPD with emphysema Acute hypoxemic respiratory failure Atrial fibrillation Neutropenic fever Septic shock Severe malnutrition Primary myelofibrosis Anemia Chronic thrombocytopenia Acute renal failure with ATN superimposed on chronic kidney disease GERD Anxiety Gout Remainder reviewed in history tab.   Pertinent Past Surgical History:  Remainder reviewed in history tab  Pertinent Social History:  Tobacco use: No Alcohol use: None Other Substance use: Unknown Lives at rehab  facility  Pertinent Family History: Remainder reviewed in history tab.   Important Outpatient Medications: Xanax 0.5 mg bedtime as needed Vitamin B12 1000 mcg daily Diltiazem  90 mg 4 times daily Gabapentin  100 mg nightly Emergency glucagon 1 mg as needed Humalog 0 to 12 units every 6 hours Latanoprost  drops daily at bedtime Levothyroxine  25 mcg daily Melatonin 3 mg at bedtime as needed Metoprolol  50 mg twice daily Midodrine  10 mg 3 times daily Omeprazole 40 mg daily MiraLAX  daily Senna daily Zoloft  50 mg daily Trazodone  100 mg daily at bedtime Remainder reviewed in medication history.   Objective: BP (!) 111/46 (BP Location: Right Arm)   Pulse 95   Temp 98.2 F (36.8 C) (Oral)   Resp 19   Ht 6' (1.829 m)   Wt 59 kg   SpO2 98%   BMI 17.64 kg/m  Exam: General: Frail and cachectic male lying supine in hospital bed, in no acute distress Eyes: Nonicteric, EOMI ENTM: Dry mucous membranes, partially edentulous without top teeth Cardiovascular: Distant heart sounds, 2+ radial pulses Respiratory: CTAB, normal work of breathing on room air Gastrointestinal: Soft, nondistended, mild TTP to LLQ, bowel sounds present. G tube in place MSK: Obvious muscle wasting Derm: No rashes Neuro: Unable to evaluate as patient tries to speak but is unintelligible.  When asked where he is having pain he is able to point to his legs.  Labs:  CBC BMET  Recent Labs  Lab 01/01/25 0405 01/01/25 1857  WBC 7.1  --   HGB 5.5* 7.1*  HCT 16.6* 21.0*  PLT 73*  --    Recent Labs  Lab 01/01/25 0405 01/01/25 1857  NA 134* 137  K 4.8 3.6  CL 95* 100  CO2 26  --   BUN 88* 35*  CREATININE 2.79* 1.40*  GLUCOSE 109* 109*  CALCIUM  9.4  --      EKG: SR at 89bpm, Qtc 521  Imaging Studies Performed: None  Camie Dixons, DO PGY-1, [facility]    FPTS Intern pager: [phone], text pages welcome Secure chat group [facility] Teaching Service    Upper Level  Addendum: I have seen and evaluated this patient along with Dr. dixons and reviewed the above note, making necessary revisions as appropriate. I agree with the medical decision making and physical exam as noted above. Elyce Prescott, DO PGY-2 [facility] Residency  "

## 2025-01-01 NOTE — Nursing Note (Signed)
 Pt.Had large amount of tarry stool with blood clots Vss.Md notified and see Orders.

## 2025-01-02 ENCOUNTER — Inpatient Hospital Stay (HOSPITAL_COMMUNITY)

## 2025-01-02 ENCOUNTER — Encounter (HOSPITAL_COMMUNITY): Payer: Self-pay | Admitting: Family Medicine

## 2025-01-02 DIAGNOSIS — I4891 Unspecified atrial fibrillation: Secondary | ICD-10-CM

## 2025-01-02 DIAGNOSIS — Z8546 Personal history of malignant neoplasm of prostate: Secondary | ICD-10-CM | POA: Diagnosis not present

## 2025-01-02 DIAGNOSIS — M8660 Other chronic osteomyelitis, unspecified site: Secondary | ICD-10-CM | POA: Diagnosis not present

## 2025-01-02 DIAGNOSIS — R636 Underweight: Secondary | ICD-10-CM

## 2025-01-02 DIAGNOSIS — Z923 Personal history of irradiation: Secondary | ICD-10-CM | POA: Diagnosis not present

## 2025-01-02 DIAGNOSIS — Z515 Encounter for palliative care: Secondary | ICD-10-CM

## 2025-01-02 DIAGNOSIS — J9610 Chronic respiratory failure, unspecified whether with hypoxia or hypercapnia: Secondary | ICD-10-CM

## 2025-01-02 DIAGNOSIS — Z992 Dependence on renal dialysis: Secondary | ICD-10-CM

## 2025-01-02 DIAGNOSIS — L89150 Pressure ulcer of sacral region, unstageable: Secondary | ICD-10-CM | POA: Diagnosis not present

## 2025-01-02 DIAGNOSIS — D62 Acute posthemorrhagic anemia: Secondary | ICD-10-CM | POA: Diagnosis not present

## 2025-01-02 DIAGNOSIS — R532 Functional quadriplegia: Secondary | ICD-10-CM

## 2025-01-02 DIAGNOSIS — K922 Gastrointestinal hemorrhage, unspecified: Secondary | ICD-10-CM

## 2025-01-02 DIAGNOSIS — R310 Gross hematuria: Secondary | ICD-10-CM | POA: Diagnosis not present

## 2025-01-02 DIAGNOSIS — Z931 Gastrostomy status: Secondary | ICD-10-CM | POA: Diagnosis not present

## 2025-01-02 DIAGNOSIS — Z7189 Other specified counseling: Secondary | ICD-10-CM | POA: Diagnosis not present

## 2025-01-02 DIAGNOSIS — N186 End stage renal disease: Secondary | ICD-10-CM | POA: Diagnosis not present

## 2025-01-02 DIAGNOSIS — D471 Chronic myeloproliferative disease: Secondary | ICD-10-CM | POA: Diagnosis not present

## 2025-01-02 DIAGNOSIS — Z681 Body mass index (BMI) 19 or less, adult: Secondary | ICD-10-CM

## 2025-01-02 DIAGNOSIS — J449 Chronic obstructive pulmonary disease, unspecified: Secondary | ICD-10-CM

## 2025-01-02 DIAGNOSIS — T8389XA Other specified complication of genitourinary prosthetic devices, implants and grafts, initial encounter: Secondary | ICD-10-CM | POA: Diagnosis present

## 2025-01-02 LAB — BPAM RBC
Blood Product Expiration Date: 202601202359
Blood Product Expiration Date: 202602012359
ISSUE DATE / TIME: 202601151219
ISSUE DATE / TIME: 202601151316
Unit Type and Rh: 9500
Unit Type and Rh: 9500

## 2025-01-02 LAB — URINALYSIS, W/ REFLEX TO CULTURE (INFECTION SUSPECTED)
RBC / HPF: >50 RBC/hpf (ref 0–5)
Specific Gravity, Urine: 1.02 (ref 1.005–1.030)
pH: 8 (ref 5.0–8.0)

## 2025-01-02 LAB — RENAL FUNCTION PANEL
Albumin: 2.7 g/dL — ABNORMAL LOW (ref 3.5–5.0)
Anion gap: 11 (ref 5–15)
BUN: 40 mg/dL — ABNORMAL HIGH (ref 8–23)
CO2: 28 mmol/L (ref 22–32)
Calcium: 9 mg/dL (ref 8.9–10.3)
Chloride: 95 mmol/L — ABNORMAL LOW (ref 98–111)
Creatinine, Ser: 1.69 mg/dL — ABNORMAL HIGH (ref 0.61–1.24)
GFR, Estimated: 43 mL/min — ABNORMAL LOW
Glucose, Bld: 104 mg/dL — ABNORMAL HIGH (ref 70–99)
Phosphorus: 2 mg/dL — ABNORMAL LOW (ref 2.5–4.6)
Potassium: 4 mmol/L (ref 3.5–5.1)
Sodium: 134 mmol/L — ABNORMAL LOW (ref 135–145)

## 2025-01-02 LAB — TYPE AND SCREEN
ABO/RH(D): O NEG
Antibody Screen: NEGATIVE
Unit division: 0
Unit division: 0

## 2025-01-02 LAB — HEMOGLOBIN AND HEMATOCRIT, BLOOD
HCT: 25.5 % — ABNORMAL LOW (ref 39.0–52.0)
HCT: 25.6 % — ABNORMAL LOW (ref 39.0–52.0)
Hemoglobin: 8.6 g/dL — ABNORMAL LOW (ref 13.0–17.0)
Hemoglobin: 8.8 g/dL — ABNORMAL LOW (ref 13.0–17.0)

## 2025-01-02 LAB — CBC
HCT: 25.2 % — ABNORMAL LOW (ref 39.0–52.0)
Hemoglobin: 8.8 g/dL — ABNORMAL LOW (ref 13.0–17.0)
MCH: 31.1 pg (ref 26.0–34.0)
MCHC: 34.9 g/dL (ref 30.0–36.0)
MCV: 89 fL (ref 80.0–100.0)
Platelets: 67 K/uL — ABNORMAL LOW (ref 150–400)
RBC: 2.83 MIL/uL — ABNORMAL LOW (ref 4.22–5.81)
RDW: 15.9 % — ABNORMAL HIGH (ref 11.5–15.5)
WBC: 8.6 K/uL (ref 4.0–10.5)
nRBC: 0.4 % — ABNORMAL HIGH (ref 0.0–0.2)

## 2025-01-02 LAB — IRON AND TIBC
Iron: 83 ug/dL (ref 45–182)
Saturation Ratios: 40 % — ABNORMAL HIGH (ref 17.9–39.5)
TIBC: 209 ug/dL — ABNORMAL LOW (ref 250–450)
UIBC: 125 ug/dL

## 2025-01-02 LAB — FERRITIN: Ferritin: >7500 ng/mL — ABNORMAL HIGH (ref 24–336)

## 2025-01-02 LAB — VITAMIN B12: Vitamin B-12: 1605 pg/mL — ABNORMAL HIGH (ref 180–914)

## 2025-01-02 LAB — HEPATITIS B SURFACE ANTIGEN: Hepatitis B Surface Ag: NONREACTIVE

## 2025-01-02 LAB — AMMONIA: Ammonia: 42 umol/L — ABNORMAL HIGH (ref 9–35)

## 2025-01-02 MED ORDER — SODIUM CHLORIDE 0.9 % IV SOLN: 1.0000 g | Freq: Two times a day (BID) | INTRAVENOUS | Status: DC

## 2025-01-02 MED ORDER — CHLORHEXIDINE GLUCONATE CLOTH 2 % EX PADS
6.0000 | MEDICATED_PAD | Freq: Every day | CUTANEOUS | Status: DC
  Administered 2025-01-03 – 2025-01-05 (×3): 6 via TOPICAL

## 2025-01-02 MED ORDER — SODIUM CHLORIDE 0.9 % IV SOLN: 500.0000 mg | INTRAVENOUS | Status: DC

## 2025-01-02 MED ORDER — SODIUM CHLORIDE (PF) 0.9 % IJ SOLN
INTRAMUSCULAR | Status: AC
  Filled 2025-01-02: qty 10

## 2025-01-02 MED ORDER — OXYCODONE HCL 5 MG PO TABS
5.0000 mg | ORAL_TABLET | Freq: Four times a day (QID) | ORAL | Status: DC | PRN
  Administered 2025-01-02 – 2025-01-05 (×5): 5 mg
  Filled 2025-01-02 (×5): qty 1

## 2025-01-02 MED ORDER — IOHEXOL 9 MG/ML PO SOLN
500.0000 mL | ORAL | Status: AC
  Administered 2025-01-02: 500 mL via ORAL

## 2025-01-02 NOTE — Assessment & Plan Note (Addendum)
 Extensive stage4+ pressure ulcer w/ osteomyelitis at sacral on admission.  He has been on IV Meropenam and Lizolid which ended date initially plan for 01/30/25 for the total of 6 week course - STOP IV meropenum 500mg  and IV linezolid  600mg  d/t thrombocytopenia  plt 69 this AM - Q 2 Turning while in bed and Q15 min while in chair - Wound care consult and follow, appreciate recommendation - PT/OT consult

## 2025-01-02 NOTE — Assessment & Plan Note (Addendum)
 Via tube Medications on hold pending CTAP H/o metabolic encephalopathy and insomnia: continue home trazodone  100mg  per tube at bedtime and home sertraline  50mg  per tube daily  Hypotension: continue home midodrine  10mg  per tube TID Hypothyroidism: continue home levothyroxine  25mcg per tube daily Neuropathy: continue gabapentin  100mg  per tube QHS

## 2025-01-02 NOTE — Consult Note (Signed)
 "                              Consultation Note Date: 01/02/2025   Patient Name: Ricardo [name]  DOB: 25-Nov-1951  MRN: [id number]  Age / Sex: 74 y.o., male  PCP: [facility] [place] Referring Physician: Donzetta Rollene BRAVO, MD  Reason for Consultation: Establishing goals of care  HPI/Patient Profile: 74 y.o. 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   [facility] cared for since prolonged admission [facility] and discharge on 10/01/2024.  He was seen several times by inpatient palliative team there.  Patient's daughter reports concern with ongoing hematuria and insufficient wound care despite her continued attempts to advocate for patient's needs.  In the ED, he received PRBC, also received 2 units in dialysis.  GI consulted and suspect bleeding is from ischemic colitis/hypotension.  PMT has been consulted to assist with goals of care conversation.  Clinical Assessment and Goals of Care:  I have reviewed medical records including EPIC notes, labs and imaging, assessed the patient and then had a phone conversation with patient's daughter [name] to discuss diagnosis prognosis, GOC, EOL wishes, disposition and options.  I introduced Palliative Medicine as specialized medical care for people living with serious illness. It focuses on providing relief from the symptoms and stress of a serious illness. The goal is to improve quality of life for both the patient and the family.  We discussed a brief life review of the patient and then focused on their current illness.   I attempted to elicit values and goals of care important to the patient.     Medical History Review and Understanding:  We discussed patient's acute illness in the context of their chronic comorbidities.  Patient's daughter understands the severity of patient's illness.  Social History: Patient is divorced.  He has 1 daughter who is his entire support system.  He was living independently and walking in July 2025.  He enjoys playing the lottery, doing puzzles, previously enjoyed fishing.  Worked as a lawyer and ended set designer.  He is an Investment banker, operational.  He is a Curator.  Functional and Nutritional State: Patient has had rapid decline in functioning since prolonged hospitalization as stated above. Daughter reports he was still working with PT/OT/SLP.  Palliative Symptoms: None at this time  Advance Directives: A detailed discussion regarding advanced directives was had.  Patient's daughter states that he has not completed advanced directives.  They have had several discussions around his wishes.  Code Status: Concepts specific to code status, artifical feeding and hydration, and rehospitalization were considered and discussed.  DNR/DNI confirmed.  Discussion: Patient was able to tell me that he was not in pain, but then fell asleep.  During my phone call with patient's daughter [name], she recounted patient's complications and hurdles since all of this began July of last year at [facility]. Emotional support and therapeutic listening provided.  Her hope is that patient receives better wound care and the surgery/wound consult they were hoping for during the last ED visit.  We discussed his bedside debridement on 1/8 and how the communication gaps with [facility] have understandably  created some mistrust and doubt regarding whether this was done as best as it could have been.  This is a similar situation for his hematuria.  She is hopeful for urology follow-up rather than the assumption that it is related to his myelofibrosis, as he has not  historically had hematuria with even lower platelet counts that he has now.  We reviewed previous palliative care encounters at [facility] and she confirmed the goals of care remain consistent, hoping to improve his mobility enough to at least enjoy time in a wheelchair.  She tells me things were going good for him and quality of life was better until about a month ago.  His quality of life has not decreased to the point that he would consider hospice services.  He is a strong person who will let you know if you are doing anything that he is not agreeable to or if anything is causing him excessive pain and distress.  During her conversations with him, he has consistently voiced his interest in life-prolonging care with the exception of NO tracheostomy.  Explored thoughts and feelings on whether he would want more aggressive care than he is currently receiving, including ICU and pressor support.  This will be acceptable.  She is appreciative of the support and ultimately wants to feel like her concerns are heard.   The difference between aggressive medical intervention and comfort care was considered in light of the patient's goals of care. Hospice and Palliative Care services outpatient were explained and offered.   Discussed the importance of continued conversation with family and the medical providers regarding overall plan of care and treatment options, ensuring decisions are within the context of the patients values and GOCs.   Questions and concerns were addressed.  Hard Choices booklet left for review. The family was encouraged to call with questions or concerns.  PMT will continue to support holistically.   SUMMARY OF RECOMMENDATIONS   - DNR/DNI confirmed - Continue current care plan.  Patient's daughter is agreeable to escalation of care including ICU, pressors if patient were to decline - No trach - Patient's daughter is appreciative of open communication with the primary medical team.   Communication difficulties with team at [facility] - Goal is for improvement of health and quality of life, utilize wheelchair again - PMT will continue to follow and support.  Patient's daughter is aware we will check back in on 1/19.  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  Constitutional:      General: He is not in acute distress.    Appearance: He is ill-appearing.  Cardiovascular:     Rate and Rhythm: Normal rate.  Pulmonary:     Effort: Pulmonary effort is normal. No respiratory distress.  Skin:    General: Skin is warm.  Neurological:     Mental Status: He is lethargic.  Psychiatric:        Mood and Affect: Affect is flat.    Vital Signs: BP 130/67   Pulse (!) 101   Temp 98.6 F (37 C) (Tympanic)  Resp 20   Ht 6' (1.829 m)   Wt 59 kg   SpO2 99%   BMI 17.64 kg/m  Pain Scale: 0-10      SpO2: SpO2: 99 % O2 Device:SpO2: 99 % O2 Flow Rate: .    Palliative Assessment/Data: 30% (on tube feeds)    Brixton Schnapp SHAUNNA Fell, PA-C  Palliative Medicine Team Team phone # [phone]  Thank you for allowing the Palliative Medicine Team to assist in the care of this patient. Please utilize secure chat with additional questions, if there is no response within 30 minutes please call the above phone number.  Palliative Medicine Team providers are available by phone from 7am to 7pm daily and can be reached through the team cell phone.  Should this patient require assistance outside of these hours, please call the patient's attending physician.    I  personally spent a total of 75 minutes in the care of the patient today including preparing to see the patient, getting/reviewing separately obtained history, performing a medically appropriate exam/evaluation, counseling and educating, referring and communicating with other health care professionals, and documenting clinical information in the EHR.   "

## 2025-01-02 NOTE — Assessment & Plan Note (Addendum)
 Received 2u pRBC, 1u pRBC in ED. Stable H/H after blood transfusion.  - GI consult  - Suspected bleeding from ischemic colitis   - Pending CTAP w/ Oral contrast  - No EGD plan as pt is a poor candidate for sedation  - Trend H/H   - Ok to resume TF if CTAP negative - Continue IV Protonix  40 mg Daily  - Pain control: Tylenol , Oxycodone  - Palliative consult and follow : GOC conversation this AM with his daughter - PT/OT evaluation  - Fall precautions initiated - Delirium precautions initiated - AM RFP, CBC, PT/INR, Fibrinogen 

## 2025-01-02 NOTE — Assessment & Plan Note (Addendum)
 Dietitian consult, appreciate recommendation - Previous recommendation as follow -1 packet fiber supplement TID - 1 packet Juven BID - Strict NPO diet - Nepro tube feeding continuous rate 69mL/hr - Tube feeding water  flush 30mL every 2 hours; order placed  - HOLD tube feeds 1 hour before and after levothyroxine 

## 2025-01-02 NOTE — Consult Note (Signed)
 "    Ricardo [name] [birth year]  [id number].    Requesting MD: Dr. Rollene Keeling Chief Complaint/Reason for Consult: Sacral Wound  HPI: Ricardo [name] is a 74 y.o. 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   Per notes patient has had multiple hospitalizations last year. He was admitted to [facility] in August with UTI septic shock and acute respiratory failure and renal failure.  He PEG placement on 09/16/2024.  He was discharged to [facility] on 10/01/2024, found to have a ESBL bacteremia at that time.  He also developed an Enterococcus peritonitis, per notes, treated with IV antibiotics October 2025.    GI seeing patient for his GI bleed.  They are currently not planning any endoscopic intervention and are hopeful he can be managed supportively as they feel he is a poor candidate for sedation and high risk for complications.  He has been seen by palliative and is currently DNR/DNI.  Family is agreeable to escalation of care including ICU, pressors if patient were declined but do not want a trach.   General surgery was asked to see the patient for sacral wound.  In chart review it appears we saw this wound on 1/2.  At that time we felt there is no emergent need for debridement and recommended WTD dressing changes and offload as possible. CT scan today w/ 5.1 x 2.3 cm complex fluid collection along the right iliacus muscle, which could represent hematoma or abscess, decreased in size compared to prior along w/ Large sacral decubitus ulcer with chronic bony destructive change at the S5 segment and sacrococcygeal junction, with a 1.2 cm gap between the lower sacrum and remaining  coccyx.  ROS: ROS As above, see hpi  Family History  Problem Relation Age of Onset   Heart disease Mother    Hypertension Sister    Cancer Maternal Aunt        breast   Cancer Maternal Uncle        colon   Cancer Maternal Uncle        lung    Past Medical History:  Diagnosis Date   Alcohol abuse    Anemia    Arthritis    Carpal tunnel syndrome    CKD (chronic kidney disease) stage 3, GFR 30-59 ml/min (HCC) 07/03/2024   Gout    History of radiation therapy 04/10/2017-06/04/2017   Site/dose:   The prostatic fossa was treated to 68.4 Gy in 38 fractions of 1.8 Gy   Myelofibrosis (HCC)    Prostate cancer [facility])     Past Surgical History:  Procedure Laterality Date   COLONOSCOPY WITH PROPOFOL  N/A 09/10/2023   Procedure: COLONOSCOPY WITH PROPOFOL ;  Surgeon: Cindie Carlin POUR, DO;  [facility];  Service: Endoscopy;  Laterality: N/A;  11:30 am, asa 3   CYSTECTOMY     left bicept   IR GASTROSTOMY TUBE MOD SED  11/11/2024   IR TUNNELED CENTRAL VENOUS CATH PLC W IMG  10/28/2024   POLYPECTOMY  09/10/2023   Procedure: POLYPECTOMY;  Surgeon: Cindie Carlin POUR, DO;  [facility];  Service: Endoscopy;;   PROSTATECTOMY  2014   TENDON REPAIR     right thumb  Social History:  reports that he has never smoked. He has never been exposed to tobacco smoke. His smokeless tobacco use includes snuff. He reports that he does not currently use alcohol. He reports that he does not use drugs.  Allergies: Allergies[1]  (Not in a hospital admission)    Physical Exam: Blood pressure 136/64, pulse 80, temperature 97.8 F (36.6 C), temperature source Axillary, resp. rate (!) 23, height 6' (1.829 m), weight 59 kg, SpO2 99%.  General: chronically ill appearing male with diffuse muscle wasting. HEENT: head is normocephalic, atraumatic.  Sclera are non-icteric. Ears and nose without any obvious masses or lesions.  Heart: regular rate, no lower extremity edema  Lungs:   Respiratory effort nonlabored Abd:  Soft, ND, NT, +BS. No masses, hernias, or organomegaly GU: large sacral decubitus ulcer with exposed sacral bone. There is some fibrinous exudate central and some mature, leathery, eschar around the perimeter of the wound. There is no cellulitis, no fluctuance, no purulence. + stool that is brown with significant amount of bright red blood. MS: no BUE or BLE edema Skin: warm and dry  Psych: unable to assess Neuro: opens eyes and turns head to my voice, moans/mumbles in response to my questions but does not form words.  Results for orders placed or performed during the hospital encounter of 01/01/25 (from the past 48 hours)  Type and screen [facility]     Status: None (Preliminary result)   Collection Time: 01/01/25  6:48 PM  Result Value Ref Range   ABO/RH(D) O NEG    Antibody Screen NEG    Sample Expiration 01/04/2025,2359    Unit Number [id number]    Blood Component Type RED CELLS,LR    Unit division 00    Status of Unit ISSUED,FINAL    Transfusion Status OK TO TRANSFUSE    Crossmatch Result      Compatible Performed at [facility], [address], [place], [place] [address]    Unit Number [id number]    Blood Component Type RED CELLS,LR    Unit division 00    Status of Unit ALLOCATED    Transfusion Status OK TO TRANSFUSE    Crossmatch Result Compatible    Unit Number [id number]    Blood Component Type RED CELLS,LR    Unit division 00    Status of Unit ALLOCATED    Transfusion Status OK TO TRANSFUSE    Crossmatch Result Compatible   Comprehensive metabolic panel     Status: Abnormal   Collection Time: 01/01/25  6:49 PM  Result Value Ref Range   Sodium 137 135 - 145 mmol/L   Potassium 3.7 3.5 - 5.1 mmol/L    Comment: HEMOLYSIS AT THIS LEVEL MAY AFFECT RESULT   Chloride 98 98 - 111 mmol/L   CO2 26 22 - 32 mmol/L   Glucose, Bld 105 (H) 70 - 99 mg/dL    Comment: Glucose reference range applies only to  samples taken after fasting for at least 8 hours.   BUN 32 (H) 8 - 23 mg/dL   Creatinine, Ser 8.66 (H) 0.61 - 1.24 mg/dL   Calcium  8.8 (L) 8.9 - 10.3 mg/dL   Total Protein 7.4 6.5 - 8.1 g/dL   Albumin 2.5 (L) 3.5 - 5.0 g/dL   AST 54 (H) 15 - 41 U/L    Comment: HEMOLYSIS AT THIS LEVEL MAY AFFECT RESULT   ALT 32 0 - 44 U/L   Alkaline Phosphatase 327 (H) 38 - 126  U/L   Total Bilirubin 0.5 0.0 - 1.2 mg/dL   GFR, Estimated 57 (L) >60 mL/min    Comment: (NOTE) Calculated using the CKD-EPI Creatinine Equation (2021)    Anion gap 13 5 - 15    Comment: Performed at [facility], [address], [place], [place] [address]  CBC with Differential     Status: Abnormal   Collection Time: 01/01/25  6:49 PM  Result Value Ref Range   WBC 10.7 (H) 4.0 - 10.5 K/uL   RBC 2.58 (L) 4.22 - 5.81 MIL/uL   Hemoglobin 8.0 (L) 13.0 - 17.0 g/dL    Comment: REPEATED TO VERIFY POST TRANSFUSION SPECIMEN    HCT 24.3 (L) 39.0 - 52.0 %   MCV 94.2 80.0 - 100.0 fL   MCH 31.0 26.0 - 34.0 pg   MCHC 32.9 30.0 - 36.0 g/dL   RDW 83.0 (H) 88.4 - 84.4 %   Platelets 74 (L) 150 - 400 K/uL    Comment: REPEATED TO VERIFY PLATELET COUNT CONFIRMED BY SMEAR Immature Platelet Fraction may be clinically indicated, consider ordering this additional test [id number]    nRBC 0.6 (H) 0.0 - 0.2 %   Neutrophils Relative % 75 %   Neutro Abs 8.7 (H) 1.7 - 7.7 K/uL   Band Neutrophils 6 %   Lymphocytes Relative 7 %   Lymphs Abs 0.7 0.7 - 4.0 K/uL   Monocytes Relative 1 %   Monocytes Absolute 0.1 0.1 - 1.0 K/uL   Eosinophils Relative 1 %   Eosinophils Absolute 0.1 0.0 - 0.5 K/uL   Basophils Relative 4 %   Basophils Absolute 0.4 (H) 0.0 - 0.1 K/uL   WBC Morphology See Note     Comment: Mild Left Shift (1-5% metas, occ myelo)   RBC Morphology MORPHOLOGY UNREMARKABLE    Smear Review Normal platelet morphology    Metamyelocytes Relative 2 %   Myelocytes 4 %   Abs Immature Granulocytes 0.60 (H) 0.00 - 0.07 K/uL    Comment:  Performed at [facility], [address] [place], [place] [address]  I-Stat Chem 8, ED     Status: Abnormal   Collection Time: 01/01/25  6:57 PM  Result Value Ref Range   Sodium 137 135 - 145 mmol/L   Potassium 3.6 3.5 - 5.1 mmol/L   Chloride 100 98 - 111 mmol/L   BUN 35 (H) 8 - 23 mg/dL   Creatinine, Ser 8.59 (H) 0.61 - 1.24 mg/dL   Glucose, Bld 890 (H) 70 - 99 mg/dL    Comment: Glucose reference range applies only to samples taken after fasting for at least 8 hours.   Calcium , Ion 1.03 (L) 1.15 - 1.40 mmol/L   TCO2 28 22 - 32 mmol/L   Hemoglobin 7.1 (L) 13.0 - 17.0 g/dL   HCT 78.9 (L) 60.9 - 47.9 %  POC occult blood, ED     Status: Abnormal   Collection Time: 01/01/25  6:58 PM  Result Value Ref Range   Fecal Occult Bld POSITIVE (A) NEGATIVE  Prepare RBC (crossmatch)     Status: None   Collection Time: 01/01/25  7:30 PM  Result Value Ref Range   Order Confirmation      ORDER PROCESSED BY BLOOD BANK Performed at [facility], [address], [place], [place] [address]   Ferritin     Status: Abnormal   Collection Time: 01/01/25  8:19 PM  Result Value Ref Range   Ferritin >7,500 (  H) 24 - 336 ng/mL    Comment: Performed at [facility], [address], [place], [place] [address]  Iron and TIBC     Status: Abnormal   Collection Time: 01/01/25  8:19 PM  Result Value Ref Range   Iron 83 45 - 182 ug/dL   TIBC 790 (L) 749 - 549 ug/dL   Saturation Ratios 40 (H) 17.9 - 39.5 %   UIBC 125 ug/dL    Comment: Performed at [facility], [address] [place], [place] [address]  Vitamin B12     Status: Abnormal   Collection Time: 01/01/25  8:19 PM  Result Value Ref Range   Vitamin B-12 1,605 (H) 180 - 914 pg/mL    Comment: Performed at [facility], [address] [place], [place] [address]  CBC     Status: Abnormal   Collection Time: 01/02/25  1:30 AM  Result Value Ref Range   WBC 8.6 4.0 - 10.5 K/uL   RBC 2.83 (L) 4.22 - 5.81 MIL/uL   Hemoglobin  8.8 (L) 13.0 - 17.0 g/dL   HCT 74.7 (L) 60.9 - 47.9 %   MCV 89.0 80.0 - 100.0 fL   MCH 31.1 26.0 - 34.0 pg   MCHC 34.9 30.0 - 36.0 g/dL   RDW 84.0 (H) 88.4 - 84.4 %   Platelets 67 (L) 150 - 400 K/uL    Comment: CONSISTENT WITH PREVIOUS RESULT REPEATED TO VERIFY Immature Platelet Fraction may be clinically indicated, consider ordering this additional test [id number]    nRBC 0.4 (H) 0.0 - 0.2 %    Comment: Performed at [facility], [address] [place], [place] [address]  Renal function panel     Status: Abnormal   Collection Time: 01/02/25  1:30 AM  Result Value Ref Range   Sodium 134 (L) 135 - 145 mmol/L   Potassium 4.0 3.5 - 5.1 mmol/L   Chloride 95 (L) 98 - 111 mmol/L   CO2 28 22 - 32 mmol/L   Glucose, Bld 104 (H) 70 - 99 mg/dL    Comment: Glucose reference range applies only to samples taken after fasting for at least 8 hours.   BUN 40 (H) 8 - 23 mg/dL   Creatinine, Ser 8.30 (H) 0.61 - 1.24 mg/dL   Calcium  9.0 8.9 - 10.3 mg/dL   Phosphorus 2.0 (L) 2.5 - 4.6 mg/dL   Albumin 2.7 (L) 3.5 - 5.0 g/dL   GFR, Estimated 43 (L) >60 mL/min    Comment: (NOTE) Calculated using the CKD-EPI Creatinine Equation (2021)    Anion gap 11 5 - 15    Comment: Performed at [facility], [address] [place], [place] [address]  Hemoglobin and hematocrit, blood     Status: Abnormal   Collection Time: 01/02/25  9:27 AM  Result Value Ref Range   Hemoglobin 8.6 (L) 13.0 - 17.0 g/dL   HCT 74.4 (L) 60.9 - 47.9 %    Comment: Performed at [facility], [address], [place], [place] [address]  Urinalysis, w/ Reflex to Culture (Infection Suspected) -Urine, Catheterized     Status: Abnormal   Collection Time: 01/02/25 12:12 PM  Result Value Ref Range   Specimen Source URINE, CATHETERIZED    Color, Urine RED (A) YELLOW    Comment: BIOCHEMICALS MAY BE AFFECTED BY COLOR   APPearance TURBID (A) CLEAR   Specific Gravity, Urine 1.020 1.005 - 1.030   pH 8.0  5.0 - 8.0   Glucose,  UA (A) NEGATIVE mg/dL    TEST NOT REPORTED DUE TO COLOR INTERFERENCE OF URINE PIGMENT   Hgb urine dipstick (A) NEGATIVE    TEST NOT REPORTED DUE TO COLOR INTERFERENCE OF URINE PIGMENT   Bilirubin Urine (A) NEGATIVE    TEST NOT REPORTED DUE TO COLOR INTERFERENCE OF URINE PIGMENT   Ketones, ur (A) NEGATIVE mg/dL    TEST NOT REPORTED DUE TO COLOR INTERFERENCE OF URINE PIGMENT   Protein, ur (A) NEGATIVE mg/dL    TEST NOT REPORTED DUE TO COLOR INTERFERENCE OF URINE PIGMENT   Nitrite (A) NEGATIVE    TEST NOT REPORTED DUE TO COLOR INTERFERENCE OF URINE PIGMENT   Leukocytes,Ua (A) NEGATIVE    TEST NOT REPORTED DUE TO COLOR INTERFERENCE OF URINE PIGMENT   Squamous Epithelial / HPF 0-5 0 - 5 /HPF   WBC, UA 6-10 0 - 5 WBC/hpf    Comment: Reflex urine culture not performed if WBC <=10, OR if Squamous epithelial cells >5. If Squamous epithelial cells >5, suggest recollection.   RBC / HPF >50 0 - 5 RBC/hpf   Bacteria, UA RARE (A) NONE SEEN    Comment: Performed at [facility], [address], [place], [place] [address]  Ammonia     Status: Abnormal   Collection Time: 01/02/25 12:28 PM  Result Value Ref Range   Ammonia 42 (H) 9 - 35 umol/L    Comment: Performed at [facility], [address] [place], [place] [address]   CT ABDOMEN PELVIS WO CONTRAST Result Date: 01/02/2025 EXAM: CT ABDOMEN AND PELVIS WITHOUT CONTRAST 01/02/2025 12:07:35 PM TECHNIQUE: CT of the abdomen and pelvis was performed without the administration of intravenous contrast. Multiplanar reformatted images are provided for review. Automated exposure control, iterative reconstruction, and/or weight-based adjustment of the mA/kV was utilized to reduce the radiation dose to as low as reasonably achievable. COMPARISON: 12/08/2024 CLINICAL HISTORY: Lower GI bleed. FINDINGS: LOWER CHEST: Moderate right and small left pleural effusion with passive atelectasis. The catheter terminates in the right atrium. Low density blood pool  suggests anemia. LIVER: The liver is unremarkable. GALLBLADDER AND BILE DUCTS: Gallbladder wall thickening versus small amount of pericholecystic fluid. Gallstones noted. No biliary ductal dilatation. SPLEEN: Splenomegaly. Similar appearance of 3 cm lesion inferiorly in the spleen. PANCREAS: No acute abnormality. ADRENAL GLANDS: No acute abnormality. KIDNEYS, URETERS AND BLADDER: Mild right hydronephrosis and moderate right hydroureter. No definite ureteral stone seen. Ureteral margins indistinct distally. Stable left renal cyst. No further imaging workup is needed for this cyst. Mild to moderate left hydronephrosis and moderate left hydroureter. A Foley catheter is embedded in high density material in the lumen of the urinary bladder which was not present on 12/08/2024 and presumably represents a blood product/hematoma. Small amount of gas noted in the urinary bladder. No stones in the kidneys. No perinephric or periureteral stranding. GI AND BOWEL: Stomach demonstrates no acute abnormality. PEG (Percutaneous Endoscopic Gastrostomy) tube noted. No dilated bowel noted. Possible bowel wall thickening in the lower rectum with continued presacral edema. Cannot exclude wall thickening in the ascending colon and hepatic flexure, although the appearance could be due to peristaltic activity. There is no bowel obstruction. PERITONEUM AND RETROPERITONEUM: Suspected mild to moderate ascites. No free air. Diffuse mesenteric edema noted. VASCULATURE: Systemic atherosclerosis is present, including the aorta and iliac arteries. LYMPH NODES: No lymphadenopathy. REPRODUCTIVE ORGANS: No acute abnormality. BONES AND SOFT TISSUES: 5.1 x 2.3 cm complex fluid collection  along the right iliacus muscle on image 64 series 3, previously 6.0 x 3.5 cm on 12/08/2024. This could be hematoma or abscess. Large sacral decubitus ulcer with chronic bony destructive findings along the S5 segment of the sacrum and sacrococcygeal junction, and a 1.2 cm  gap between the lower margin of the sacrum (which may be exposed to the atmosphere) and the remaining coccyx on image 63 series 7. Lower lumbar spondylosis and degenerative disc disease. Mild subcutaneous edema. IMPRESSION: 1. Possible bowel wall thickening in the lower rectum with presacral edema, and cannot exclude wall thickening in the ascending colon and hepatic flexure. 2. Low density blood pool, which can be seen with anemia. 3. Foley catheter embedded in high-density material within the urinary bladder, presumably blood product or hematoma, new from prior, with a small amount of intravesical gas. 4. Mild right hydronephrosis with moderate right hydroureter and mild to moderate left hydronephrosis with moderate left hydroureter, without a definite ureteral stone. 5. 5.1 x 2.3 cm complex fluid collection along the right iliacus muscle, which could represent hematoma or abscess, decreased in size compared to prior. 6. Large sacral decubitus ulcer with chronic bony destructive change at the S5 segment and sacrococcygeal junction, with a 1.2 cm gap between the lower sacrum and remaining coccyx. 7. Moderate right and small left pleural effusions with passive atelectasis. 8. Gallstones with gallbladder wall thickening versus a small amount of pericholecystic fluid. 9. Suspected mild to moderate ascites with diffuse mesenteric edema and mild subcutaneous edema. 10. Splenomegaly with a similar 3 cm inferior splenic lesion. 11. Systemic atherosclerosis including the aorta and iliac arteries. 12. Lower lumbar spondylosis and degenerative disc disease. Electronically signed by: Ryan Salvage MD 01/02/2025 12:50 PM EST RP Workstation: [id number]    Anti-infectives (From admission, onward)    Start     Dose/Rate Route Frequency Ordered Stop   01/03/25 1000  meropenem  (MERREM ) 500 mg in sodium chloride  0.9 % 100 mL IVPB  Status:  Discontinued        500 mg 200 mL/hr over 30 Minutes Intravenous Every 24 hours  01/02/25 1128 01/02/25 1136   01/02/25 2200  meropenem  (MERREM ) 1 g in sodium chloride  0.9 % 100 mL IVPB  Status:  Discontinued        1 g 200 mL/hr over 30 Minutes Intravenous Every 12 hours 01/02/25 1049 01/02/25 1128   01/02/25 1000  meropenem  (MERREM ) 500 mg in sodium chloride  0.9 % 100 mL IVPB  Status:  Discontinued        500 mg 200 mL/hr over 30 Minutes Intravenous Daily 01/01/25 2214 01/02/25 1049   01/01/25 2215  meropenem  (MERREM ) 500 mg in sodium chloride  0.9 % 100 mL IVPB  Status:  Discontinued        500 mg 200 mL/hr over 30 Minutes Intravenous Daily 01/01/25 2153 01/01/25 2203   01/01/25 2200  linezolid  (ZYVOX ) IVPB 600 mg  Status:  Discontinued        600 mg 300 mL/hr over 60 Minutes Intravenous Every 12 hours 01/01/25 2153 01/02/25 1136       Assessment/Plan Sacral Wound with Chronic Osteomyelitis and complex fluid collection along the right iliacus  - Our team evaluated the wound on 1/2.  At that time we felt there is no emergent need for debridement and recommended WTD dressing changes and offload as possible.  - CT scan today w/ 5.1 x 2.3 cm complex fluid collection along the right iliacus muscle, which could represent hematoma or abscess, decreased in  size compared to prior along w/ Large sacral decubitus ulcer with chronic bony destructive change at the S5 segment and sacrococcygeal junction, with a 1.2 cm gap between the lower sacrum and remaining coccyx. - This fluid collection appears deep and not amenable to bedside drainage.  - There is eschar on the wound but at this time would not recommend any debridement as there are no signs of infection.  Continue twice daily WTD dressing changes, offloading with frequent turns and air mattress. - Abx for osteomyelitis per primary  - Agree with continued conversations with palliative for GOC.I attempted to call patients daughter, [name], without answer and the voicemail box was full.  - General Surgery will sign off.  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   Almarie Pringle, [facility] Surgery 01/02/2025, 3:57 PM Please see Amion for pager number during day hours 7:00am-4:30pm     [1] No Known Allergies  "

## 2025-01-02 NOTE — Evaluation (Signed)
 Physical Therapy Evaluation Patient Details Name: Ricardo [name] MRN: [id number] DOB: 1951-06-26 Today's Date: 01/02/2025  History of Present Illness  Ricardo [name] is a 74 y.o. male presenting with rectal bleeding. W/u for GIB.  Recently admitted with septic shock in Aug 2025, on dialysis, multiple intubations and PEG placement. Sent to [facility] 10/01/2024. For his chronic sacral wounds, a bedside debridement was done on 12/25/2024.  PMHx of JAK2+ primary myelofibrosis (transfusion dependent follows with heme/onc at the [facility]), prostate cancer, COPD, chronic hypoxic respiratory failure, A-fib, insomnia, gout, and ESRD on HD, PUD  Clinical Impression  Pt admitted with above diagnosis. Pt was only able to complete bed level evaluation today due to low tolerance of movement and decr ability to follow commands. Pt opened eyes to commands and did perform a few exercises to command with poor endurance and decr strength for activities. Will benefit from post acute rehab < 3 hours day.  Pt has been in [facility] prior to this admission since 10/01/2024.  Will follow acutely.  Pt currently with functional limitations due to the deficits listed below (see PT Problem List). Pt will benefit from acute skilled PT to increase their independence and safety with mobility to allow discharge.         If plan is discharge home, recommend the following: Two people to help with walking and/or transfers;Assistance with cooking/housework;Two people to help with bathing/dressing/bathroom;Assistance with feeding;Assist for transportation;Help with stairs or ramp for entrance;Supervision due to cognitive status   Can travel by private vehicle        Equipment Recommendations Other (comment) (TBA)  Recommendations for Other Services       Functional Status Assessment Patient has had a recent decline in their functional status and demonstrates the ability to make significant improvements in function in a  reasonable and predictable amount of time.     Precautions / Restrictions Precautions Precautions: Fall Precaution/Restrictions Comments: Sacral ulcer, foley catheter Restrictions Weight Bearing Restrictions Per Provider Order: No      Mobility  Bed Mobility Overal bed mobility: Needs Assistance Bed Mobility: Rolling Rolling: Total assist, Used rails         General bed mobility comments: needs total assist to roll and will need +2 to get to EOB.    Transfers                        Ambulation/Gait                  Stairs            Wheelchair Mobility     Tilt Bed    Modified Rankin (Stroke Patients Only)       Balance                                             Pertinent Vitals/Pain Pain Assessment Pain Assessment: Faces Faces Pain Scale: Hurts even more Pain Location: generalized Pain Descriptors / Indicators: Discomfort, Grimacing, Guarding Pain Intervention(s): Limited activity within patient's tolerance, Monitored during session, Repositioned    Home Living Family/patient expects to be discharged to:: Other (Comment) (Was at [facility] and likely will return)                        Prior Function Prior Level of Function : Independent/Modified  Independent             Mobility Comments: Prior to Aug 2025, pt was a tourist information centre manager, no AD.  Since Aug 2025 has been in hospitals[facility] doing Rehab ADLs Comments: Independent prior to Aug 2025     Extremity/Trunk Assessment   Upper Extremity Assessment Upper Extremity Assessment: Defer to OT evaluation    Lower Extremity Assessment Lower Extremity Assessment: RLE deficits/detail;LLE deficits/detail RLE Deficits / Details: hip 3-/5; knee 2+/5, ankle 2-/5 LLE Deficits / Details: hip 3-/5; knee 2+/5, ankle 2-/5       Communication   Communication Communication: Impaired Factors Affecting Communication: Difficulty expressing  self;Reduced clarity of speech    Cognition Arousal: Alert Behavior During Therapy: Flat affect   PT - Cognitive impairments: Difficult to assess, Orientation, Awareness, Memory, Sequencing, Problem solving, Safety/Judgement   Orientation impairments: Place, Time, Situation                   PT - Cognition Comments: opened eyes when name called, moved UE and LEs to command with incr time and repetition. Did not speak Following commands: Impaired Following commands impaired: Follows one step commands with increased time, Follows one step commands inconsistently     Cueing Cueing Techniques: Tactile cues, Verbal cues     General Comments General comments (skin integrity, edema, etc.): Noted blood dripping off end of penis, nurse made aware.    Exercises General Exercises - Upper Extremity Shoulder Flexion: AAROM, Both, 5 reps, Supine Elbow Flexion: Both, 10 reps, Supine, AAROM Elbow Extension: AAROM, Both, 5 reps, Supine General Exercises - Lower Extremity Ankle Circles/Pumps: AAROM, Both, 5 reps, Supine Heel Slides: AAROM, Both, 5 reps, Supine Other Exercises Other Exercises: Hip extension AAROM, both LEs, supine 5 reps   Assessment/Plan    PT Assessment Patient needs continued PT services  PT Problem List Decreased activity tolerance;Decreased balance;Decreased mobility;Decreased strength;Decreased range of motion;Decreased knowledge of use of DME;Decreased safety awareness;Decreased knowledge of precautions;Cardiopulmonary status limiting activity;Pain;Decreased skin integrity       PT Treatment Interventions DME instruction;Functional mobility training;Therapeutic activities;Therapeutic exercise;Balance training;Patient/family education    PT Goals (Current goals can be found in the Care Plan section)  Acute Rehab PT Goals Patient Stated Goal: unable to state PT Goal Formulation: Patient unable to participate in goal setting Time For Goal Achievement:  01/16/25 Potential to Achieve Goals: Fair    Frequency Min 2X/week     Co-evaluation               AM-PAC PT 6 Clicks Mobility  Outcome Measure Help needed turning from your back to your side while in a flat bed without using bedrails?: Total Help needed moving from lying on your back to sitting on the side of a flat bed without using bedrails?: Total Help needed moving to and from a bed to a chair (including a wheelchair)?: Total Help needed standing up from a chair using your arms (e.g., wheelchair or bedside chair)?: Total Help needed to walk in hospital room?: Total Help needed climbing 3-5 steps with a railing? : Total 6 Click Score: 6    End of Session   Activity Tolerance: Patient limited by fatigue;Patient limited by pain Patient left: in bed;with call bell/phone within reach;with bed alarm set;with nursing/sitter in room;with restraints reapplied Nurse Communication: Mobility status;Need for lift equipment PT Visit Diagnosis: Muscle weakness (generalized) (M62.81);Other abnormalities of gait and mobility (R26.89)    Time: 1131-1150 PT Time Calculation (min) (ACUTE ONLY): 19 min  Charges:   PT Evaluation $PT Eval Moderate Complexity: 1 Mod   PT General Charges $$ ACUTE PT VISIT: 1 Visit         Ermagene Saidi M,PT Acute Rehab Services [phone]   Stephane JULIANNA Bevel 01/02/2025, 3:36 PM

## 2025-01-02 NOTE — Procedures (Signed)
 Procedure: Foley placement and irrgation with clot evacuation.  Dx: Clot retention.  Surgeon: Dr. Norleen Seltzer  Anesthesia: none  Indications: Obstructed catheter with clot retention.  Procedure:  Initially I attempted to irrigation th 3fr catheter but was only able to get a small amount of clot out and he leaked blood and clot around the foley.  I removed the foley and he passed a 5ml clot.  I the prepped him with betadine and passed a lubricated 57fr 3 way but was unable to get that catheter to irrigate.  I then passed a 47fr hematuria foley and was able to irrigate out a large volume of clot.  10ml was left in the balloon and the foley was placed to a drainage bag and the irrigation port was plugged.    There were no complications.

## 2025-01-02 NOTE — Progress Notes (Signed)
 OT Cancellation Note  Patient Details Name: Ricardo [name] MRN: [id number] DOB: [birth year]   Cancelled Treatment:    Reason Eval/Treat Not Completed: Patient at procedure or test/ unavailable (Pt in CT. Will await pt transfer to medical unit to initiate OT evaluation.)  Kennth Mliss Helling 01/02/2025, 11:49 AM [name], OTR/L Acute Rehabilitation Services Office: [phone]

## 2025-01-02 NOTE — Consult Note (Addendum)
 WOC Nurse Consult Note: Reason for Consult: Requested to assess DTI on Nose/L ear and wound sacrum. Pressure Injury POA: Yes Wound type: DTI on L ear Measurement: 0.8 cm x 0.3 cm. Wound bed: purplish skin intact. Drainage (amount, consistency, odor) NONE Periwound: Intact Dressing procedure/placement/frequency: Offload the area with foam dressing, change every 3 days or PRN, reassess every shift.     Wound type:No skin injury on nose. No dressing recommendation.   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 Approx. 20 cm x 20 cm x 2 cm. Wound bed: 80% brown/black eschar. 10% red in the center of the wound, 10% yellow slough. Drainage (amount, consistency, odor) Minimum amount. Periwound: non viable edges, indurated. Dressing procedure/placement/frequency: Cleanse the wound with Vashe 8 oz #[id number], not rinse. Apply a soak gauze with Vashe. Top with sacral foam dressing. Change daily or PRN.    Addendum: Patient is in palliative care. Although, if its pertinent to this case, I suggest an evaluation with surgical team for a surgical debridement. Last time performed 01/02.  WOC team will not plan to follow further. Please reconsult if further assistance is needed. Thank-you,  Lela Holm MSN, RN, CWCN, CNS.  (Phone [phone])

## 2025-01-02 NOTE — Subjective & Objective (Signed)
 I have seen and examined this patient, and reviewed their chart. I have discussed this patient with the resident. I agree with the resident's findings, assessment and care plan. Principal Problem:   GI bleed             - Acute on chronic multifactorial anemia                        - Primary Myelofibrosis, Anemia of renal disease and Severe protein-caloric malnutrition production impairment,                       - MRI pelvis 12/10/24 showed 8 x 6 x 3 cm intramuscular hematoma within right iliacus muscle.                         - Blood losses from BRBPR and Gross Hematuria                 -   Colonoscopy on 09/10/23 with Carlin Hasty, DO found  a 3 mm cecal polyp which was removed but not retrieved, non-bleeding internal hemorrhoids, but otherwise normal.                   - EGD on 09/16/2024 which showed grossly normal esophagus, GE junction, stomach, and duodenum. PEG tube was placed             Active Problems:   Primary myelofibrosis (HCC)     ESRD (end stage renal disease) on dialysis (HCC)    Hematuria w/ Chronic foley                  - 07/08/24  CT AP with contrast found no significant upper tract abnormalities.  12/08/24 CT AP without contrast had normal ureters.                   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     Plan pRBC transfusion, monitoring Hg, threshold < 7.0 Nephrology consultation Patient may benefit from suprapubic bladder catheter when seen by Urology  outpatient Follow up iliacus muscle hematoma for enlargement if pain or blood loss continues.  GI consultation Meropenan and Linezolid  IV Wound care consultation Transitions of Care consultation for disposition planning.  RD consultation Palliative Care consultation

## 2025-01-02 NOTE — Assessment & Plan Note (Addendum)
 Intermittent blood in urine in the past 4-6 months. Likely r/t primary myofibrosis. Urology rec' to f/u outpatient frm last admission    - Foley placed 01/15 in ED  - UA w/ urine Cx ordered w/ pending result

## 2025-01-02 NOTE — Progress Notes (Signed)
 New Admission Note:   Arrival Method: bed Mental Orientation: non-verbal Telemetry: SR Assessment: Completed Skin: stage 4 to coccyx IV: SL Pain: denies Tubes: Safety Measures: Safety Fall Prevention Plan has been given, discussed and signed Admission: Completed [facility] Orientation: Patient has been orientated to the room, unit and staff.  Family: not present  Orders have been reviewed and implemented. Will continue to monitor the patient. Call light has been placed within reach and bed alarm has been activated.   Doyal Sias, RN

## 2025-01-02 NOTE — Assessment & Plan Note (Addendum)
 Not currently on anticoagulation d/t his high risk of bleeding - Continue home diltiazem  90mg  and metoprolol  50mg  BID once CTAP negative

## 2025-01-02 NOTE — Plan of Care (Addendum)
 Discussed with Dr. Watt (urology) regarding CT findings of hematoma in bladder as well as hydronephrosis and hematuria - he will evaluate the patient today. In the meantime place [address] foley and hand irrigate. Discussed plan w/ RN   Also consulted surgery regarding sacral ulcer with WOC recommending surgical debridement and CT findings -- they will evaluate the patient  Payton Coward, MD 3:51 PM 01/02/25

## 2025-01-02 NOTE — Consult Note (Signed)
 ESRD Consult Note  Requesting provider: FMTS  Reason for consult: ESRD, provision of dialysis  Assessment/Recommendations:  Dialysis dependent AKI  -has been on renal replacement therapy since August 2025 (since [facility] to [facility]). Has been doing HD TTS at [facility] -may be deemed as ESRD, let's watch for any semblence of renal recovery in the interim before deeming as ESRD therefore please monitor strict I/O & daily labs -HD on TTS schedule, tomorrow  Anemia of Chronic Kidney Disease ABLA, GI Bleed Transfusion dependent myelofibrosis -transfuse prn for hgb <7 -GI following  Hematuria -intermittent issue, h/o prostate Ca per chart -consider urology consult if needed  Chronic osteomyelitis -on meropenem  and linezolid   until 01/31/24 -per primary  Severe protein-calorie malnutirion -with PEG -RD consult recommended  Secondary hyperparathyroidism -monitor phos  GOC -palliative following  # Additional recommendations: - Dose all meds for creatinine clearance < 10 ml/min  - Unless absolutely necessary, no MRIs with gadolinium.  - Implement save arm precautions.  Prefer needle sticks in the dorsum of the hands or wrists.  No blood pressure measurements in arm. - If blood transfusion is requested during hemodialysis sessions, please alert us  prior to the session.  - If a hemodialysis catheter line culture is requested, please alert us  as only hemodialysis nurses are able to collect those specimens.   Recommendations were discussed with the primary team.  Ephriam Stank, MD [facility]  History of Present Illness: Ricardo [name] is a/an 74 y.o. male with a past medical history of dialysis dependent AKI, myelofibrosis (transfusion dependent), prostate cancer, COPD, chronic hypoxic respiratory failure, A-fib, PUD, gout who presents with rectal bleeding.  He was recently admitted with septic shock, [facility].  Recently had hematuria and GI bleed.  Found to  have a hemoglobin of 5.5 this morning.  Had a large bloody bowel movement after dialysis. Hemoglobin 5.5 in the ER, received 2 units PRBC Dialysis history: started on CRRT on 08/06/24 at [facility], has been requiring renal replacement therapy since then. TDC placed 10/28/24. Has been on HD TTS. Last HD 01/01/25. Patient seen in room. ROS unobtainable.    Medications:  Current Facility-Administered Medications  Medication Dose Route Frequency Provider Last Rate Last Admin   acetaminophen  (TYLENOL ) tablet 1,000 mg  1,000 mg Per Tube Q6H PRN Lupie Credit, DO   1,000 mg at 01/02/25 1036   Or   acetaminophen  (TYLENOL ) suppository 650 mg  650 mg Rectal Q6H PRN Lupie Credit, DO       diltiazem  (CARDIZEM ) tablet 90 mg  90 mg Per Tube Q8H Majeed, Sara, DO   90 mg at 01/02/25 0550   free water  30 mL  30 mL Per Tube Q2H Majeed, Sara, DO   30 mL at 01/02/25 1021   gabapentin  (NEURONTIN ) 250 MG/5ML solution 100 mg  100 mg Per Tube QHS Lupie Credit, DO   100 mg at 01/02/25 0011   latanoprost  (XALATAN ) 0.005 % ophthalmic solution 1 drop  1 drop Both Eyes QHS Majeed, Sara, DO       levothyroxine  (SYNTHROID ) tablet 25 mcg  25 mcg Per Tube Q0600 Lupie Credit, DO   25 mcg at 01/02/25 0550   metoprolol  tartrate (LOPRESSOR ) tablet 50 mg  50 mg Per Tube BID Lupie Credit, DO   50 mg at 01/02/25 0914   midodrine  (PROAMATINE ) tablet 10 mg  10 mg Per Tube TID WC Majeed, Sara, DO   10 mg at 01/02/25 9085   oxyCODONE  (Oxy IR/ROXICODONE ) immediate release tablet  5 mg  5 mg Per Tube Q6H PRN Cooper, Josseline P, PA-C       pantoprazole  (PROTONIX ) injection 40 mg  40 mg Intravenous Q24H Majeed, Sara, DO       sertraline  (ZOLOFT ) tablet 50 mg  50 mg Per Tube Daily Majeed, Sara, DO   50 mg at 01/02/25 9085   traZODone  (DESYREL ) tablet 100 mg  100 mg Per Tube QHS Lupie Credit, DO   100 mg at 01/02/25 0011   Current Outpatient Medications  Medication Sig Dispense Refill   acetaminophen  (TYLENOL ) 325 MG tablet Place 650 mg into  feeding tube every 6 (six) hours as needed for mild pain (pain score 1-3).     ALPRAZolam (XANAX) 0.5 MG tablet Take 0.5 mg by mouth at bedtime as needed.     cyanocobalamin  1000 MCG tablet Place 1,000 mcg into feeding tube daily.     diltiazem  (CARDIZEM ) 90 MG tablet Place 90 mg into feeding tube 4 (four) times daily.     gabapentin  (NEURONTIN ) 100 MG capsule Give 100mg  per tube nightly     latanoprost  (XALATAN ) 0.005 % ophthalmic solution Apply 1 drop to eye at bedtime.     levothyroxine  (SYNTHROID ) 25 MCG tablet Place 25 mcg into feeding tube daily before breakfast.     linezolid  (ZYVOX ) 600 MG tablet Take 600 mg by mouth 2 (two) times daily.     melatonin 3 MG TABS tablet Place 3 mg into feeding tube at bedtime as needed (for sleep).     Meropenem  (MERREM  IV) Inject 500 mg into the vein daily. In sterile water  IV syringe     metoprolol  tartrate (LOPRESSOR ) 50 MG tablet Place 50 mg into feeding tube 2 (two) times daily.     midodrine  (PROAMATINE ) 10 MG tablet Place 10 mg into feeding tube 3 (three) times daily.     omeprazole 20 MG TBDD disintegrating tablet Give 40mg  per tube daily     oxyCODONE  (OXY IR/ROXICODONE ) 5 MG immediate release tablet Place 5 mg into feeding tube every 8 (eight) hours as needed for severe pain (pain score 7-10).     polyethylene glycol (MIRALAX  / GLYCOLAX ) 17 g packet Place 17 g into feeding tube daily.     senna (SENOKOT) 8.6 MG TABS tablet Place 1 tablet into feeding tube at bedtime.     sertraline  (ZOLOFT ) 50 MG tablet Place 50 mg into feeding tube daily.     Sodium Hypochlorite (DAKINS, 1/2 STRENGTH, EX) Apply 1 application  topically daily.     traZODone  (DESYREL ) 100 MG tablet Place 100 mg into feeding tube at bedtime.     UNABLE TO FIND Inject 100 mcg into the vein every 14 (fourteen) days. Micera     dextrose  50 % solution Inject 12.5 g into the vein as needed for low blood sugar. 540mL/hr (Patient not taking: Reported on 01/01/2025)     dextrose  50 %  solution Inject 25 g into the vein as needed for low blood sugar. 108mL/hr (Patient not taking: Reported on 01/01/2025)     glucagon Emergency 1 MG SOLR Inject 1 mg into the muscle as needed (for hyperglycemia). (Patient not taking: Reported on 01/01/2025)     glucose 4 GM chewable tablet Chew 3 tablets by mouth as needed for low blood sugar. (Patient not taking: Reported on 01/01/2025)     insulin lispro (HUMALOG) 100 UNIT/ML injection Inject 0-12 Units into the skin every 6 (six) hours. (Patient not taking: Reported on 01/01/2025)  sodium chloride  0.9 % infusion 0-400 mLs as needed. During dialysis (Patient not taking: Reported on 01/01/2025)     UNABLE TO FIND Erythropoietin as needed per policy (Patient not taking: Reported on 01/01/2025)       ALLERGIES Patient has no known allergies.  MEDICAL HISTORY Past Medical History:  Diagnosis Date   Alcohol abuse    Anemia    Arthritis    Carpal tunnel syndrome    CKD (chronic kidney disease) stage 3, GFR 30-59 ml/min (HCC) 07/03/2024   Gout    History of radiation therapy 04/10/2017-06/04/2017   Site/dose:   The prostatic fossa was treated to 68.4 Gy in 38 fractions of 1.8 Gy   Myelofibrosis (HCC)    Prostate cancer (HCC)      SOCIAL HISTORY Social History   Socioeconomic History   Marital status: Divorced    Spouse name: Not on file   Number of children: Not on file   Years of education: Not on file   Highest education level: Not on file  Occupational History   Not on file  Tobacco Use   Smoking status: Never    Passive exposure: Never   Smokeless tobacco: Current    Types: Snuff  Vaping Use   Vaping status: Never Used  Substance and Sexual Activity   Alcohol use: Not Currently    Comment: last has ETOH 7 years ago as of 09/07/2023   Drug use: No   Sexual activity: Not Currently  Other Topics Concern   Not on file  Social History Narrative   Not on file   Social Drivers of Health   Tobacco Use: High Risk (12/19/2024)    Patient History    Smoking Tobacco Use: Never    Smokeless Tobacco Use: Current    Passive Exposure: Never  Financial Resource Strain: Low Risk  (10/02/2024)   Received from [facility]   Overall Financial Resource Strain (CARDIA)    Difficulty of Paying Living Expenses: Not hard at all  Food Insecurity: No Food Insecurity (10/02/2024)   Received from [facility]   Epic    Within the past 12 months, you worried that your food would run out before you got the money to buy more.: Never true    Within the past 12 months, the food you bought just didn't last and you didn't have money to get more.: Never true  Transportation Needs: No Transportation Needs (10/01/2024)   Received from [facility]   SM SDOH Transportation Source    Has lack of transportation kept you from medical appointments or from getting medications?: No    Has lack of transportation kept you from meetings, work, or from getting things needed for daily living?: No  Physical Activity: Not on file  Stress: No Stress Concern Present (10/01/2024)   Received from [facility]   [employer] of Occupational Health - Occupational Stress Questionnaire    Feeling of Stress : Not at all  Recent Concern: Stress - Stress Concern Present (08/03/2024)   Received from [facility] of Occupational Health - Occupational Stress Questionnaire    Do you feel stress - tense, restless, nervous, or anxious, or unable to sleep at night because your mind is troubled all the time - these days?: To some extent  Social Connections: Socially Isolated (10/02/2024)   Received from [facility]   Social Connection and Isolation Panel    In a typical week, how many times do you talk on the  phone with family, friends, or neighbors?: Three times a week    How often do you get together with friends or relatives?: Three times a week    How often do you attend church or religious services?: Never    Do you belong to  any clubs or organizations such as church groups, unions, fraternal or athletic groups, or school groups?: No    How often do you attend meetings of the clubs or organizations you belong to?: Never    Are you married, widowed, divorced, separated, never married, or living with a partner?: Widowed  Intimate Partner Violence: Not At Risk (10/01/2024)   Received from [facility]   Domestic Abuse Assessment    Do you feel safe in your relationships at home?: Yes    Physical Abuse: Denies    HRSN Domestic Abuse - Type of Abuse: Not on file    HRSN Domestic Abuse - Time Frame: Not on file    HRSN Domestic Abuse - Signs and Symptoms: Not on file    Verbal Abuse: Denies    HRSN Domestic Abuse - Reported To: Not on file  Depression (PHQ2-9): Not on file  Alcohol Screen: Not on file  Housing: Low Risk  (10/02/2024)   Received from [facility]   Epic    In the last 12 months, was there a time when you were not able to pay the mortgage or rent on time?: No    In the past 12 months, how many times have you moved where you were living?: 0    At any time in the past 12 months, were you homeless or living in a shelter (including now)?: No  Utilities: Not At Risk (10/02/2024)   Received from [facility]   [employer] Utilities    Threatened with loss of utilities: No  Recent Concern: Utilities - At Risk (08/03/2024)   Received from [facility]    In the past 12 months has the electric, gas, oil, or water  company threatened to shut off services in your home?: Yes  Health Literacy: Not on file     FAMILY HISTORY Family History  Problem Relation Age of Onset   Heart disease Mother    Hypertension Sister    Cancer Maternal Aunt        breast   Cancer Maternal Uncle        colon   Cancer Maternal Uncle        lung     Review of Systems: 12 systems were reviewed and negative except per HPI  Physical Exam: Vitals:   01/02/25 1044 01/02/25 1100  BP: (!) 114/51 (!) 117/46  Pulse:  83 85  Resp: 15 (!) 21  Temp: 97.8 F (36.6 C)   SpO2: 100% 97%   Total I/O In: 60 [NG/GT:60] Out: -   Intake/Output Summary (Last 24 hours) at 01/02/2025 1146 Last data filed at 01/02/2025 1021 Gross per 24 hour  Intake 805 ml  Output --  Net 805 ml   General: no acute distress, chronically ill appearing, cachectic HEENT: anicteric sclera, MMM CV: normal rate, no murmurs, no edema Lungs: bilateral chest rise, normal wob Abd: soft, non-tender, non-distended Skin: no visible lesions or rashes Neuro: awake, not really following any commands Dialysis access: [facility]  Test Results Reviewed Lab Results  Component Value Date   NA 134 (L) 01/02/2025   K 4.0 01/02/2025   CL 95 (L) 01/02/2025   CO2 28 01/02/2025  BUN 40 (H) 01/02/2025   CREATININE 1.69 (H) 01/02/2025   CALCIUM  9.0 01/02/2025   ALBUMIN 2.7 (L) 01/02/2025   PHOS 2.0 (L) 01/02/2025    I have reviewed relevant outside healthcare records

## 2025-01-02 NOTE — Progress Notes (Signed)
 "    Daily Progress Note Intern Pager: [phone]  Patient name: Ricardo [name] Medical record number: [id number] Date of birth: [birth year] Age: 74 y.o. 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 He was admitted from [facility] which he has been resigned after hospitalized w/ Sepsis complicated by sacral wound, sacral osteomyelitis, E Coli peritonitis, and encephalopathy for concerning of GI bleed Assessment & Plan GI bleed Primary myelofibrosis (HCC) Received 2u pRBC, 1u pRBC in ED. Stable H/H after blood transfusion.  - GI consult  - Suspected bleeding from ischemic colitis   - Pending CTAP w/ Oral contrast  - No EGD plan as pt is a poor candidate for sedation  - Trend H/H   - Ok to resume TF if CTAP negative - Continue IV Protonix  40 mg Daily  - Pain control: Tylenol , Oxycodone  - Palliative consult and follow : GOC conversation this AM with his daughter - PT/OT evaluation  - Fall precautions initiated - Delirium precautions initiated - AM RFP, CBC, PT/INR, Fibrinogen  Hematuria Intermittent blood in urine in the past 4-6 months. Likely r/t primary myofibrosis. Urology rec' to f/u outpatient frm last admission    - Foley placed 01/15 in ED  - UA w/ urine Cx ordered w/ pending result ESRD (end stage renal disease) on dialysis (HCC) T/Th/Sat HD schedule. 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  He has been on IV Meropenam and Lizolid which ended date initially plan for 01/30/25 for the total of 6 week course - STOP IV meropenum 500mg  and IV linezolid  600mg  d/t thrombocytopenia  plt 67 this AM - Q 2 Turning while in bed and Q15 min while in chair - Wound care consult and follow, appreciate recommendation - PT/OT consult Atrial fibrillation (HCC) Not currently on anticoagulation d/t his high risk of bleeding - Continue home diltiazem  90mg  and metoprolol  50mg  BID once CTAP negative  Severe protein-calorie malnutrition PEG (percutaneous endoscopic gastrostomy) status (HCC) Underweight (BMI < 18.5) Dietitian consult, appreciate recommendation - Previous recommendation as follow -1 packet fiber supplement TID - 1 packet Juven BID - Strict NPO diet - Nepro tube feeding continuous rate 49mL/hr - Tube feeding water  flush 30mL every 2 hours; order placed  - HOLD tube feeds 1 hour before and after levothyroxine  Chronic health problem Via tube Medications on hold pending CTAP H/o metabolic encephalopathy and insomnia: continue home trazodone  100mg  per tube at bedtime and home sertraline  50mg  per tube daily  Hypotension: continue home midodrine  10mg  per tube TID Hypothyroidism: continue home levothyroxine  25mcg per tube daily Neuropathy: continue gabapentin  100mg  per tube QHS  FEN/GI: NPO PPx: None Dispo:Pending clinical improvement  Subjective:    Objective: Temp:  [97.8 F (36.6 C)-98.6 F (37 C)] 98.6 F (37 C) (01/16 0649) Pulse Rate:  [86-106] 95 (01/16 0715) Resp:  [17-37] 24 (01/16 0715) BP: (110-137)/(42-97) 130/51 (01/16 0715) SpO2:  [95 %-100 %] 99 % (  01/16 0715) Weight:  [59 kg] 59 kg (01/15 1821)  Physical Exam Constitutional:      Appearance: He is ill-appearing.  Cardiovascular:     Rate and Rhythm: Normal rate. Rhythm irregular.     Pulses: Normal pulses.  Pulmonary:      Effort: Pulmonary effort is normal.  Abdominal:     Palpations: Abdomen is soft.     Comments: PEG CDI  Skin:    Findings: Wound present.     Comments: Sacral wound see Media      Laboratory: Most recent CBC Lab Results  Component Value Date   WBC 8.6 01/02/2025   HGB 8.8 (L) 01/02/2025   HCT 25.2 (L) 01/02/2025   MCV 89.0 01/02/2025   PLT 67 (L) 01/02/2025   Most recent BMP    Latest Ref Rng & Units 01/02/2025    1:30 AM  BMP  Glucose 70 - 99 mg/dL 895   BUN 8 - 23 mg/dL 40   Creatinine 9.38 - 1.24 mg/dL 8.30   Sodium 864 - 854 mmol/L 134   Potassium 3.5 - 5.1 mmol/L 4.0   Chloride 98 - 111 mmol/L 95   CO2 22 - 32 mmol/L 28   Calcium  8.9 - 10.3 mg/dL 9.0      Ricardo Elder B, DO 01/02/2025, 7:52 AM  PGY-1, [facility] FPTS Intern pager: [phone], text pages welcome Secure chat group [facility] Teaching Service   "

## 2025-01-02 NOTE — Consult Note (Signed)
 " Subjective:    Consult requested by Dr. Rollene Keeling  I was asked to see Ricardo [name] emergently for an obstructed foley from probable clot.   He was seen in the ED from [facility] on 12/19/24 for blood from the penis since 12/06/24 despite UTI treatment.  He was recommended outpatient f/u.   The patient is non-communicative and there is an indication in the ER note from 01/01/25 that he has been seen by urology  and has been getting bladder irrigation but I can find no record of that in EPIC, Care Everywhere or our office noted.  He had a CT today that showed a foley in the bladder surrounded by high density material consistent with blood and there was moderate left and mild right hydronephrosis.  There is a mention of a prostatectomy 10+ years ago in his history but he has a prostate present on CT so he may have had a TURP.  He has had several recent UTI's.  He has thrombocytopenia and anemia and has myelofibrosis.  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  His cure Cr is 1.69.  ROS:  Review of Systems  Unable to perform ROS: Patient nonverbal    Allergies[1]  Past Medical History:  Diagnosis Date   Alcohol abuse    Anemia    Arthritis    Carpal tunnel syndrome    CKD (chronic kidney disease) stage 3, GFR 30-59 ml/min (HCC) 07/03/2024   Gout    History of radiation therapy 04/10/2017-06/04/2017   Site/dose:   The prostatic fossa was treated to 68.4 Gy in 38 fractions of 1.8 Gy   Myelofibrosis (HCC)    Prostate cancer [facility])     Past Surgical History:  Procedure Laterality Date   COLONOSCOPY WITH PROPOFOL  N/A 09/10/2023   Procedure: COLONOSCOPY WITH PROPOFOL ;  Surgeon: Cindie Carlin POUR, DO;  Location: [facility];  Service: Endoscopy;  Laterality: N/A;  11:30 am, asa 3   CYSTECTOMY     left bicept   IR GASTROSTOMY TUBE MOD SED  11/11/2024   IR TUNNELED CENTRAL VENOUS CATH PLC W IMG  10/28/2024   POLYPECTOMY  09/10/2023   Procedure: POLYPECTOMY;   Surgeon: Cindie Carlin POUR, DO;  Location: [facility];  Service: Endoscopy;;   PROSTATECTOMY  2014   TENDON REPAIR     right thumb    Social History   Socioeconomic History   Marital status: Divorced    Spouse name: Not on file   Number of children: Not on file   Years of education: Not on file   Highest education level: Not on file  Occupational History   Not on file  Tobacco Use   Smoking status: Never    Passive exposure: Never   Smokeless tobacco: Current    Types: Snuff  Vaping Use   Vaping status: Never Used  Substance and Sexual Activity   Alcohol use: Not Currently    Comment: last has ETOH 7 years ago as of 09/07/2023   Drug use: No   Sexual activity: Not Currently  Other Topics Concern   Not on file  Social History Narrative   Not on file   Social Drivers of Health   Tobacco Use: High Risk (01/02/2025)   Patient History    Smoking Tobacco Use: Never    Smokeless Tobacco Use: Current    Passive Exposure: Never  Financial Resource Strain: Low Risk  (10/02/2024)   Received from [facility]  Overall Financial Resource Strain (CARDIA)    Difficulty of Paying Living Expenses: Not hard at all  Food Insecurity: No Food Insecurity (01/02/2025)   Epic    Worried About Programme Researcher, Broadcasting/film/video in the Last Year: Never true    Ran Out of Food in the Last Year: Never true  Transportation Needs: No Transportation Needs (01/02/2025)   Epic    Lack of Transportation (Medical): No    Lack of Transportation (Non-Medical): No  Physical Activity: Not on file  Stress: No Stress Concern Present (10/01/2024)   Received from [facility]   [employer] of Occupational Health - Occupational Stress Questionnaire    Feeling of Stress : Not at all  Recent Concern: Stress - Stress Concern Present (08/03/2024)   Received from [facility] of Occupational Health - Occupational Stress Questionnaire    Do you feel stress - tense, restless, nervous, or  anxious, or unable to sleep at night because your mind is troubled all the time - these days?: To some extent  Social Connections: Patient Unable To Answer (01/02/2025)   Social Connection and Isolation Panel    Frequency of Communication with Friends and Family: Patient unable to answer    Frequency of Social Gatherings with Friends and Family: Patient unable to answer    Attends Religious Services: Patient unable to answer    Active Member of Clubs or Organizations: Patient unable to answer    Attends Banker Meetings: Patient unable to answer    Marital Status: Patient unable to answer  Intimate Partner Violence: Not At Risk (01/02/2025)   Epic    Fear of Current or Ex-Partner: No    Emotionally Abused: No    Physically Abused: No    Sexually Abused: No  Depression (PHQ2-9): Not on file  Alcohol Screen: Not on file  Housing: Low Risk (01/02/2025)   Epic    Unable to Pay for Housing in the Last Year: No    Number of Times Moved in the Last Year: 0    Homeless in the Last Year: No  Utilities: Not At Risk (01/02/2025)   Epic    Threatened with loss of utilities: No  Health Literacy: Not on file    Family History  Problem Relation Age of Onset   Heart disease Mother    Hypertension Sister    Cancer Maternal Aunt        breast   Cancer Maternal Uncle        colon   Cancer Maternal Uncle        lung    Anti-infectives: Anti-infectives (From admission, onward)    Start     Dose/Rate Route Frequency Ordered Stop   01/03/25 1000  meropenem  (MERREM ) 500 mg in sodium chloride  0.9 % 100 mL IVPB  Status:  Discontinued        500 mg 200 mL/hr over 30 Minutes Intravenous Every 24 hours 01/02/25 1128 01/02/25 1136   01/02/25 2200  meropenem  (MERREM ) 1 g in sodium chloride  0.9 % 100 mL IVPB  Status:  Discontinued        1 g 200 mL/hr over 30 Minutes Intravenous Every 12 hours 01/02/25 1049 01/02/25 1128   01/02/25 1000  meropenem  (MERREM ) 500 mg in sodium chloride  0.9 %  100 mL IVPB  Status:  Discontinued        500 mg 200 mL/hr over 30 Minutes Intravenous Daily 01/01/25 2214 01/02/25 1049   01/01/25 2215  meropenem  (MERREM ) 500 mg in sodium chloride  0.9 % 100 mL IVPB  Status:  Discontinued        500 mg 200 mL/hr over 30 Minutes Intravenous Daily 01/01/25 2153 01/01/25 2203   01/01/25 2200  linezolid  (ZYVOX ) IVPB 600 mg  Status:  Discontinued        600 mg 300 mL/hr over 60 Minutes Intravenous Every 12 hours 01/01/25 2153 01/02/25 1136       Current Facility-Administered Medications  Medication Dose Route Frequency Provider Last Rate Last Admin   acetaminophen  (TYLENOL ) tablet 1,000 mg  1,000 mg Per Tube Q6H PRN Lupie Credit, DO   1,000 mg at 01/02/25 1036   Or   acetaminophen  (TYLENOL ) suppository 650 mg  650 mg Rectal Q6H PRN Lupie Credit, DO       [START ON 01/03/2025] Chlorhexidine  Gluconate Cloth 2 % PADS 6 each  6 each Topical Q0600 Dennise Hoes, MD       diltiazem  (CARDIZEM ) tablet 90 mg  90 mg Per Tube Q8H Majeed, Sara, DO   90 mg at 01/02/25 1442   free water  30 mL  30 mL Per Tube Q2H Majeed, Sara, DO   30 mL at 01/02/25 1947   gabapentin  (NEURONTIN ) 250 MG/5ML solution 100 mg  100 mg Per Tube QHS Lupie Credit, DO   100 mg at 01/02/25 0011   latanoprost  (XALATAN ) 0.005 % ophthalmic solution 1 drop  1 drop Both Eyes QHS Majeed, Sara, DO       levothyroxine  (SYNTHROID ) tablet 25 mcg  25 mcg Per Tube Q0600 Lupie Credit, DO   25 mcg at 01/02/25 0550   metoprolol  tartrate (LOPRESSOR ) tablet 50 mg  50 mg Per Tube BID Lupie Credit, DO   50 mg at 01/02/25 9085   midodrine  (PROAMATINE ) tablet 10 mg  10 mg Per Tube TID WC Lupie Credit, DO   10 mg at 01/02/25 1738   oxyCODONE  (Oxy IR/ROXICODONE ) immediate release tablet 5 mg  5 mg Per Tube Q6H PRN Cooper, Josseline P, PA-C       pantoprazole  (PROTONIX ) injection 40 mg  40 mg Intravenous Q24H Majeed, Sara, DO       sertraline  (ZOLOFT ) tablet 50 mg  50 mg Per Tube Daily Majeed, Sara, DO   50 mg at 01/02/25  0914     Objective: Vital signs in last 24 hours: BP (!) 105/56   Pulse 72   Temp 97.8 F (36.6 C) (Axillary)   Resp (!) 23   Ht 6' (1.829 m)   Wt 59 kg   SpO2 99%   BMI 17.64 kg/m   Intake/Output from previous day: 01/15 0701 - 01/16 0700 In: 745 [I.V.:10; Blood:315; NG/GT:120; IV Piggyback:300] Out: -  Intake/Output this shift: Total I/O In: 30 [NG/GT:30] Out: -    Physical Exam Vitals reviewed.  Constitutional:      Comments: Cachectic and ill appearing.  Cardiovascular:     Rate and Rhythm: Normal rate.  Pulmonary:     Effort: Pulmonary effort is normal.  Abdominal:     General: Abdomen is flat.     Palpations: Abdomen is soft. There is no mass.  Genitourinary:    Comments: 71fr foley at the meatus.  Not draining and there is blood at the meatus.  Musculoskeletal:     Right lower leg: No edema.     Left lower leg: No edema.  Skin:    General: Skin is warm and dry.  Neurological:     Comments: Non-verbal  Lab Results:  Results for orders placed or performed during the hospital encounter of 01/01/25 (from the past 24 hours)  Ferritin     Status: Abnormal   Collection Time: 01/01/25  8:19 PM  Result Value Ref Range   Ferritin >7,500 (H) 24 - 336 ng/mL  Iron and TIBC     Status: Abnormal   Collection Time: 01/01/25  8:19 PM  Result Value Ref Range   Iron 83 45 - 182 ug/dL   TIBC 790 (L) 749 - 549 ug/dL   Saturation Ratios 40 (H) 17.9 - 39.5 %   UIBC 125 ug/dL  Vitamin B12     Status: Abnormal   Collection Time: 01/01/25  8:19 PM  Result Value Ref Range   Vitamin B-12 1,605 (H) 180 - 914 pg/mL  CBC     Status: Abnormal   Collection Time: 01/02/25  1:30 AM  Result Value Ref Range   WBC 8.6 4.0 - 10.5 K/uL   RBC 2.83 (L) 4.22 - 5.81 MIL/uL   Hemoglobin 8.8 (L) 13.0 - 17.0 g/dL   HCT 74.7 (L) 60.9 - 47.9 %   MCV 89.0 80.0 - 100.0 fL   MCH 31.1 26.0 - 34.0 pg   MCHC 34.9 30.0 - 36.0 g/dL   RDW 84.0 (H) 88.4 - 84.4 %   Platelets 67 (L) 150 -  400 K/uL   nRBC 0.4 (H) 0.0 - 0.2 %  Renal function panel     Status: Abnormal   Collection Time: 01/02/25  1:30 AM  Result Value Ref Range   Sodium 134 (L) 135 - 145 mmol/L   Potassium 4.0 3.5 - 5.1 mmol/L   Chloride 95 (L) 98 - 111 mmol/L   CO2 28 22 - 32 mmol/L   Glucose, Bld 104 (H) 70 - 99 mg/dL   BUN 40 (H) 8 - 23 mg/dL   Creatinine, Ser 8.30 (H) 0.61 - 1.24 mg/dL   Calcium  9.0 8.9 - 10.3 mg/dL   Phosphorus 2.0 (L) 2.5 - 4.6 mg/dL   Albumin 2.7 (L) 3.5 - 5.0 g/dL   GFR, Estimated 43 (L) >60 mL/min   Anion gap 11 5 - 15  Blood transfusion report - scanned     Status: None ()   Collection Time: 01/02/25  8:29 AM   Narrative   Ordered by an unspecified provider.  Blood transfusion report - scanned     Status: None   Collection Time: 01/02/25  8:29 AM   Narrative   Ordered by an unspecified provider.  Hemoglobin and hematocrit, blood     Status: Abnormal   Collection Time: 01/02/25  9:27 AM  Result Value Ref Range   Hemoglobin 8.6 (L) 13.0 - 17.0 g/dL   HCT 74.4 (L) 60.9 - 47.9 %  Urinalysis, w/ Reflex to Culture (Infection Suspected) -Urine, Catheterized     Status: Abnormal   Collection Time: 01/02/25 12:12 PM  Result Value Ref Range   Specimen Source URINE, CATHETERIZED    Color, Urine RED (A) YELLOW   APPearance TURBID (A) CLEAR   Specific Gravity, Urine 1.020 1.005 - 1.030   pH 8.0 5.0 - 8.0   Glucose, UA (A) NEGATIVE mg/dL    TEST NOT REPORTED DUE TO COLOR INTERFERENCE OF URINE PIGMENT   Hgb urine dipstick (A) NEGATIVE    TEST NOT REPORTED DUE TO COLOR INTERFERENCE OF URINE PIGMENT   Bilirubin Urine (A) NEGATIVE    TEST NOT REPORTED DUE TO COLOR INTERFERENCE OF URINE PIGMENT  Ketones, ur (A) NEGATIVE mg/dL    TEST NOT REPORTED DUE TO COLOR INTERFERENCE OF URINE PIGMENT   Protein, ur (A) NEGATIVE mg/dL    TEST NOT REPORTED DUE TO COLOR INTERFERENCE OF URINE PIGMENT   Nitrite (A) NEGATIVE    TEST NOT REPORTED DUE TO COLOR INTERFERENCE OF URINE PIGMENT    Leukocytes,Ua (A) NEGATIVE    TEST NOT REPORTED DUE TO COLOR INTERFERENCE OF URINE PIGMENT   Squamous Epithelial / HPF 0-5 0 - 5 /HPF   WBC, UA 6-10 0 - 5 WBC/hpf   RBC / HPF >50 0 - 5 RBC/hpf   Bacteria, UA RARE (A) NONE SEEN  Ammonia     Status: Abnormal   Collection Time: 01/02/25 12:28 PM  Result Value Ref Range   Ammonia 42 (H) 9 - 35 umol/L    BMET Recent Labs    01/01/25 1849 01/01/25 1857 01/02/25 0130  NA 137 137 134*  K 3.7 3.6 4.0  CL 98 100 95*  CO2 26  --  28  GLUCOSE 105* 109* 104*  BUN 32* 35* 40*  CREATININE 1.33* 1.40* 1.69*  CALCIUM  8.8*  --  9.0   PT/INR No results for input(s): LABPROT, INR in the last 72 hours. ABG No results for input(s): PHART, HCO3 in the last 72 hours.  Invalid input(s): PCO2, PO2  Studies/Results: CT ABDOMEN PELVIS WO CONTRAST Result Date: 01/02/2025 EXAM: CT ABDOMEN AND PELVIS WITHOUT CONTRAST 01/02/2025 12:07:35 PM TECHNIQUE: CT of the abdomen and pelvis was performed without the administration of intravenous contrast. Multiplanar reformatted images are provided for review. Automated exposure control, iterative reconstruction, and/or weight-based adjustment of the mA/kV was utilized to reduce the radiation dose to as low as reasonably achievable. COMPARISON: 12/08/2024 CLINICAL HISTORY: Lower GI bleed. FINDINGS: LOWER CHEST: Moderate right and small left pleural effusion with passive atelectasis. The catheter terminates in the right atrium. Low density blood pool suggests anemia. LIVER: The liver is unremarkable. GALLBLADDER AND BILE DUCTS: Gallbladder wall thickening versus small amount of pericholecystic fluid. Gallstones noted. No biliary ductal dilatation. SPLEEN: Splenomegaly. Similar appearance of 3 cm lesion inferiorly in the spleen. PANCREAS: No acute abnormality. ADRENAL GLANDS: No acute abnormality. KIDNEYS, URETERS AND BLADDER: Mild right hydronephrosis and moderate right hydroureter. No definite ureteral stone  seen. Ureteral margins indistinct distally. Stable left renal cyst. No further imaging workup is needed for this cyst. Mild to moderate left hydronephrosis and moderate left hydroureter. A Foley catheter is embedded in high density material in the lumen of the urinary bladder which was not present on 12/08/2024 and presumably represents a blood product/hematoma. Small amount of gas noted in the urinary bladder. No stones in the kidneys. No perinephric or periureteral stranding. GI AND BOWEL: Stomach demonstrates no acute abnormality. PEG (Percutaneous Endoscopic Gastrostomy) tube noted. No dilated bowel noted. Possible bowel wall thickening in the lower rectum with continued presacral edema. Cannot exclude wall thickening in the ascending colon and hepatic flexure, although the appearance could be due to peristaltic activity. There is no bowel obstruction. PERITONEUM AND RETROPERITONEUM: Suspected mild to moderate ascites. No free air. Diffuse mesenteric edema noted. VASCULATURE: Systemic atherosclerosis is present, including the aorta and iliac arteries. LYMPH NODES: No lymphadenopathy. REPRODUCTIVE ORGANS: No acute abnormality. BONES AND SOFT TISSUES: 5.1 x 2.3 cm complex fluid collection along the right iliacus muscle on image 64 series 3, previously 6.0 x 3.5 cm on 12/08/2024. This could be hematoma or abscess. Large sacral decubitus ulcer with chronic bony destructive findings along  the S5 segment of the sacrum and sacrococcygeal junction, and a 1.2 cm gap between the lower margin of the sacrum (which may be exposed to the atmosphere) and the remaining coccyx on image 63 series 7. Lower lumbar spondylosis and degenerative disc disease. Mild subcutaneous edema. IMPRESSION: 1. Possible bowel wall thickening in the lower rectum with presacral edema, and cannot exclude wall thickening in the ascending colon and hepatic flexure. 2. Low density blood pool, which can be seen with anemia. 3. Foley catheter embedded in  high-density material within the urinary bladder, presumably blood product or hematoma, new from prior, with a small amount of intravesical gas. 4. Mild right hydronephrosis with moderate right hydroureter and mild to moderate left hydronephrosis with moderate left hydroureter, without a definite ureteral stone. 5. 5.1 x 2.3 cm complex fluid collection along the right iliacus muscle, which could represent hematoma or abscess, decreased in size compared to prior. 6. Large sacral decubitus ulcer with chronic bony destructive change at the S5 segment and sacrococcygeal junction, with a 1.2 cm gap between the lower sacrum and remaining coccyx. 7. Moderate right and small left pleural effusions with passive atelectasis. 8. Gallstones with gallbladder wall thickening versus a small amount of pericholecystic fluid. 9. Suspected mild to moderate ascites with diffuse mesenteric edema and mild subcutaneous edema. 10. Splenomegaly with a similar 3 cm inferior splenic lesion. 11. Systemic atherosclerosis including the aorta and iliac arteries. 12. Lower lumbar spondylosis and degenerative disc disease. Electronically signed by: Ryan Salvage MD 01/02/2025 12:50 PM EST RP Workstation: [id number]   I have reviewed the CT films and report and the pertinent labs and notes.      Assessment/Plan: Gross hematuria with clot retention.   64fr hematuria foley placed and a large volume of clot removed.   Will keep irrigation port plugged and have requested hand irrigation prn.   Will continue to follow.   Mild bilateral hydronephrosis that is likely secondary to bladder wall thickening from chronic bladder outlet obstruction.       No follow-ups on file.    CC: Dr. Rollene Keeling.      Ricardo [name] 01/02/2025     [1] No Known Allergies  "

## 2025-01-02 NOTE — Progress Notes (Deleted)
 PHARMACY NOTE:  ANTIMICROBIAL RENAL DOSAGE ADJUSTMENT  Current antimicrobial regimen includes a mismatch between antimicrobial dosage and estimated renal function.  As per policy approved by the Pharmacy & Therapeutics and Medical Executive Committees, the antimicrobial dosage will be adjusted accordingly.  Current antimicrobial dosage:  meropenem  500 mg IV Q2H   Indication: Osteomyelitis   Renal Function:  Estimated Creatinine Clearance: 33.5 mL/min (A) (by C-G formula based on SCr of 1.69 mg/dL (H)). []      On intermittent HD, scheduled: []      On CRRT    Antimicrobial dosage has been changed to:  Meropenem  1 g IV Q12H   Additional comments:   Thank you for allowing pharmacy to be a part of this patient's care.  Massie Fila, PharmD Clinical Pharmacist  01/02/2025 10:50 AM

## 2025-01-02 NOTE — Consult Note (Addendum)
 "   Consultation  Referring Provider: ERMD/[place] Primary Care Physician:  [facility] [place] Primary Gastroenterologist:  Dr Cindie / [facility]   Reason for Consultation: Bloody stools, anemia  HPI: Ricardo [name] is a 74 y.o. male who was sent to the emergency room yesterday from [facility] here at [facility] after he developed grossly bloody stool during dialysis treatment yesterday.  Patient was apparently noted to have hemoglobin of 5.5 yesterday down from hemoglobin of 7.8 previous and did receive 2 units of packed RBCs during dialysis yesterday. 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  He has history of atrial fibrillation but has not been anticoagulated, acute and chronic respiratory failure. Patient has had a difficult year with multiple hospitalizations.  He was admitted to [facility] in August with UTI septic shock and acute respiratory failure and renal failure.  He apparently did have some GI bleeding during that admission.  He had EGD at the time of PEG placement on 09/16/2024 which was unremarkable.  He was discharged to [facility] on 10/01/2024, found to have a ESBL bacteremia at that time.  He also developed an Enterococcus peritonitis treated with IV antibiotics October 2025.  He has been requiring very frequent transfusions over the past couple of months [facility].  He recently underwent a bedside debridement on 12/25/2024 for his chronic sacral wound with osteomyelitis.  Patient had colonoscopy last in September 2024 Dr. Everlina GI which was done for screening had a 3 mm cecal polyp removed and noted to have nonbleeding internal hemorrhoids, no diverticulosis  noted. Recent scans have not mentioned any diverticulosis.  Again last EGD was done September 16, 2024 at the time of PEG placement and unremarkable.  Most recent CT abdomen and pelvis 12/08/2024 done without contrast showed bilateral small bilateral pleural effusions, gallstones no ductal dilation, benign-appearing cystic lesion in the spleen PEG tube in the stomach, and deep sacral wound posterior to the coccyx concerning for discitis/osteomyelitis.  Patient has been hemodynamically stable overnight in the ER, nursing reports that he did have 2 episodes grossly bloody bowel movements overnight, smaller volume.  Labs today WBC 8.6/hemoglobin 8.8/hematocrit 25.2, this is after 2 units of packed RBCs at dialysis yesterday and then 1 unit of blood on transfer to the ER. Potassium 4 BUN 40/creatinine 1.69   Patient is unable to offer any history, not responding verbally this morning   Past Medical History:  Diagnosis Date   Alcohol abuse    Anemia    Arthritis    Carpal tunnel syndrome    CKD (chronic kidney disease) stage 3, GFR 30-59 ml/min (HCC) 07/03/2024   Gout    History of radiation therapy 04/10/2017-06/04/2017   Site/dose:   The prostatic fossa was treated to 68.4 Gy in 38 fractions of 1.8 Gy   Myelofibrosis (HCC)    Prostate cancer [facility])     Past Surgical History:  Procedure Laterality Date   COLONOSCOPY WITH PROPOFOL  N/A 09/10/2023   Procedure: COLONOSCOPY WITH PROPOFOL ;  Surgeon: Cindie Carlin POUR, DO;  [facility];  Service: Endoscopy;  Laterality: N/A;  11:30 am, asa 3   CYSTECTOMY     left bicept   IR GASTROSTOMY TUBE MOD SED  11/11/2024   IR TUNNELED CENTRAL VENOUS CATH [facility] IM[facility]  10/28/2024   POLYPECTOMY  09/10/2023   Procedure: POLYPECTOMY;  Surgeon: Cindie Carlin POUR, DO;  [facility]: [facility];  Service: Endoscopy;;   PROSTATECTOMY  2014   TENDON REPAIR     right thumb    Prior to Admission medications  Medication Sig Start Date End Date  Taking? Authorizing Provider  acetaminophen  (TYLENOL ) 325 MG tablet Place 650 mg into feeding tube every 6 (six) hours as needed for mild pain (pain score 1-3).   Yes [provider]  ALPRAZolam (XANAX) 0.5 MG tablet Take 0.5 mg by mouth at bedtime as needed.   Yes [provider]  cyanocobalamin  1000 MCG tablet Place 1,000 mcg into feeding tube daily. 07/26/23  Yes [provider]  diltiazem  (CARDIZEM ) 90 MG tablet Place 90 mg into feeding tube 4 (four) times daily.   Yes [provider]  gabapentin  (NEURONTIN ) 100 MG capsule Give 100mg  per tube nightly   Yes [provider]  latanoprost  (XALATAN ) 0.005 % ophthalmic solution Apply 1 drop to eye at bedtime.   Yes [provider]  levothyroxine  (SYNTHROID ) 25 MCG tablet Place 25 mcg into feeding tube daily before breakfast.   Yes [provider]  linezolid  (ZYVOX ) 600 MG tablet Take 600 mg by mouth 2 (two) times daily.   Yes [provider]  melatonin 3 MG TABS tablet Place 3 mg into feeding tube at bedtime as needed (for sleep).   Yes [provider]  Meropenem  (MERREM  IV) Inject 500 mg into the vein daily. In sterile water  IV syringe   Yes [provider]  metoprolol  tartrate (LOPRESSOR ) 50 MG tablet Place 50 mg into feeding tube 2 (two) times daily.   Yes [provider]  midodrine  (PROAMATINE ) 10 MG tablet Place 10 mg into feeding tube 3 (three) times daily.   Yes [provider]  omeprazole 20 MG TBDD disintegrating tablet Give 40mg  per tube daily   Yes [provider]  oxyCODONE  (OXY IR/ROXICODONE ) 5 MG immediate release tablet Place 5 mg into feeding tube every 8 (eight) hours as needed for severe pain (pain score 7-10).   Yes [provider]  polyethylene glycol (MIRALAX  / GLYCOLAX ) 17 g packet Place 17 g into feeding tube daily.   Yes [provider]  senna (SENOKOT) 8.6 MG TABS tablet Place 1 tablet into  feeding tube at bedtime.   Yes [provider]  sertraline  (ZOLOFT ) 50 MG tablet Place 50 mg into feeding tube daily.   Yes [provider]  Sodium Hypochlorite (DAKINS, 1/2 STRENGTH, EX) Apply 1 application  topically daily.   Yes [provider]  traZODone  (DESYREL ) 100 MG tablet Place 100 mg into feeding tube at bedtime.   Yes [provider]  UNABLE TO FIND Inject 100 mcg into the vein every 14 (fourteen) days. Micera   Yes [provider]  dextrose  50 % solution Inject 12.5 g into the vein as needed for low blood sugar. 524mL/hr Patient not taking: Reported on 01/01/2025    [provider]  dextrose  50 % solution Inject 25 g into the vein as needed for low blood sugar. 1079mL/hr Patient not taking: Reported on 01/01/2025    [provider]  glucagon Emergency 1 MG SOLR Inject 1 mg into the muscle as needed (for hyperglycemia). Patient not taking: Reported on 01/01/2025    [provider]  glucose 4 GM chewable tablet Chew 3 tablets by mouth as needed for low blood sugar. Patient not taking:  Reported on 01/01/2025    [provider]  insulin lispro (HUMALOG) 100 UNIT/ML injection Inject 0-12 Units into the skin every 6 (six) hours. Patient not taking: Reported on 01/01/2025    [provider]  sodium chloride  0.9 % infusion 0-400 mLs as needed. During dialysis Patient not taking: Reported on 01/01/2025    [provider]  UNABLE TO FIND Erythropoietin as needed per policy Patient not taking: Reported on 01/01/2025    [provider]    Current Facility-Administered Medications  Medication Dose Route Frequency Provider Last Rate Last Admin   acetaminophen  (TYLENOL ) tablet 1,000 mg  1,000 mg Per Tube Q6H PRN Majeed, Camie, DO       Or   acetaminophen  (TYLENOL ) suppository 650 mg  650 mg Rectal Q6H PRN Majeed, Camie, DO       diltiazem  (CARDIZEM ) tablet 90 mg  90 mg Per Tube Q8H Majeed, Sara,  DO   90 mg at 01/02/25 0550   free water  30 mL  30 mL Per Tube Q2H Majeed, Sara, DO   30 mL at 01/02/25 9078   gabapentin  (NEURONTIN ) 250 MG/5ML solution 100 mg  100 mg Per Tube QHS Lupie Camie, DO   100 mg at 01/02/25 0011   latanoprost  (XALATAN ) 0.005 % ophthalmic solution 1 drop  1 drop Both Eyes QHS Majeed, Sara, DO       levothyroxine  (SYNTHROID ) tablet 25 mcg  25 mcg Per Tube Q0600 Lupie Camie, DO   25 mcg at 01/02/25 0550   linezolid  (ZYVOX ) IVPB 600 mg  600 mg Intravenous Q12H Lupie Camie, DO   Stopped at 01/02/25 0024   meropenem  (MERREM ) 500 mg in sodium chloride  0.9 % 100 mL IVPB  500 mg Intravenous Daily Majeed, Sara, DO 200 mL/hr at 01/02/25 0921 500 mg at 01/02/25 9078   metoprolol  tartrate (LOPRESSOR ) tablet 50 mg  50 mg Per Tube BID Lupie Camie, DO   50 mg at 01/02/25 9085   midodrine  (PROAMATINE ) tablet 10 mg  10 mg Per Tube TID WC Lupie Camie, DO   10 mg at 01/02/25 9085   oxyCODONE  (Oxy IR/ROXICODONE ) immediate release tablet 5 mg  5 mg Per Tube Q6H PRN Lupie Camie, DO       pantoprazole  (PROTONIX ) injection 40 mg  40 mg Intravenous Q24H Majeed, Sara, DO       sertraline  (ZOLOFT ) tablet 50 mg  50 mg Per Tube Daily Majeed, Sara, DO   50 mg at 01/02/25 9085   traZODone  (DESYREL ) tablet 100 mg  100 mg Per Tube QHS Lupie Camie, DO   100 mg at 01/02/25 0011   Current Outpatient Medications  Medication Sig Dispense Refill   acetaminophen  (TYLENOL ) 325 MG tablet Place 650 mg into feeding tube every 6 (six) hours as needed for mild pain (pain score 1-3).     ALPRAZolam (XANAX) 0.5 MG tablet Take 0.5 mg by mouth at bedtime as needed.     cyanocobalamin  1000 MCG tablet Place 1,000 mcg into feeding tube daily.     diltiazem  (CARDIZEM ) 90 MG tablet Place 90 mg into feeding tube 4 (four) times daily.     gabapentin  (NEURONTIN ) 100 MG capsule Give 100mg  per tube nightly     latanoprost  (XALATAN ) 0.005 % ophthalmic solution Apply 1 drop to eye at bedtime.     levothyroxine   (SYNTHROID ) 25 MCG tablet Place 25 mcg into feeding tube daily before breakfast.     linezolid  (ZYVOX ) 600 MG tablet Take 600 mg by  mouth 2 (two) times daily.     melatonin 3 MG TABS tablet Place 3 mg into feeding tube at bedtime as needed (for sleep).     Meropenem  (MERREM  IV) Inject 500 mg into the vein daily. In sterile water  IV syringe     metoprolol  tartrate (LOPRESSOR ) 50 MG tablet Place 50 mg into feeding tube 2 (two) times daily.     midodrine  (PROAMATINE ) 10 MG tablet Place 10 mg into feeding tube 3 (three) times daily.     omeprazole 20 MG TBDD disintegrating tablet Give 40mg  per tube daily     oxyCODONE  (OXY IR/ROXICODONE ) 5 MG immediate release tablet Place 5 mg into feeding tube every 8 (eight) hours as needed for severe pain (pain score 7-10).     polyethylene glycol (MIRALAX  / GLYCOLAX ) 17 g packet Place 17 g into feeding tube daily.     senna (SENOKOT) 8.6 MG TABS tablet Place 1 tablet into feeding tube at bedtime.     sertraline  (ZOLOFT ) 50 MG tablet Place 50 mg into feeding tube daily.     Sodium Hypochlorite (DAKINS, 1/2 STRENGTH, EX) Apply 1 application  topically daily.     traZODone  (DESYREL ) 100 MG tablet Place 100 mg into feeding tube at bedtime.     UNABLE TO FIND Inject 100 mcg into the vein every 14 (fourteen) days. Micera     dextrose  50 % solution Inject 12.5 g into the vein as needed for low blood sugar. 570mL/hr (Patient not taking: Reported on 01/01/2025)     dextrose  50 % solution Inject 25 g into the vein as needed for low blood sugar. 1020mL/hr (Patient not taking: Reported on 01/01/2025)     glucagon Emergency 1 MG SOLR Inject 1 mg into the muscle as needed (for hyperglycemia). (Patient not taking: Reported on 01/01/2025)     glucose 4 GM chewable tablet Chew 3 tablets by mouth as needed for low blood sugar. (Patient not taking: Reported on 01/01/2025)     insulin lispro (HUMALOG) 100 UNIT/ML injection Inject 0-12 Units into the skin every 6 (six) hours. (Patient not  taking: Reported on 01/01/2025)     sodium chloride  0.9 % infusion 0-400 mLs as needed. During dialysis (Patient not taking: Reported on 01/01/2025)     UNABLE TO FIND Erythropoietin as needed per policy (Patient not taking: Reported on 01/01/2025)      Allergies as of 01/01/2025   (No Known Allergies)    Family History  Problem Relation Age of Onset   Heart disease Mother    Hypertension Sister    Cancer Maternal Aunt        breast   Cancer Maternal Uncle        colon   Cancer Maternal Uncle        lung    Social History   Socioeconomic History   Marital status: Divorced    Spouse name: Not on file   Number of children: Not on file   Years of education: Not on file   Highest education level: Not on file  Occupational History   Not on file  Tobacco Use   Smoking status: Never    Passive exposure: Never   Smokeless tobacco: Current    Types: Snuff  Vaping Use   Vaping status: Never Used  Substance and Sexual Activity   Alcohol use: Not Currently    Comment: last has ETOH 7 years ago as of 09/07/2023   Drug use: No   Sexual activity: Not  Currently  Other Topics Concern   Not on file  Social History Narrative   Not on file   Social Drivers of Health   Tobacco Use: High Risk (12/19/2024)   Patient History    Smoking Tobacco Use: Never    Smokeless Tobacco Use: Current    Passive Exposure: Never  Financial Resource Strain: Low Risk  (10/02/2024)   Received from [facility]   Overall Financial Resource Strain (CARDIA)    Difficulty of Paying Living Expenses: Not hard at all  Food Insecurity: No Food Insecurity (10/02/2024)   Received from [facility]   Epic    Within the past 12 months, you worried that your food would run out before you got the money to buy more.: Never true    Within the past 12 months, the food you bought just didn't last and you didn't have money to get more.: Never true  Transportation Needs: No Transportation Needs (10/01/2024)    Received from [facility]   [facility] SDOH Transportation Source    Has lack of transportation kept you from medical appointments or from getting medications?: No    Has lack of transportation kept you from meetings, work, or from getting things needed for daily living?: No  Physical Activity: Not on file  Stress: No Stress Concern Present (10/01/2024)   Received from [facility]   [employer] of Occupational Health - Occupational Stress Questionnaire    Feeling of Stress : Not at all  Recent Concern: Stress - Stress Concern Present (08/03/2024)   Received from [facility] of Occupational Health - Occupational Stress Questionnaire    Do you feel stress - tense, restless, nervous, or anxious, or unable to sleep at night because your mind is troubled all the time - these days?: To some extent  Social Connections: Socially Isolated (10/02/2024)   Received from [facility]   Social Connection and Isolation Panel    In a typical week, how many times do you talk on the phone with family, friends, or neighbors?: Three times a week    How often do you get together with friends or relatives?: Three times a week    How often do you attend church or religious services?: Never    Do you belong to any clubs or organizations such as church groups, unions, fraternal or athletic groups, or school groups?: No    How often do you attend meetings of the clubs or organizations you belong to?: Never    Are you married, widowed, divorced, separated, never married, or living with a partner?: Widowed  Intimate Partner Violence: Not At Risk (10/01/2024)   Received from [facility]   Domestic Abuse Assessment    Do you feel safe in your relationships at home?: Yes    Physical Abuse: Denies    HRSN Domestic Abuse - Type of Abuse: Not on file    HRSN Domestic Abuse - Time Frame: Not on file    HRSN Domestic Abuse - Signs and Symptoms: Not on file    Verbal Abuse: Denies    HRSN  Domestic Abuse - Reported To: Not on file  Depression (EYV7-0): Not on file  Alcohol Screen: Not on file  Housing: Low Risk  (10/02/2024)   Received from [facility]   Epic    In the last 12 months, was there a time when you were not able to pay the mortgage or rent on time?: No    In the past  12 months, how many times have you moved where you were living?: 0    At any time in the past 12 months, were you homeless or living in a shelter (including now)?: No  Utilities: Not At Risk (10/02/2024)   Received from [facility]   [facility] Utilities    Threatened with loss of utilities: No  Recent Concern: Utilities - At Risk (08/03/2024)   Received from [facility]    In the past 12 months has the electric, gas, oil, or water  company threatened to shut off services in your home?: Yes  Health Literacy: Not on file    Review of Systems: Patient unable to offer  Physical Exam: Vital signs in last 24 hours: Temp:  [97.8 F (36.6 C)-98.6 F (37 C)] 98.6 F (37 C) (01/16 0649) Pulse Rate:  [86-106] 101 (01/16 0745) Resp:  [17-37] 20 (01/16 0745) BP: (110-141)/(42-97) 130/67 (01/16 0745) SpO2:  [95 %-100 %] 99 % (01/16 0745) Weight:  [59 kg] 59 kg (01/15 1821)   General:   Alert,  Well-developed, thin acute and chronically ill-appearing older African-American male , eyes open, but no response to verbal Head:  Normocephalic and atraumatic. Eyes:  Sclera clear, no icterus.   Conjunctiva pink. Ears:  Normal auditory acuity. Nose:  No deformity, discharge,  or lesions. Mouth:  No deformity or lesions.   Neck:  Supple; no masses or thyromegaly. Lungs:  Clear throughout to auscultation.   No wheezes, crackles, or rhonchi.  Heart:  Regular rate and rhythm; no murmurs, clicks, rubs,  or gallops. Abdomen:  Soft,nontender, BS active,nonpalp mass or hsm.  PEG tube in place, aspirated and no blood in stomach Rectal:  not done Msk:  Symmetrical without gross deformities. . Pulses:   Normal pulses noted. Extremities:  Without clubbing or edema. Neurologic:  Alert , no response to verbal stimuli this morning Skin:  Intact without significant lesions or rashes.. Psych:  Alert   Intake/Output from previous day: 01/15 0701 - 01/16 0700 In: 745 [I.V.:10; Blood:315; NG/GT:120; IV Piggyback:300] Out: -  Intake/Output this shift: Total I/O In: 30 [NG/GT:30] Out: -   Lab Results: Recent Labs    01/01/25 0405 01/01/25 1849 01/01/25 1857 01/02/25 0130  WBC 7.1 10.7*  --  8.6  HGB 5.5* 8.0* 7.1* 8.8*  HCT 16.6* 24.3* 21.0* 25.2*  PLT 73* 74*  --  67*   BMET Recent Labs    01/01/25 0405 01/01/25 1849 01/01/25 1857 01/02/25 0130  NA 134* 137 137 134*  K 4.8 3.7 3.6 4.0  CL 95* 98 100 95*  CO2 26 26  --  28  GLUCOSE 109* 105* 109* 104*  BUN 88* 32* 35* 40*  CREATININE 2.79* 1.33* 1.40* 1.69*  CALCIUM  9.4 8.8*  --  9.0   LFT Recent Labs    01/01/25 1849 01/02/25 0130  PROT 7.4  --   ALBUMIN 2.5* 2.7*  AST 54*  --   ALT 32  --   ALKPHOS 327*  --   BILITOT 0.5  --    PT/INR No results for input(s): LABPROT, INR in the last 72 hours. Hepatitis Panel No results for input(s): HEPBSAG, HCVAB, HEPAIGM, HEPBIGM in the last 72 hours.   IMPRESSION:  #38 74 year old African-American male with multiple serious comorbidities currently transferred from [facility] where he has been a patient over the past 3 months after prolonged hospitalization with sepsis.  Course they are complicated by osteomyelitis with large sacral wound, Enterococcus  peritonitis and encephalopathy. He is PEG tube dependent End-stage renal disease on hemodialysis Carries diagnosis of JAK2 positive myelofibrosis and has been transfusion dependent receiving numerous transfusions over the past couple of months.  #2 acute lower GI bleed onset yesterday at dialysis with maroon/grossly bloody stool  Patient had 2 smaller episodes overnight, none this  morning Hemoglobin 8.8 after total of 3 units of packed RBCs in the past 24 hours.  Aspirated G-tube this morning, clear effluent no evidence of blood in the stomach  I suspect his bleeding may have been secondary to ischemic colitis in setting of his chronic hypotension requiring midodrine , and dialysis yesterday  #2 atrial fibrillation/not anticoagulated #3 COPD/chronic respiratory failure #4 history of prostate cancer status post prior radiation  PLAN: Will proceed with CT abdomen and pelvis no IV contrast today, okay for oral contrast via G-tube Not planning any endoscopic intervention, patient hopefully can be managed supportively.  He is a poor candidate for sedation and high risk for complications.  He is currently DNR/DNI Palliative care has been consulted which is appropriate as it appears that he has had progressive decline over the past several months.  Trend hemoglobin every 8-12 hours and transfuse as indicated Will wait for CT results, hopefully will be okay to resume tube feedings later in the day    Amy EsterwoodPA-C  01/02/2025, 9:27 AM     Attending physician's note   I have reviewed the chart and examined the patient. Pt nonverbal. I performed a substantive portion of this encounter, including complete performance of at least one of the key components, in conjunction with the APP. I agree with the Advanced Practitioner's note, impression and recommendations.   74yr old nonverbal very unfortunate patient from [facility] with prolonged hospitalization due to sepsis/large sacral decubitus with osteomyelitis, Enterococcus peritonitis, ESRD on HD, encephalopathy, COPD with chr respiratory failure who is PEG dependent. GI consulted for hematochezia with drop in Hb from 8 to 5.5 s/p 3U to 8.8. CTAP without contrast (radiology wanted to hold off on contrast d/t recent start of dialysis within 6 months) was negative except for mild bowel wall thickening.  No  further bleeding.  Most recent colonoscopy 08/2023 was negative except for internal hemorrhoids. No div. Most recent EGD at the time of PEG was negative PEG aspirate today-was negative for blood  Patient also has myelofibrosis with positive JAK2 and is transfusion dependent with multiple times when hemoglobin goes down to 6 over the last 4-5 years.  Plan: - Suggest conservative management.  Avoid any endoscopic procedures as risks significantly outweigh the benefits. - Suggest palliative care consult. - Agree with DNR/DNI. ?Heading for hospice. - Will sign off for now.  Please call if GI could be of any further help.   Anselm Bring, MD [facility] [phone]  "

## 2025-01-02 NOTE — Unmapped External Note (Signed)
 Case Management Reassessment Note  CM REASSESSMENT  Expected Discharge Date: 01/09/2025   Narrative  PATIENT H-1 DISCHARGE  KIMBLE IHA R., RT 01/02/2025

## 2025-01-02 NOTE — Care Plan (Signed)
 Spoke with patient's daughter [name] who confirmed patient's name and DOB.  Clarified several things during our call today: - patient and family desire DNR status. - he has not followed with Palliative Care in the outpatient setting; daughter would appreciate inpatient Palliative consult. - confirmed patient did complete full HD session yesterday 1/15 on his usual TTS schedule.  As such, palliative consult placed and patient's code status changed to DNR. Will reach out to Nephrology to schedule appropriate HD while hospitalized.  Lauraine Norse, DO [facility], PGY-2 01/02/25 8:53 AM

## 2025-01-02 NOTE — ED Notes (Signed)
 Pt had bloody bowel movements. Stool was maroon in color, loose consistency, unformed. Pt turned, cleaned, and new chucks placed.

## 2025-01-02 NOTE — ED Notes (Signed)
 MD Maragaret notified of small blood noted in patients catheter and coming from around the catheter at the tip of the penis . This RN is unknown when catheter was placed and catheter appears to be [facility] by [facility]. There is approximately 100 mL noted of urine mixed with blood in drainage bag and clots formed in catheter tubing.

## 2025-01-02 NOTE — Assessment & Plan Note (Addendum)
 T/Th/Sat HD schedule. Last iHD was on 01/01/25 - Nephrology consult, appreciate recommendation - AM RFP, Mg  - Avoid neurotoxic medications - Strict I&O

## 2025-01-03 DIAGNOSIS — D649 Anemia, unspecified: Secondary | ICD-10-CM | POA: Diagnosis not present

## 2025-01-03 DIAGNOSIS — K922 Gastrointestinal hemorrhage, unspecified: Secondary | ICD-10-CM | POA: Diagnosis not present

## 2025-01-03 LAB — CBC
HCT: 24.8 % — ABNORMAL LOW (ref 39.0–52.0)
Hemoglobin: 8.4 g/dL — ABNORMAL LOW (ref 13.0–17.0)
MCH: 30.7 pg (ref 26.0–34.0)
MCHC: 33.9 g/dL (ref 30.0–36.0)
MCV: 90.5 fL (ref 80.0–100.0)
Platelets: 71 K/uL — ABNORMAL LOW (ref 150–400)
RBC: 2.74 MIL/uL — ABNORMAL LOW (ref 4.22–5.81)
RDW: 16.2 % — ABNORMAL HIGH (ref 11.5–15.5)
WBC: 8.4 K/uL (ref 4.0–10.5)
nRBC: 0.7 % — ABNORMAL HIGH (ref 0.0–0.2)

## 2025-01-03 LAB — RENAL FUNCTION PANEL
Albumin: 2.7 g/dL — ABNORMAL LOW (ref 3.5–5.0)
Anion gap: 14 (ref 5–15)
BUN: 60 mg/dL — ABNORMAL HIGH (ref 8–23)
CO2: 24 mmol/L (ref 22–32)
Calcium: 9.4 mg/dL (ref 8.9–10.3)
Chloride: 95 mmol/L — ABNORMAL LOW (ref 98–111)
Creatinine, Ser: 2.65 mg/dL — ABNORMAL HIGH (ref 0.61–1.24)
GFR, Estimated: 25 mL/min — ABNORMAL LOW
Glucose, Bld: 83 mg/dL (ref 70–99)
Phosphorus: 4.9 mg/dL — ABNORMAL HIGH (ref 2.5–4.6)
Potassium: 4.5 mmol/L (ref 3.5–5.1)
Sodium: 133 mmol/L — ABNORMAL LOW (ref 135–145)

## 2025-01-03 LAB — PROTIME-INR
INR: 1.8 — ABNORMAL HIGH (ref 0.8–1.2)
Prothrombin Time: 21.4 s — ABNORMAL HIGH (ref 11.4–15.2)

## 2025-01-03 LAB — HEPATITIS B SURFACE ANTIBODY, QUANTITATIVE: Hep B S AB Quant (Post): <3.5 m[IU]/mL — ABNORMAL LOW

## 2025-01-03 LAB — FIBRINOGEN: Fibrinogen: 228 mg/dL (ref 210–475)

## 2025-01-03 LAB — PHOSPHORUS: Phosphorus: 4.9 mg/dL — ABNORMAL HIGH (ref 2.5–4.6)

## 2025-01-03 LAB — MRSA NEXT GEN BY PCR, NASAL: MRSA by PCR Next Gen: NOT DETECTED

## 2025-01-03 LAB — HEMOGLOBIN AND HEMATOCRIT, BLOOD
HCT: 24.6 % — ABNORMAL LOW (ref 39.0–52.0)
HCT: 22.8 % — ABNORMAL LOW (ref 39.0–52.0)
Hemoglobin: 8.4 g/dL — ABNORMAL LOW (ref 13.0–17.0)
Hemoglobin: 7.6 g/dL — ABNORMAL LOW (ref 13.0–17.0)

## 2025-01-03 LAB — MAGNESIUM: Magnesium: 2.3 mg/dL (ref 1.7–2.4)

## 2025-01-03 LAB — C-REACTIVE PROTEIN: CRP: 20.9 mg/dL — ABNORMAL HIGH

## 2025-01-03 LAB — GLUCOSE, CAPILLARY: Glucose-Capillary: 99 mg/dL (ref 70–99)

## 2025-01-03 MED ORDER — HEPARIN SODIUM (PORCINE) 1000 UNIT/ML DIALYSIS
1000.0000 [IU] | INTRAMUSCULAR | Status: DC | PRN
  Administered 2025-01-03: 1000 [IU]

## 2025-01-03 MED ORDER — PROSOURCE TF20 ENFIT COMPATIBL EN LIQD
60.0000 mL | Freq: Two times a day (BID) | ENTERAL | Status: DC
  Administered 2025-01-03 – 2025-01-10 (×15): 60 mL
  Filled 2025-01-03 (×15): qty 60

## 2025-01-03 MED ORDER — JUVEN PO PACK
1.0000 | PACK | Freq: Two times a day (BID) | ORAL | Status: DC
  Administered 2025-01-03 – 2025-01-10 (×12): 1
  Filled 2025-01-03 (×12): qty 1

## 2025-01-03 MED ORDER — ORAL CARE MOUTH RINSE: 15.0000 mL | OROMUCOSAL | Status: DC | PRN

## 2025-01-03 MED ORDER — JEVITY 1.5 CAL/FIBER PO LIQD
1000.0000 mL | ORAL | Status: DC
  Administered 2025-01-03 – 2025-01-11 (×9): 1000 mL
  Filled 2025-01-03 (×16): qty 1000

## 2025-01-03 MED ORDER — RENA-VITE PO TABS
1.0000 | ORAL_TABLET | Freq: Every day | ORAL | Status: DC
  Administered 2025-01-03 – 2025-01-10 (×8): 1
  Filled 2025-01-03 (×8): qty 1

## 2025-01-03 MED ORDER — ORAL CARE MOUTH RINSE
15.0000 mL | OROMUCOSAL | Status: DC
  Administered 2025-01-03 – 2025-01-10 (×29): 15 mL via OROMUCOSAL

## 2025-01-03 MED ORDER — HEPARIN SODIUM (PORCINE) 1000 UNIT/ML IJ SOLN
INTRAMUSCULAR | Status: AC
  Filled 2025-01-03: qty 4

## 2025-01-03 NOTE — Assessment & Plan Note (Signed)
 Extensive stage4+ pressure ulcer w/ osteomyelitis at sacral on admission.  He has been on IV Meropenam and Lizolid which ended date initially plan for 01/30/25 for the total of 6 week course.  - General Surgery consult and S/O  - Advised against debridement to create larger wound which unlikely to heal  - Infection Disease  - Dr. Fleeta Rothman, ID was contacted on Sat 01/02/25. He agreed with STOP IV meropenum 500mg  and IV linezolid  600mg  d/t thrombocytopenia as pt had more than 2 weeks of the medication considering Chronic pressure ulcer/Osteomyelitis - Q 2 Turning while in bed and Q15 min while in chair - Wound care consult and follow, appreciate recommendation - Replaced mattress  - PT/OT consult

## 2025-01-03 NOTE — Assessment & Plan Note (Signed)
 Not currently on anticoagulation d/t his high risk of bleeding - Continue home diltiazem  90mg  and metoprolol  50mg  BID once CTAP negative

## 2025-01-03 NOTE — Assessment & Plan Note (Signed)
 H/o metabolic encephalopathy and insomnia: continue home trazodone  100mg  per tube at bedtime and home sertraline  50mg  per tube daily  Hypotension: continue home midodrine  10mg  per tube TID Hypothyroidism: continue home levothyroxine  per tube daily Neuropathy: continue gabapentin  100mg  per tube QHS

## 2025-01-03 NOTE — Assessment & Plan Note (Signed)
 Received 2u pRBC, 1u pRBC in ED. Last transfusion was on 01/02/25.  H/H 8.4/24.8 this AM. CTAP no evidence of GI acute abnormality  - GI consult, appreciate recommendation   - Suspected bleeding from ischemic colitis   - No EGD plan as pt is a poor candidate for sedation  - Trend H/H Daily w/ AM CBC - Heme Onc   - Reached out to on-call Heme Onc Dr. Cloretta on Sat 01/03/25 who recommended contacting his Heme Onc at the [facility] on Monday for additional rec.  - Recommending transfuse platelets for less than 20 or uncontrolled bleeding, - Continue TF through PEG per RD recommendation  - Continue IV Protonix  40 mg Daily  - Pain control: Tylenol , Oxycodone  - Palliative consult and follow, appreciate recommendation  - PT/OT evaluation  - Fall precautions initiated - Delirium precautions initiated - AM RFP, CBC, LFT

## 2025-01-03 NOTE — Progress Notes (Signed)
 "   Subjective: HE had of UOP recorded overnight.  The urine in the foley tubing is light pink.  I did find documentation of his prostate cancer therapy from a [facility] note below.  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    From the [facility] records. Favorable intermediate risk prostate cancer - Dx 03/2016 at [facility] via prostate bx - Stage IIA ([id number]) Gleason grade 3+4, intermediate risk. - s/p radical prostatectomy 09/2016 and XRT 03/2017 for positive margin  - Last PSA 12/2023 0.04   ROS:  Review of Systems  Unable to perform ROS: Mental acuity    Anti-infectives: Anti-infectives (From admission, onward)    Start     Dose/Rate Route Frequency Ordered Stop   01/03/25 1000  meropenem  (MERREM ) 500 mg in sodium chloride  0.9 % 100 mL IVPB  Status:  Discontinued        500 mg 200 mL/hr over 30 Minutes Intravenous Every 24 hours 01/02/25 1128 01/02/25 1136   01/02/25 2200  meropenem  (MERREM ) 1 g in sodium chloride  0.9 % 100 mL IVPB  Status:  Discontinued        1 g 200 mL/hr over 30 Minutes Intravenous Every 12 hours 01/02/25 1049 01/02/25 1128   01/02/25 1000  meropenem  (MERREM ) 500 mg in sodium chloride  0.9 % 100 mL IVPB  Status:  Discontinued        500 mg 200 mL/hr over 30 Minutes Intravenous Daily 01/01/25 2214 01/02/25 1049   01/01/25 2215  meropenem  (MERREM ) 500 mg in sodium chloride  0.9 % 100 mL IVPB  Status:  Discontinued        500 mg 200 mL/hr over 30 Minutes Intravenous Daily 01/01/25 2153 01/01/25 2203   01/01/25 2200  linezolid  (ZYVOX ) IVPB 600 mg  Status:  Discontinued        600 mg 300 mL/hr over 60 Minutes Intravenous Every 12 hours 01/01/25 2153 01/02/25 1136       Current Facility-Administered Medications  Medication Dose Route Frequency Provider Last Rate Last Admin   acetaminophen  (TYLENOL ) tablet 1,000 mg  1,000 mg Per Tube Q6H PRN Lupie Credit, DO   1,000 mg at 01/03/25 0406   Or   acetaminophen  (TYLENOL ) suppository 650 mg   650 mg Rectal Q6H PRN Lupie Credit, DO       Chlorhexidine  Gluconate Cloth 2 % PADS 6 each  6 each Topical Q0600 Dennise Hoes, MD   6 each at 01/03/25 0600   diltiazem  (CARDIZEM ) tablet 90 mg  90 mg Per Tube Q8H Majeed, Sara, DO   90 mg at 01/03/25 9376   feeding supplement (JEVITY 1.5 CAL/FIBER) liquid 1,000 mL  1,000 mL Per Tube Continuous Pray, Margaret E, MD 65 mL/hr at 01/03/25 0940 1,000 mL at 01/03/25 0940   feeding supplement (PROSource TF20) liquid 60 mL  60 mL Per Tube BID Donzetta Rollene BRAVO, MD   60 mL at 01/03/25 0939   free water  30 mL  30 mL Per Tube Q2H Majeed, Sara, DO   30 mL at 01/03/25 1009   gabapentin  (NEURONTIN ) 250 MG/5ML solution 100 mg  100 mg Per Tube QHS Lupie Credit, DO   100 mg at 01/02/25 2234   latanoprost  (XALATAN ) 0.005 % ophthalmic solution 1 drop  1 drop Both Eyes QHS Majeed, Sara, DO   1 drop at 01/02/25 2229   levothyroxine  (SYNTHROID ) tablet 25 mcg  25 mcg Per Tube Q0600 Lupie Credit, DO   25 mcg at 01/03/25 [phone]  metoprolol  tartrate (LOPRESSOR ) tablet 50 mg  50 mg Per Tube BID Lupie Credit, DO   50 mg at 01/02/25 2228   midodrine  (PROAMATINE ) tablet 10 mg  10 mg Per Tube TID WC Majeed, Sara, DO   10 mg at 01/03/25 0744   multivitamin (RENA-VIT) tablet 1 tablet  1 tablet Per Tube QHS Donzetta Rollene BRAVO, MD       nutrition supplement (JUVEN) (JUVEN) powder packet 1 packet  1 packet Per Tube BID BM Donzetta Rollene BRAVO, MD   1 packet at 01/03/25 9060   Oral care mouth rinse  15 mL Mouth Rinse 4 times per day Donzetta Rollene BRAVO, MD   15 mL at 01/03/25 0800   Oral care mouth rinse  15 mL Mouth Rinse PRN Donzetta Rollene BRAVO, MD       oxyCODONE  (Oxy IR/ROXICODONE ) immediate release tablet 5 mg  5 mg Per Tube Q6H PRN Wonda, Josseline P, PA-C   5 mg at 01/03/25 9377   pantoprazole  (PROTONIX ) injection 40 mg  40 mg Intravenous Q24H Lupie Credit, DO   40 mg at 01/02/25 2256   sertraline  (ZOLOFT ) tablet 50 mg  50 mg Per Tube Daily Lupie Credit, DO   50 mg at 01/03/25 0743      Objective: Vital signs in last 24 hours: Temp:  [98.3 F (36.8 C)-98.9 F (37.2 C)] 98.8 F (37.1 C) (01/17 0800) Pulse Rate:  [72-87] 73 (01/17 0800) Resp:  [15-23] 15 (01/17 0353) BP: (105-136)/(51-64) 106/51 (01/17 0800) SpO2:  [93 %-99 %] 93 % (01/17 0800) Weight:  [59.2 kg] 59.2 kg (01/17 0453)  Intake/Output from previous day: 01/16 0701 - 01/17 0700 In: 430 [NG/GT:330] Out: 200 [Urine:200] Intake/Output this shift: Total I/O In: 60 [NG/GT:60] Out: -    Physical Exam Vitals reviewed.  Constitutional:      Appearance: He is ill-appearing.  Genitourinary:    Comments: Some blood on the tubing walls but urine in tubing is fairly clear.     Lab Results:  Recent Labs    01/02/25 0130 01/02/25 0927 01/02/25 2120 01/03/25 0518  WBC 8.6  --   --  8.4  HGB 8.8*   < > 8.8* 8.4*  8.4*  HCT 25.2*   < > 25.6* 24.8*  24.6*  PLT 67*  --   --  71*   < > = values in this interval not displayed.   BMET Recent Labs    01/02/25 0130 01/03/25 0519  NA 134* 133*  K 4.0 4.5  CL 95* 95*  CO2 28 24  GLUCOSE 104* 83  BUN 40* 60*  CREATININE 1.69* 2.65*  CALCIUM  9.0 9.4   PT/INR Recent Labs    01/03/25 0518  LABPROT 21.4*  INR 1.8*   ABG No results for input(s): PHART, HCO3 in the last 72 hours.  Invalid input(s): PCO2, PO2  Studies/Results: CT ABDOMEN PELVIS WO CONTRAST Result Date: 01/02/2025 EXAM: CT ABDOMEN AND PELVIS WITHOUT CONTRAST 01/02/2025 12:07:35 PM TECHNIQUE: CT of the abdomen and pelvis was performed without the administration of intravenous contrast. Multiplanar reformatted images are provided for review. Automated exposure control, iterative reconstruction, and/or weight-based adjustment of the mA/kV was utilized to reduce the radiation dose to as low as reasonably achievable. COMPARISON: 12/08/2024 CLINICAL HISTORY: Lower GI bleed. FINDINGS: LOWER CHEST: Moderate right and small left pleural effusion with passive atelectasis. The  catheter terminates in the right atrium. Low density blood pool suggests anemia. LIVER: The liver is unremarkable. GALLBLADDER AND BILE DUCTS:  Gallbladder wall thickening versus small amount of pericholecystic fluid. Gallstones noted. No biliary ductal dilatation. SPLEEN: Splenomegaly. Similar appearance of 3 cm lesion inferiorly in the spleen. PANCREAS: No acute abnormality. ADRENAL GLANDS: No acute abnormality. KIDNEYS, URETERS AND BLADDER: Mild right hydronephrosis and moderate right hydroureter. No definite ureteral stone seen. Ureteral margins indistinct distally. Stable left renal cyst. No further imaging workup is needed for this cyst. Mild to moderate left hydronephrosis and moderate left hydroureter. A Foley catheter is embedded in high density material in the lumen of the urinary bladder which was not present on 12/08/2024 and presumably represents a blood product/hematoma. Small amount of gas noted in the urinary bladder. No stones in the kidneys. No perinephric or periureteral stranding. GI AND BOWEL: Stomach demonstrates no acute abnormality. PEG (Percutaneous Endoscopic Gastrostomy) tube noted. No dilated bowel noted. Possible bowel wall thickening in the lower rectum with continued presacral edema. Cannot exclude wall thickening in the ascending colon and hepatic flexure, although the appearance could be due to peristaltic activity. There is no bowel obstruction. PERITONEUM AND RETROPERITONEUM: Suspected mild to moderate ascites. No free air. Diffuse mesenteric edema noted. VASCULATURE: Systemic atherosclerosis is present, including the aorta and iliac arteries. LYMPH NODES: No lymphadenopathy. REPRODUCTIVE ORGANS: No acute abnormality. BONES AND SOFT TISSUES: 5.1 x 2.3 cm complex fluid collection along the right iliacus muscle on image 64 series 3, previously 6.0 x 3.5 cm on 12/08/2024. This could be hematoma or abscess. Large sacral decubitus ulcer with chronic bony destructive findings along the S5  segment of the sacrum and sacrococcygeal junction, and a 1.2 cm gap between the lower margin of the sacrum (which may be exposed to the atmosphere) and the remaining coccyx on image 63 series 7. Lower lumbar spondylosis and degenerative disc disease. Mild subcutaneous edema. IMPRESSION: 1. Possible bowel wall thickening in the lower rectum with presacral edema, and cannot exclude wall thickening in the ascending colon and hepatic flexure. 2. Low density blood pool, which can be seen with anemia. 3. Foley catheter embedded in high-density material within the urinary bladder, presumably blood product or hematoma, new from prior, with a small amount of intravesical gas. 4. Mild right hydronephrosis with moderate right hydroureter and mild to moderate left hydronephrosis with moderate left hydroureter, without a definite ureteral stone. 5. 5.1 x 2.3 cm complex fluid collection along the right iliacus muscle, which could represent hematoma or abscess, decreased in size compared to prior. 6. Large sacral decubitus ulcer with chronic bony destructive change at the S5 segment and sacrococcygeal junction, with a 1.2 cm gap between the lower sacrum and remaining coccyx. 7. Moderate right and small left pleural effusions with passive atelectasis. 8. Gallstones with gallbladder wall thickening versus a small amount of pericholecystic fluid. 9. Suspected mild to moderate ascites with diffuse mesenteric edema and mild subcutaneous edema. 10. Splenomegaly with a similar 3 cm inferior splenic lesion. 11. Systemic atherosclerosis including the aorta and iliac arteries. 12. Lower lumbar spondylosis and degenerative disc disease. Electronically signed by: Ryan Salvage MD 01/02/2025 12:50 PM EST RP Workstation: [id number]   Labs reviewed.     Assessment and Plan: Gross hematuria with a history of UTI's and prostate cancer with prior prostatectomy and salvage radiation therapy.   I have recommended further catheter irrigation  and we could consider a voiding trial tomorrow as he does have some urine output.        LOS: 2 days    Skylynn Burkley 01/03/2025 "

## 2025-01-03 NOTE — Progress Notes (Signed)
" °   01/03/25 1758  Vitals  Temp 98.7 F (37.1 C)  Pulse Rate 98  Resp (!) 28  BP (!) 127/54  SpO2 100 %  O2 Device Room Air  Type of Weight Post-Dialysis  Oxygen Therapy  Patient Activity (if Appropriate) In bed  Post Treatment  Dialyzer Clearance Lightly streaked  Liters Processed 76  Fluid Removed (mL) 1000 mL  Tolerated HD Treatment Yes   Received patient in bed to unit.  Alert and oriented.  Informed consent signed and in chart.   TX duration: 3  Patient tolerated well.  Transported back to the room  Alert, without acute distress.  Hand-off given to patient's nurse.   Access used: Yes Access issues: No  Total UF removed: 1000 Medication(s) given: See MAR Post HD VS: See Above Grid Post HD weight: Unable to get weight   Ricardo [name] [facility] "

## 2025-01-03 NOTE — Progress Notes (Signed)
 " [facility] Progress Note    Assessment/ Plan:   Dialysis dependent AKI  -has been on renal replacement therapy since August 2025 (since [facility] to [facility]). Has been doing HD TTS at [facility] -may be deemed as ESRD, let's watch for any semblence of renal recovery in the interim before deeming as ESRD therefore please monitor strict I/O & daily labs. So far, no evidence of renal recovery -HD on TTS schedule, today   Anemia of Chronic Kidney Disease ABLA, GI Bleed Transfusion dependent myelofibrosis -transfuse prn for hgb <7 -GI following--conservative mgmt for now   Hematuria -intermittent issue, h/o prostate Ca per chart -s/p irrigation with urology 1/16   Chronic osteomyelitis -on meropenem  and linezolid   until 01/31/24 -per primary   Severe protein-calorie malnutirion -with PEG -per primary   Secondary hyperparathyroidism -monitor phos   GOC -palliative following  Discussed with daughter at the bedside  Subjective:   Patient seen in room. Daughter at bedside. S/p foley irrigation, passed retained clot per assistance of urology Per daughter, it burns when he urinates and passes bowels.   Objective:   BP 118/63 (BP Location: Left Arm)   Pulse 87   Temp 98.3 F (36.8 C)   Resp 15   Ht 6' (1.829 m)   Wt 59.2 kg   SpO2 95%   BMI 17.70 kg/m   Intake/Output Summary (Last 24 hours) at 01/03/2025 1058 Last data filed at 01/03/2025 1009 Gross per 24 hour  Intake 430 ml  Output 200 ml  Net 230 ml   Weight change: 0.2 kg  Physical Exam: Gen: chronically ill appearing, cachectic CVS: RRR Resp: normal wob Abd: soft, nt/nd Ext: no edema Neuro: awake, in pain Dialysis access: [facility]  Imaging: CT ABDOMEN PELVIS WO CONTRAST Result Date: 01/02/2025 EXAM: CT ABDOMEN AND PELVIS WITHOUT CONTRAST 01/02/2025 12:07:35 PM TECHNIQUE: CT of the abdomen and pelvis was performed without the administration of intravenous contrast. Multiplanar reformatted images are  provided for review. Automated exposure control, iterative reconstruction, and/or weight-based adjustment of the mA/kV was utilized to reduce the radiation dose to as low as reasonably achievable. COMPARISON: 12/08/2024 CLINICAL HISTORY: Lower GI bleed. FINDINGS: LOWER CHEST: Moderate right and small left pleural effusion with passive atelectasis. The catheter terminates in the right atrium. Low density blood pool suggests anemia. LIVER: The liver is unremarkable. GALLBLADDER AND BILE DUCTS: Gallbladder wall thickening versus small amount of pericholecystic fluid. Gallstones noted. No biliary ductal dilatation. SPLEEN: Splenomegaly. Similar appearance of 3 cm lesion inferiorly in the spleen. PANCREAS: No acute abnormality. ADRENAL GLANDS: No acute abnormality. KIDNEYS, URETERS AND BLADDER: Mild right hydronephrosis and moderate right hydroureter. No definite ureteral stone seen. Ureteral margins indistinct distally. Stable left renal cyst. No further imaging workup is needed for this cyst. Mild to moderate left hydronephrosis and moderate left hydroureter. A Foley catheter is embedded in high density material in the lumen of the urinary bladder which was not present on 12/08/2024 and presumably represents a blood product/hematoma. Small amount of gas noted in the urinary bladder. No stones in the kidneys. No perinephric or periureteral stranding. GI AND BOWEL: Stomach demonstrates no acute abnormality. PEG (Percutaneous Endoscopic Gastrostomy) tube noted. No dilated bowel noted. Possible bowel wall thickening in the lower rectum with continued presacral edema. Cannot exclude wall thickening in the ascending colon and hepatic flexure, although the appearance could be due to peristaltic activity. There is no bowel obstruction. PERITONEUM AND RETROPERITONEUM: Suspected mild to moderate ascites. No free air. Diffuse mesenteric edema noted.  VASCULATURE: Systemic atherosclerosis is present, including the aorta and iliac  arteries. LYMPH NODES: No lymphadenopathy. REPRODUCTIVE ORGANS: No acute abnormality. BONES AND SOFT TISSUES: 5.1 x 2.3 cm complex fluid collection along the right iliacus muscle on image 64 series 3, previously 6.0 x 3.5 cm on 12/08/2024. This could be hematoma or abscess. Large sacral decubitus ulcer with chronic bony destructive findings along the S5 segment of the sacrum and sacrococcygeal junction, and a 1.2 cm gap between the lower margin of the sacrum (which may be exposed to the atmosphere) and the remaining coccyx on image 63 series 7. Lower lumbar spondylosis and degenerative disc disease. Mild subcutaneous edema. IMPRESSION: 1. Possible bowel wall thickening in the lower rectum with presacral edema, and cannot exclude wall thickening in the ascending colon and hepatic flexure. 2. Low density blood pool, which can be seen with anemia. 3. Foley catheter embedded in high-density material within the urinary bladder, presumably blood product or hematoma, new from prior, with a small amount of intravesical gas. 4. Mild right hydronephrosis with moderate right hydroureter and mild to moderate left hydronephrosis with moderate left hydroureter, without a definite ureteral stone. 5. 5.1 x 2.3 cm complex fluid collection along the right iliacus muscle, which could represent hematoma or abscess, decreased in size compared to prior. 6. Large sacral decubitus ulcer with chronic bony destructive change at the S5 segment and sacrococcygeal junction, with a 1.2 cm gap between the lower sacrum and remaining coccyx. 7. Moderate right and small left pleural effusions with passive atelectasis. 8. Gallstones with gallbladder wall thickening versus a small amount of pericholecystic fluid. 9. Suspected mild to moderate ascites with diffuse mesenteric edema and mild subcutaneous edema. 10. Splenomegaly with a similar 3 cm inferior splenic lesion. 11. Systemic atherosclerosis including the aorta and iliac arteries. 12. Lower  lumbar spondylosis and degenerative disc disease. Electronically signed by: Ryan Salvage MD 01/02/2025 12:50 PM EST RP Workstation: [id number]    Labs: BMET Recent Labs  Lab 12/30/24 0409 12/30/24 2000 01/01/25 0405 01/01/25 1849 01/01/25 1857 01/02/25 0130 01/03/25 0518 01/03/25 0519  NA 134* 137 134* 137 137 134*  --  133*  K 5.1 4.1 4.8 3.7 3.6 4.0  --  4.5  CL 95* 97* 95* 98 100 95*  --  95*  CO2 28 29 26 26   --  28  --  24  GLUCOSE 110* 124* 109* 105* 109* 104*  --  83  BUN 94* 43* 88* 32* 35* 40*  --  60*  CREATININE 3.28* 1.67* 2.79* 1.33* 1.40* 1.69*  --  2.65*  CALCIUM  9.6 9.2 9.4 8.8*  --  9.0  --  9.4  PHOS 2.8  --  2.5  --   --  2.0* 4.9* 4.9*   CBC Recent Labs  Lab 12/30/24 0409 12/30/24 2000 01/01/25 0405 01/01/25 1849 01/01/25 1857 01/02/25 0130 01/02/25 0927 01/02/25 2120 01/03/25 0518  WBC 10.3   < > 7.1 10.7*  --  8.6  --   --  8.4  NEUTROABS 7.9*  --  4.6 8.7*  --   --   --   --   --   HGB 7.5*   < > 5.5* 8.0*   < > 8.8* 8.6* 8.8* 8.4*  8.4*  HCT 22.7*   < > 16.6* 24.3*   < > 25.2* 25.5* 25.6* 24.8*  24.6*  MCV 92.7   < > 94.3 94.2  --  89.0  --   --  90.5  PLT 73*   < >  73* 74*  --  67*  --   --  71*   < > = values in this interval not displayed.    Medications:     Chlorhexidine  Gluconate Cloth  6 each Topical Q0600   diltiazem   90 mg Per Tube Q8H   feeding supplement (PROSource TF20)  60 mL Per Tube BID   free water   30 mL Per Tube Q2H   gabapentin   100 mg Per Tube QHS   latanoprost   1 drop Both Eyes QHS   levothyroxine   25 mcg Per Tube Q0600   metoprolol  tartrate  50 mg Per Tube BID   midodrine   10 mg Per Tube TID WC   multivitamin  1 tablet Per Tube QHS   nutrition supplement (JUVEN)  1 packet Per Tube BID BM   mouth rinse  15 mL Mouth Rinse 4 times per day   pantoprazole  (PROTONIX ) IV  40 mg Intravenous Q24H   sertraline   50 mg Per Tube Daily   sodium chloride  (PF)          Ephriam Stank, MD [facility] 01/03/2025, 10:58 AM   "

## 2025-01-03 NOTE — Progress Notes (Signed)
 TF resumed per dietician, no need to check residuals per dietician  Doyal Sias

## 2025-01-03 NOTE — Evaluation (Signed)
 Occupational Therapy Evaluation Patient Details Name: Ricardo [name] MRN: [id number] DOB: Dec 18, 1951 Today's Date: 01/03/2025   History of Present Illness   Ricardo [name] is a 74 y.o. male presenting with rectal bleeding. W/u for GIB.  Recently admitted with septic shock in Aug 2025, on dialysis, multiple intubations and PEG placement. Sent to [facility] 10/01/2024. For his chronic sacral wounds, a bedside debridement was done on 12/25/2024.  PMHx of JAK2+ primary myelofibrosis (transfusion dependent follows with heme/onc at the [facility]), prostate cancer, COPD, chronic hypoxic respiratory failure, A-fib, insomnia, gout, and ESRD on HD, PUD     Clinical Impressions Pt worked with OT at bed level for this eval.  He was able to complete oral hygiene with the suction brush and wash his face after setup with min assist and HOB elevated.  He needed max assist for rolling to the left and min assist for rolling to the right with use of the bedrail to assist with toilet hygiene.  HR maintained in the 80s during task.  Pt's daughter present for session and able to assist and provide PLOF information and pt's current progress at [facility].  Feel based on current limitations he will continue to benefit from acute care OT to help work towards regaining some independence with basic selfcare tasks and transfers.  Anticipate pt will need long term rehab post acute stay in order to reach a level that he can regain some independence with ADL tasks and be at a level that family can safely accommodate and assist.       If plan is discharge home, recommend the following:   Two people to help with bathing/dressing/bathroom;Direct supervision/assist for medications management;Assistance with feeding;Two people to help with walking and/or transfers     Functional Status Assessment   Patient has had a recent decline in their functional status and demonstrates the ability to make significant improvements in function  in a reasonable and predictable amount of time.     Equipment Recommendations   Other (comment) (TBD next venue of care)      Precautions/Restrictions   Precautions Precautions: Fall Precaution/Restrictions Comments: Sacral ulcer, foley catheter, PEG tube Restrictions Weight Bearing Restrictions Per Provider Order: No     Mobility Bed Mobility Overal bed mobility: Needs Assistance Bed Mobility: Rolling Rolling: Max assist         General bed mobility comments: Max assist to roll to the left and min assist to the right    Transfers                   General transfer comment: Did not attempt transfer to EOB.      Balance                                           ADL either performed or assessed with clinical judgement   ADL Overall ADL's : Needs assistance/impaired     Grooming: Wash/dry face;Oral care;Bed level;Minimal assistance Grooming Details (indicate cue type and reason): supine with HOB up Upper Body Bathing: Moderate assistance;Bed level Upper Body Bathing Details (indicate cue type and reason): supine HOB up simulated Lower Body Bathing: Total assistance;Bed level                         General ADL Comments: Max assist for rolling to the left with min assist to  the right with use of the bed rails.  Pt's daughter present and helped with providing current levels of assist with OT at [facility] and for PLOF before illness.     Vision Baseline Vision/History: 0 No visual deficits Ability to See in Adequate Light: 0 Adequate Patient Visual Report: No change from baseline Vision Assessment?: No apparent visual deficits Additional Comments: No gross impairments noted     Perception Perception: Not tested       Praxis Praxis: Not tested       Pertinent Vitals/Pain Pain Assessment Pain Assessment: Faces Faces Pain Scale: Hurts little more Pain Location: buttocks wound Pain Descriptors / Indicators:  Discomfort Pain Intervention(s): Limited activity within patient's tolerance     Extremity/Trunk Assessment Upper Extremity Assessment Upper Extremity Assessment: RUE deficits/detail;LUE deficits/detail RUE Deficits / Details: AAROM shoulder flexion 0-100 degrees approximately, elbow flexion/extension strength 3+/5, grip strength 3/5 with noted atrophy in thenar eminence.  Decreased FM coordination noted with finger to thumb attempts. RUE Sensation: WNL RUE Coordination: decreased fine motor;decreased gross motor LUE Deficits / Details: AAROM shoulder flexion 0-100 degrees approximately, elbow flexion/extension strength 3+/5, grip strength 3+/5 LUE Coordination: decreased gross motor;decreased fine motor       Cervical / Trunk Assessment Cervical / Trunk Assessment:  (limitations in cervical ROM all directions per gross assessment)   Communication Communication Communication: Impaired Factors Affecting Communication: Difficulty expressing self;Reduced clarity of speech   Cognition Arousal: Alert Behavior During Therapy: Flat affect Cognition: Difficult to assess Difficult to assess due to: Impaired communication           OT - Cognition Comments: Dysarthric speech                 Following commands: Impaired Following commands impaired: Follows one step commands with increased time, Follows one step commands inconsistently     Cueing  General Comments   Cueing Techniques: Tactile cues;Verbal cues              Home Living Family/patient expects to be discharged to:: Other (Comment) (Was at [facility] and likely will return)                                        Prior Functioning/Environment Prior Level of Function : Independent/Modified Independent             Mobility Comments: Prior to Aug 2025, pt was a tourist information centre manager, no AD.  Since Aug 2025 has been in hospitals/[facility] doing Rehab ADLs Comments: Independent prior to Aug  2025    OT Problem List: Decreased strength;Impaired balance (sitting and/or standing);Decreased range of motion;Decreased coordination;Decreased knowledge of use of DME or AE;Impaired UE functional use;Pain   OT Treatment/Interventions: Self-care/ADL training;Patient/family education;Balance training;Therapeutic activities;Neuromuscular education;Therapeutic exercise;DME and/or AE instruction      OT Goals(Current goals can be found in the care plan section)   Acute Rehab OT Goals Patient Stated Goal: Daughter wants him to get back to how he was 10 months ago and back to being independent OT Goal Formulation: With patient/family Time For Goal Achievement: 01/17/25 Potential to Achieve Goals: Fair   OT Frequency:  Min 1X/week       AM-PAC OT 6 Clicks Daily Activity     Outcome Measure Help from another person eating meals?: Total Help from another person taking care of personal grooming?: A Lot Help from another person toileting, which includes using toliet,  bedpan, or urinal?: Total Help from another person bathing (including washing, rinsing, drying)?: Total Help from another person to put on and taking off regular upper body clothing?: A Lot Help from another person to put on and taking off regular lower body clothing?: Total 6 Click Score: 8   End of Session Nurse Communication: Other (comment) (assist needed for rolling)  Activity Tolerance: Patient tolerated treatment well Patient left: in bed  OT Visit Diagnosis: Muscle weakness (generalized) (M62.81)                Time: 8845-8770 OT Time Calculation (min): 35 min Charges:  OT General Charges $OT Visit: 1 Visit OT Evaluation $OT Eval Moderate Complexity: 1 Mod OT Treatments $Self Care/Home Management : 8-22 mins  Lynwood Constant, OTR/L Acute Rehabilitation Services  Office [phone] 01/03/2025

## 2025-01-03 NOTE — Progress Notes (Signed)
 "    Daily Progress Note Intern Pager: [phone]  Patient name: Ricardo [name] Medical record number: [id number] Date of birth: 1951-11-30 Age: 74 y.o. Gender: male  Primary Care Provider: [facility] [place] Consultants: GI, Urology, Palliative  Code Status: DNAR/DNI  Pt Overview and Major Events to Date:  01/01/25 : Admitted  Mr. Ricardo [name] is a 74 YO male w/ PMH of ERSD on iHD(T/TH/Sat), recurrent GI bleed, JAK 2+ primary myelofibrosis, Prostate Ca, Chronic Hypoxia Resp Failure, A-fib ( not on anticoagulant), Gout, and PUD. He was admitted at [facility] in the last 3 months after hospitalized w/ Sepsis complicated by extensive sacral pressure ulcer w/ osteomyelitis, E Coli peritonitis, and encephalopathy. He has been admitted for c/o GI bleed Assessment & Plan GI bleed Primary myelofibrosis (HCC) Received 2u pRBC, 1u pRBC in ED. Last transfusion was on 01/02/25.  H/H 8.4/24.8 this AM. CTAP no evidence of GI acute abnormality  - GI consult, appreciate recommendation   - Suspected bleeding from ischemic colitis   - No EGD plan as pt is a poor candidate for sedation  - Trend H/H Daily w/ AM CBC - Heme Onc   - Reached out to on-call Heme Onc Dr. Cloretta on Sat 01/03/25 who recommended contacting his Heme Onc at the [facility] on Monday for additional rec.  - Recommending transfuse platelets for less than 20 or uncontrolled bleeding, - Continue TF through PEG per RD recommendation  - Continue IV Protonix  40 mg Daily  - Pain control: Tylenol , Oxycodone  - Palliative consult and follow, appreciate recommendation  - PT/OT evaluation  - Fall precautions initiated - Delirium precautions initiated - AM RFP, CBC, LFT Hematuria CTAP on 01/02/25 shows concern of new possible blood product/hematoma at the end of foley w/ intravesical gas. 3 ways foley was place by urology. Unable to get accurate UA/Urine Cx d/t blood in urine - Urology consult, appreciate recommendations  - successfully  placed 15fr 3 ways foley w/ clot evacuation 10 ml left in balloon on 01/02/25  - Irrigate urinary catheter w/Normal Saline 70 ml Q8H or as needed  ESRD (end stage renal disease) on dialysis (HCC) T/Th/Sat HD schedule. 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  He has been on IV Meropenam and Lizolid which ended date initially plan for 01/30/25 for the total of 6 week course.  - General Surgery consult and S/O  - Advised against debridement to create larger wound which unlikely to heal  - Infection Disease  - Dr. Fleeta Rothman, ID was contacted on Sat 01/02/25. He agreed with STOP IV meropenum 500mg  and IV linezolid  600mg  d/t thrombocytopenia as pt had more than 2 weeks of the medication considering Chronic pressure ulcer/Osteomyelitis - Q 2 Turning while in bed and Q15 min while in chair - Wound care consult and follow, appreciate recommendation - Replaced mattress  - PT/OT consult Atrial fibrillation (HCC) Not currently on anticoagulation d/t his high risk of bleeding - Continue home diltiazem  90mg  and metoprolol  50mg  BID once CTAP negative  Severe protein-calorie malnutrition PEG (percutaneous endoscopic gastrostomy) status (HCC) Underweight (BMI < 18.5) Dietitian consult, appreciate recommendation - Continue TF via G-tube  - Starting Jevity 1.5 at 25 ml/hr, advance by 10ml every 4 hrs to goal of  65 ml/hr ( 1,560 ml per day)  - 60 ml ProSource TF BID - Flush 30 ml Q2H1 packet fiber supplement TID - Juven BID - Strict NPO diet - Renal multivitamin / minerals daily - Micronutrient labs : Vit A, C, Zinc , and CRP  Chronic health problem H/o metabolic encephalopathy and insomnia: continue  home trazodone  100mg  per tube at bedtime and home sertraline  50mg  per tube daily  Hypotension: continue home midodrine  10mg  per tube TID Hypothyroidism: continue home levothyroxine  25mcg per tube daily Neuropathy: continue gabapentin  100mg  per tube QHS  FEN/GI: NPO d/t AMS. Enteral feeding through PEG PPx: SCD. C/o active bleeding Dispo:Pending clinical improvement   Subjective:  No distress or agitation during round. No distress on RA. No s/s for pain. Rest comfortable in bed  Objective: Temp:  [97.8 F (36.6 C)-98.9 F (37.2 C)] 98.3 F (36.8 C) (01/17 0353) Pulse Rate:  [72-87] 87 (01/17 0353) Resp:  [15-23] 15 (01/17 0353) BP: (105-136)/(46-64) 118/63 (01/17 0353) SpO2:  [95 %-100 %] 95 % (01/17 0353) Weight:  [59.2 kg] 59.2 kg (01/17 0453)  Physical Exam Constitutional:      Appearance: He is ill-appearing.  Cardiovascular:     Rate and Rhythm: Normal rate.  Pulmonary:     Breath sounds: Rhonchi present.  Abdominal:     Palpations: Abdomen is soft.     Comments: G tube in place  Genitourinary:    Comments: Foley w/ pinkish Urine Output Neurological:     Mental Status: He is disoriented.      Laboratory: Most recent CBC Lab Results  Component Value Date   WBC 8.4 01/03/2025   HGB 8.4 (L) 01/03/2025   HGB 8.4 (L) 01/03/2025   HCT 24.6 (L) 01/03/2025   HCT 24.8 (L) 01/03/2025   MCV 90.5 01/03/2025   PLT 71 (L) 01/03/2025   Most recent BMP    Latest Ref Rng & Units 01/03/2025    5:19 AM  BMP  Glucose 70 - 99 mg/dL 83   BUN 8 - 23 mg/dL 60   Creatinine 9.38 - 1.24 mg/dL 7.34   Sodium 864 - 854 mmol/L 133   Potassium 3.5 - 5.1 mmol/L 4.5   Chloride 98 - 111 mmol/L 95   CO2 22 - 32 mmol/L 24   Calcium  8.9 - 10.3 mg/dL 9.4      Suzen Elder B, DO 01/03/2025, 9:25 AM  PGY-1, [facility] FPTS Intern pager: [phone], text pages welcome Secure chat group [facility] Teaching Service   "

## 2025-01-03 NOTE — Assessment & Plan Note (Signed)
 Dietitian consult, appreciate recommendation - Continue TF via G-tube  - Starting Jevity 1.5 at 25 ml/hr, advance by 10ml every 4 hrs to goal of 65 ml/hr ( 1,560 ml per day)  - 60 ml ProSource TF BID - Flush 30 ml Q2H1 packet fiber supplement TID - Juven BID - Strict NPO diet - Renal multivitamin / minerals daily - Micronutrient labs : Vit A, C, Zinc , and CRP

## 2025-01-03 NOTE — Assessment & Plan Note (Signed)
 CTAP on 01/02/25 shows concern of new possible blood product/hematoma at the end of foley w/ intravesical gas. 3 ways foley was place by urology. Unable to get accurate UA/Urine Cx d/t blood in urine - Urology consult, appreciate recommendations  - successfully placed 20fr 3 ways foley w/ clot evacuation 10 ml left in balloon on 01/02/25  - Irrigate urinary catheter w/Normal Saline 70 ml Q8H or as needed

## 2025-01-03 NOTE — Progress Notes (Signed)
 residual noted via PEG Tube. Tube feeding held. MD and Dietician made aware. Majel Giel LPN.

## 2025-01-03 NOTE — Progress Notes (Signed)
 Initial Nutrition Assessment  DOCUMENTATION CODES:  Underweight  INTERVENTION:  Tube feeds via G-tube: Start Jevity 1.5 at 25 mL/hr, advance by 10 mL every 4 hours to goal rate of 65 mL/hr (1560 mL per day) 60 mL ProSource TF20 - BID Flush: 30 mL every 2 hours Regimen at goal provides 2500 calories, 140 gm protein, and 1546 mL free water  daily. Juven BID, each packet provides 95 calories, 2.5 grams of protein (collagen), and 9.8 grams of carbohydrate (3 grams sugar); also contains 7 grams of L-arginine and L-glutamine, 300 mg vitamin C, 15 mg vitamin E, 1.2 mcg vitamin B-12, 9.5 mg zinc , 200 mg calcium , and 1.5 g  Calcium  Beta-hydroxy-Beta-methylbutyrate to support wound healing Renal Multivitamin w/ minerals daily Checking the following micronutrient labs given chronic wounds: Vitamin A , Vitamin C, Zinc , CRP  NUTRITION DIAGNOSIS:  Increased nutrient needs related to wound healing as evidenced by estimated needs.  GOAL:  Patient will meet greater than or equal to 90% of their needs  MONITOR:  Labs, Skin, TF tolerance, I & O's  REASON FOR ASSESSMENT:  Consult Enteral/tube feeding initiation and management  ASSESSMENT: 74 y.o. male presented with rectal bleeding from [facility]. Pt with recent complex medical history from August 2025, with anuric renal failure, septic shock, enterococcus faecalis peritonitis, PEG placed on 10/1, chronic thrombocytopenia, and chronic wounds. PMH includes prostate cancer, GERD, anemia, gout, COPD, ESRD on HD, and depression. Pt admitted for GI bleed.   1/15 - Admitted   Patient admitted for GI bleed, GI team with no plans for intervention at this time. Surgery consulted for possible debridement of sacral wound but are not recommending any surgical interventions at this time. Family has met with Palliative Care and are agreeable to ICU transfer and pressors but does not want a trach.   RD working remotely at time of assessment. Patient  is disoriented x4 and unable to provide any history. Per chart review, patient was on Nepro at 65 mL/hr with q2h free water  flush at [facility] (estimated 2574 kcal and 116 gm protein). Providing Juven daily. Patient also noted to be having multiple episodes of diarrhea daily when at facility, was receiving Banatrol TF - 3 times per day.   Patient with labs within normal limits and do not indicate need for Nepro tube feeds. Nepro could be contributing to loose stools. Recommend trial on alternative formula to assess for better tolerance.  Minimal weight history within the past year in chart. Weight in July 2025 appears to be copied froward from previous encounter in late 2024. Suspect that patient meets criteria for malnutrition but unable to diagnosis at this time given unable to complete physical exam.   Admission Weight: 59 kg Current Weight: 59.2 kg  Nutrition Related Medications: Protonix  Labs: Sodium 133, Potassium 4.5, Phosphorus 4.9, Magnesium 2.3, Vitamin B12 1605  UOP: 200 mL x 24 hrs  NUTRITION - FOCUSED PHYSICAL EXAM:  Deferred.  Diet Order:   Diet Order             Diet NPO time specified  Diet effective now                  EDUCATION NEEDS:  Not appropriate for education at this time  Skin:  Skin Assessment: Reviewed RN Assessment Per WOC note on 01/03/24:  Stage 4 - Sacrum 20 cm x 20 cm x 2 cm.  DTI - L ear and nose  Last BM:  1/17 - Type 7  Height:  Ht  Readings from Last 1 Encounters:  01/01/25 6' (1.829 m)   Weight:  Wt Readings from Last 1 Encounters:  01/03/25 59.2 kg   Ideal Body Weight:  80.9 kg  BMI:  Body mass index is 17.7 kg/m.  Estimated Nutritional Needs:  Kcal:  2300-2600 Protein:  120-140 grams Fluid:  UOP + 1L   Nestora Glatter RD, LDN Registered Dietitian I Please see AMION for contact information

## 2025-01-03 NOTE — Assessment & Plan Note (Signed)
 T/Th/Sat HD schedule. Plan for iHD today  - Nephrology consult, appreciate recommendation - AM RFP, Mg  - Avoid neurotoxic medications - Strict I&O

## 2025-01-04 DIAGNOSIS — K922 Gastrointestinal hemorrhage, unspecified: Secondary | ICD-10-CM | POA: Diagnosis not present

## 2025-01-04 LAB — HEPATIC FUNCTION PANEL
ALT: 26 U/L (ref 0–44)
AST: 48 U/L — ABNORMAL HIGH (ref 15–41)
Albumin: 2.6 g/dL — ABNORMAL LOW (ref 3.5–5.0)
Alkaline Phosphatase: 300 U/L — ABNORMAL HIGH (ref 38–126)
Bilirubin, Direct: 0.1 mg/dL (ref 0.0–0.2)
Indirect Bilirubin: 0.4 mg/dL (ref 0.3–0.9)
Total Bilirubin: 0.5 mg/dL (ref 0.0–1.2)
Total Protein: 7.2 g/dL (ref 6.5–8.1)

## 2025-01-04 LAB — HEMOGLOBIN AND HEMATOCRIT, BLOOD
HCT: 21.8 % — ABNORMAL LOW (ref 39.0–52.0)
Hemoglobin: 7.3 g/dL — ABNORMAL LOW (ref 13.0–17.0)

## 2025-01-04 LAB — CBC WITH DIFFERENTIAL/PLATELET
Abs Immature Granulocytes: 0.23 K/uL — ABNORMAL HIGH (ref 0.00–0.07)
Basophils Absolute: 0.1 K/uL (ref 0.0–0.1)
Basophils Relative: 2 %
Eosinophils Absolute: 0.2 K/uL (ref 0.0–0.5)
Eosinophils Relative: 3 %
HCT: 21.8 % — ABNORMAL LOW (ref 39.0–52.0)
Hemoglobin: 7.3 g/dL — ABNORMAL LOW (ref 13.0–17.0)
Immature Granulocytes: 3 %
Lymphocytes Relative: 15 %
Lymphs Abs: 1.2 K/uL (ref 0.7–4.0)
MCH: 30.7 pg (ref 26.0–34.0)
MCHC: 33.5 g/dL (ref 30.0–36.0)
MCV: 91.6 fL (ref 80.0–100.0)
Monocytes Absolute: 0.8 K/uL (ref 0.1–1.0)
Monocytes Relative: 10 %
Neutro Abs: 5.3 K/uL (ref 1.7–7.7)
Neutrophils Relative %: 67 %
Platelets: 68 K/uL — ABNORMAL LOW (ref 150–400)
RBC: 2.38 MIL/uL — ABNORMAL LOW (ref 4.22–5.81)
RDW: 16.1 % — ABNORMAL HIGH (ref 11.5–15.5)
Smear Review: NORMAL
WBC: 7.9 K/uL (ref 4.0–10.5)
nRBC: 0.8 % — ABNORMAL HIGH (ref 0.0–0.2)

## 2025-01-04 LAB — GLUCOSE, CAPILLARY
Glucose-Capillary: 105 mg/dL — ABNORMAL HIGH (ref 70–99)
Glucose-Capillary: 113 mg/dL — ABNORMAL HIGH (ref 70–99)
Glucose-Capillary: 123 mg/dL — ABNORMAL HIGH (ref 70–99)
Glucose-Capillary: 129 mg/dL — ABNORMAL HIGH (ref 70–99)
Glucose-Capillary: 131 mg/dL — ABNORMAL HIGH (ref 70–99)
Glucose-Capillary: 144 mg/dL — ABNORMAL HIGH (ref 70–99)

## 2025-01-04 LAB — RENAL FUNCTION PANEL
Albumin: 2.6 g/dL — ABNORMAL LOW (ref 3.5–5.0)
Anion gap: 13 (ref 5–15)
BUN: 44 mg/dL — ABNORMAL HIGH (ref 8–23)
CO2: 25 mmol/L (ref 22–32)
Calcium: 9 mg/dL (ref 8.9–10.3)
Chloride: 95 mmol/L — ABNORMAL LOW (ref 98–111)
Creatinine, Ser: 1.78 mg/dL — ABNORMAL HIGH (ref 0.61–1.24)
GFR, Estimated: 40 mL/min — ABNORMAL LOW
Glucose, Bld: 112 mg/dL — ABNORMAL HIGH (ref 70–99)
Phosphorus: 2.6 mg/dL (ref 2.5–4.6)
Potassium: 4.1 mmol/L (ref 3.5–5.1)
Sodium: 133 mmol/L — ABNORMAL LOW (ref 135–145)

## 2025-01-04 LAB — MAGNESIUM: Magnesium: 2.1 mg/dL (ref 1.7–2.4)

## 2025-01-04 LAB — PHOSPHORUS: Phosphorus: 2.6 mg/dL (ref 2.5–4.6)

## 2025-01-04 NOTE — Assessment & Plan Note (Addendum)
 No anticoagulation given high risk of bleed  -- Continue home diltiazem  90mg  and metoprolol  50mg  BID

## 2025-01-04 NOTE — Assessment & Plan Note (Addendum)
 Severe sacral pressure ulcer with osteomyelitis, present on admission.  Was on IV meropenem  and linezolid  at OSH, discontinued per ID as patient had completed sufficient course for indication and likely contribution to thrombocytopenia. --Risks of surgical debridement outweigh benefit per general surgery, signed off --IV antibiotics discontinued per ID as above --Turn patient over 2 hours while in bed, pressure relief every 15 minutes when in chair  --Wound care per WOC consult note 1/16 -- PT/OT eval and treat

## 2025-01-04 NOTE — Plan of Care (Signed)
   Problem: Education: Goal: Knowledge of General Education information will improve Description Including pain rating scale, medication(s)/side effects and non-pharmacologic comfort measures Outcome: Progressing

## 2025-01-04 NOTE — Assessment & Plan Note (Addendum)
 Likely ESRD however nephrology monitoring for signs of renal recovery. UOP overnight. --Recommendations per nephrology:  --Continue TTS HD schedule  --Daily RFP, Mg  --Strict I&O -- Avoid nephrotoxic medications

## 2025-01-04 NOTE — Plan of Care (Incomplete)
" °  Problem: Education: Goal: Knowledge of General Education information will improve Description: Including pain rating scale, medication(s)/side effects and non-pharmacologic comfort measures Outcome: Progressing   Problem: Health Behavior/Discharge Planning: Goal: Ability to manage health-related needs will improve Outcome: Progressing   Problem: Clinical Measurements: Goal: Ability to maintain clinical measurements within normal limits will improve Outcome: Progressing Goal: Will remain free from infection Outcome: Progressing Goal: Diagnostic test results will improve Outcome: Progressing Goal: Respiratory complications will improve Outcome: Progressing Goal: Cardiovascular complication will be avoided Outcome: Progressing   Problem: Activity: Goal: Risk for activity intolerance will decrease Outcome: Progressing   Problem: Nutrition: Goal: Adequate nutrition will be maintained Outcome: Progressing   Problem: Coping: Goal: Level of anxiety will decrease Outcome: Progressing   Problem: Elimination: Goal: Will not experience complications related to bowel motility Outcome: Progressing Goal: Will not experience complications related to urinary retention Outcome: Progressing   Problem: Pain Managment: Goal: General experience of comfort will improve and/or be controlled Outcome: Progressing   Problem: Safety: Goal: Ability to remain free from injury will improve Outcome: Progressing   Problem: Skin Integrity: Goal: Risk for impaired skin integrity will decrease Outcome: Progressing   Problem: Education: Goal: Knowledge of disease and its progression will improve Outcome: Progressing Goal: Individualized Educational Video(s) Outcome: Progressing   Problem: Fluid Volume: Goal: Compliance with measures to maintain balanced fluid volume will improve Outcome: Progressing   Problem: Health Behavior/Discharge Planning: Goal: Ability to manage health-related needs  will improve Outcome: Progressing   Problem: Nutritional: Goal: Ability to make healthy dietary choices will improve Outcome: Progressing   Problem: Clinical Measurements: Goal: Complications related to the disease process, condition or treatment will be avoided or minimized Outcome: Progressing   Problem: Bowel/Gastric: Goal: Will show no signs and symptoms of gastrointestinal bleeding Outcome: Progressing   Problem: Fluid Volume: Goal: Will show no signs and symptoms of excessive bleeding Outcome: Progressing   Problem: Clinical Measurements: Goal: Complications related to the disease process, condition or treatment will be avoided or minimized Outcome: Progressing   "

## 2025-01-04 NOTE — Assessment & Plan Note (Addendum)
 PEG feeds recommendations per RD - Continue TF via G-tube  - Starting Jevity 1.5 at 25 ml/hr, advance by 10ml every 4 hrs to goal of 65 ml/hr ( 1,560 ml per day)  - 60 ml ProSource TF BID - Flush 30mL q2hr -MVI tablet daily - Juven BID - Strict NPO diet - checking micronutrient labs : Vitamin A , vitamin C, zinc 

## 2025-01-04 NOTE — Progress Notes (Signed)
 " [facility] Progress Note   Subjective:   Seen in room, alert, denies any concerns. Denies SOB, CP, dizziness.   Objective Vitals:   01/03/25 1758 01/03/25 1943 01/04/25 0420 01/04/25 0829  BP: (!) 127/54 (!) 113/57 (!) 107/44 (!) 109/43  Pulse: 98 99 83 88  Resp: (!) 28 17 17    Temp: 98.7 F (37.1 C) 98.4 F (36.9 C) 97.7 F (36.5 C)   TempSrc:   Oral   SpO2: 100% 98% 99%   Weight:      Height:       Physical Exam General: chronically ill appearing, thin male in NAD Heart: RRR, no murmurs Lungs: CTA bilaterally, respirations unlabored Abdomen: Soft, non-distended, +BS Extremities: No edema b/l lower extremities Dialysis Access:  [facility]  Additional Objective Labs: Basic Metabolic Panel: Recent Labs  Lab 01/02/25 0130 01/03/25 0518 01/03/25 0519 01/04/25 0412 01/04/25 0413  NA 134*  --  133*  --  133*  K 4.0  --  4.5  --  4.1  CL 95*  --  95*  --  95*  CO2 28  --  24  --  25  GLUCOSE 104*  --  83  --  112*  BUN 40*  --  60*  --  44*  CREATININE 1.69*  --  2.65*  --  1.78*  CALCIUM  9.0  --  9.4  --  9.0  PHOS 2.0*   < > 4.9* 2.6 2.6   < > = values in this interval not displayed.   Liver Function Tests: Recent Labs  Lab 12/30/24 2000 01/01/25 0405 01/01/25 1849 01/02/25 0130 01/03/25 0519 01/04/25 0412 01/04/25 0413  AST 52*  --  54*  --   --  48*  --   ALT 36  --  32  --   --  26  --   ALKPHOS 378*  --  327*  --   --  300*  --   BILITOT 0.4  --  0.5  --   --  0.5  --   PROT 7.8  --  7.4  --   --  7.2  --   ALBUMIN 2.7*   < > 2.5*   < > 2.7* 2.6* 2.6*   < > = values in this interval not displayed.   No results for input(s): LIPASE, AMYLASE in the last 168 hours. CBC: Recent Labs  Lab 01/01/25 0405 01/01/25 1849 01/01/25 1857 01/02/25 0130 01/02/25 0927 01/03/25 0518 01/03/25 2240 01/04/25 0412  WBC 7.1 10.7*  --  8.6  --  8.4  --  7.9  NEUTROABS 4.6 8.7*  --   --   --   --   --  5.3  HGB 5.5* 8.0*   < > 8.8*   < > 8.4*   8.4* 7.6* 7.3*  HCT 16.6* 24.3*   < > 25.2*   < > 24.8*  24.6* 22.8* 21.8*  MCV 94.3 94.2  --  89.0  --  90.5  --  91.6  PLT 73* 74*  --  67*  --  71*  --  68*   < > = values in this interval not displayed.   Blood Culture    Component Value Date/Time   SDES BLOOD RIGHT HAND 12/10/2024 1438   SPECREQUEST  12/10/2024 1438    BOTTLES DRAWN AEROBIC ONLY Blood Culture adequate volume   CULT  12/10/2024 1438    NO GROWTH 5 DAYS Performed at [facility]  Lab, [address] [place], [place] [address]    REPTSTATUS 12/15/2024 FINAL 12/10/2024 1438    Cardiac Enzymes: No results for input(s): CKTOTAL, CKMB, CKMBINDEX, TROPONINI in the last 168 hours. CBG: Recent Labs  Lab 01/03/25 1339 01/04/25 0006 01/04/25 0418 01/04/25 0748  GLUCAP 99 105* 113* 123*   Iron Studies:  Recent Labs    01/01/25 2019  IRON 83  TIBC 209*  FERRITIN >7,500*   @lablastinr3 @ Studies/Results: CT ABDOMEN PELVIS WO CONTRAST Result Date: 01/02/2025 EXAM: CT ABDOMEN AND PELVIS WITHOUT CONTRAST 01/02/2025 12:07:35 PM TECHNIQUE: CT of the abdomen and pelvis was performed without the administration of intravenous contrast. Multiplanar reformatted images are provided for review. Automated exposure control, iterative reconstruction, and/or weight-based adjustment of the mA/kV was utilized to reduce the radiation dose to as low as reasonably achievable. COMPARISON: 12/08/2024 CLINICAL HISTORY: Lower GI bleed. FINDINGS: LOWER CHEST: Moderate right and small left pleural effusion with passive atelectasis. The catheter terminates in the right atrium. Low density blood pool suggests anemia. LIVER: The liver is unremarkable. GALLBLADDER AND BILE DUCTS: Gallbladder wall thickening versus small amount of pericholecystic fluid. Gallstones noted. No biliary ductal dilatation. SPLEEN: Splenomegaly. Similar appearance of 3 cm lesion inferiorly in the spleen. PANCREAS: No acute abnormality. ADRENAL GLANDS: No acute  abnormality. KIDNEYS, URETERS AND BLADDER: Mild right hydronephrosis and moderate right hydroureter. No definite ureteral stone seen. Ureteral margins indistinct distally. Stable left renal cyst. No further imaging workup is needed for this cyst. Mild to moderate left hydronephrosis and moderate left hydroureter. A Foley catheter is embedded in high density material in the lumen of the urinary bladder which was not present on 12/08/2024 and presumably represents a blood product/hematoma. Small amount of gas noted in the urinary bladder. No stones in the kidneys. No perinephric or periureteral stranding. GI AND BOWEL: Stomach demonstrates no acute abnormality. PEG (Percutaneous Endoscopic Gastrostomy) tube noted. No dilated bowel noted. Possible bowel wall thickening in the lower rectum with continued presacral edema. Cannot exclude wall thickening in the ascending colon and hepatic flexure, although the appearance could be due to peristaltic activity. There is no bowel obstruction. PERITONEUM AND RETROPERITONEUM: Suspected mild to moderate ascites. No free air. Diffuse mesenteric edema noted. VASCULATURE: Systemic atherosclerosis is present, including the aorta and iliac arteries. LYMPH NODES: No lymphadenopathy. REPRODUCTIVE ORGANS: No acute abnormality. BONES AND SOFT TISSUES: 5.1 x 2.3 cm complex fluid collection along the right iliacus muscle on image 64 series 3, previously 6.0 x 3.5 cm on 12/08/2024. This could be hematoma or abscess. Large sacral decubitus ulcer with chronic bony destructive findings along the S5 segment of the sacrum and sacrococcygeal junction, and a 1.2 cm gap between the lower margin of the sacrum (which may be exposed to the atmosphere) and the remaining coccyx on image 63 series 7. Lower lumbar spondylosis and degenerative disc disease. Mild subcutaneous edema. IMPRESSION: 1. Possible bowel wall thickening in the lower rectum with presacral edema, and cannot exclude wall thickening in  the ascending colon and hepatic flexure. 2. Low density blood pool, which can be seen with anemia. 3. Foley catheter embedded in high-density material within the urinary bladder, presumably blood product or hematoma, new from prior, with a small amount of intravesical gas. 4. Mild right hydronephrosis with moderate right hydroureter and mild to moderate left hydronephrosis with moderate left hydroureter, without a definite ureteral stone. 5. 5.1 x 2.3 cm complex fluid collection along the right iliacus muscle, which could represent hematoma or abscess, decreased in size compared  to prior. 6. Large sacral decubitus ulcer with chronic bony destructive change at the S5 segment and sacrococcygeal junction, with a 1.2 cm gap between the lower sacrum and remaining coccyx. 7. Moderate right and small left pleural effusions with passive atelectasis. 8. Gallstones with gallbladder wall thickening versus a small amount of pericholecystic fluid. 9. Suspected mild to moderate ascites with diffuse mesenteric edema and mild subcutaneous edema. 10. Splenomegaly with a similar 3 cm inferior splenic lesion. 11. Systemic atherosclerosis including the aorta and iliac arteries. 12. Lower lumbar spondylosis and degenerative disc disease. Electronically signed by: Ryan Salvage MD 01/02/2025 12:50 PM EST RP Workstation: [id number]   Medications:  feeding supplement (JEVITY 1.5 CAL/FIBER) 65 mL/hr at 01/04/25 0538    Chlorhexidine  Gluconate Cloth  6 each Topical Q0600   diltiazem   90 mg Per Tube Q8H   feeding supplement (PROSource TF20)  60 mL Per Tube BID   free water   30 mL Per Tube Q2H   gabapentin   100 mg Per Tube QHS   latanoprost   1 drop Both Eyes QHS   levothyroxine   25 mcg Per Tube Q0600   metoprolol  tartrate  50 mg Per Tube BID   midodrine   10 mg Per Tube TID WC   multivitamin  1 tablet Per Tube QHS   nutrition supplement (JUVEN)  1 packet Per Tube BID BM   mouth rinse  15 mL Mouth Rinse 4 times per day    pantoprazole  (PROTONIX ) IV  40 mg Intravenous Q24H   sertraline   50 mg Per Tube Daily   Assessment/Plan: Dialysis dependent AKI  -has been on renal replacement therapy since August 2025 (since [facility] to [facility]). Has been doing HD TTS at [facility] -may be deemed as ESRD, let's watch for any semblence of renal recovery in the interim before deeming as ESRD therefore please monitor strict I/O & daily labs. Some UOP but not enough to stop dialysis at this time.  -HD on TTS schedule, today   Anemia of Chronic Kidney Disease ABLA, GI Bleed Transfusion dependent myelofibrosis -transfuse prn for hgb <7 -GI following--conservative mgmt for now   Hematuria -intermittent issue, h/o prostate Ca per chart -s/p irrigation with urology 1/16   Chronic osteomyelitis -on meropenem  and linezolid   until 01/31/24 -per primary   Severe protein-calorie malnutirion -with PEG -per primary   Secondary hyperparathyroidism -phos and calcium  at goal   GOC -palliative following  Lucie Collet, PA-C 01/04/2025, 9:59 AM  [facility] Pager: [phone]   "

## 2025-01-04 NOTE — Plan of Care (Signed)
 " Problem: Education: Goal: Knowledge of General Education information will improve Description: Including pain rating scale, medication(s)/side effects and non-pharmacologic comfort measures 01/04/2025 1700 by Sheran Loving, LPN Outcome: Progressing 01/04/2025 1700 by Sheran Loving, LPN Outcome: Progressing   Problem: Health Behavior/Discharge Planning: Goal: Ability to manage health-related needs will improve 01/04/2025 1700 by Sheran Loving, LPN Outcome: Progressing 01/04/2025 1700 by Sheran Loving, LPN Outcome: Progressing   Problem: Clinical Measurements: Goal: Ability to maintain clinical measurements within normal limits will improve 01/04/2025 1700 by Sheran Loving, LPN Outcome: Progressing 01/04/2025 1700 by Sheran Loving, LPN Outcome: Progressing Goal: Will remain free from infection 01/04/2025 1700 by Sheran Loving, LPN Outcome: Progressing 01/04/2025 1700 by Sheran Loving, LPN Outcome: Progressing Goal: Diagnostic test results will improve 01/04/2025 1700 by Sheran Loving, LPN Outcome: Progressing 01/04/2025 1700 by Sheran Loving, LPN Outcome: Progressing Goal: Respiratory complications will improve 01/04/2025 1700 by Sheran Loving, LPN Outcome: Progressing 01/04/2025 1700 by Sheran Loving, LPN Outcome: Progressing Goal: Cardiovascular complication will be avoided 01/04/2025 1700 by Sheran Loving, LPN Outcome: Progressing 01/04/2025 1700 by Sheran Loving, LPN Outcome: Progressing   Problem: Activity: Goal: Risk for activity intolerance will decrease 01/04/2025 1700 by Sheran Loving, LPN Outcome: Progressing 01/04/2025 1700 by Sheran Loving, LPN Outcome: Progressing   Problem: Nutrition: Goal: Adequate nutrition will be maintained 01/04/2025 1700 by Sheran Loving, LPN Outcome: Progressing 01/04/2025 1700 by Sheran Loving,  LPN Outcome: Progressing   Problem: Coping: Goal: Level of anxiety will decrease 01/04/2025 1700 by Sheran Loving, LPN Outcome: Progressing 01/04/2025 1700 by Sheran Loving, LPN Outcome: Progressing   Problem: Elimination: Goal: Will not experience complications related to bowel motility 01/04/2025 1700 by Sheran Loving, LPN Outcome: Progressing 01/04/2025 1700 by Sheran Loving, LPN Outcome: Progressing Goal: Will not experience complications related to urinary retention 01/04/2025 1700 by Sheran Loving, LPN Outcome: Progressing 01/04/2025 1700 by Sheran Loving, LPN Outcome: Progressing   Problem: Pain Managment: Goal: General experience of comfort will improve and/or be controlled 01/04/2025 1700 by Sheran Loving, LPN Outcome: Progressing 01/04/2025 1700 by Sheran Loving, LPN Outcome: Progressing   Problem: Safety: Goal: Ability to remain free from injury will improve 01/04/2025 1700 by Sheran Loving, LPN Outcome: Progressing 01/04/2025 1700 by Sheran Loving, LPN Outcome: Progressing   Problem: Skin Integrity: Goal: Risk for impaired skin integrity will decrease 01/04/2025 1700 by Sheran Loving, LPN Outcome: Progressing 01/04/2025 1700 by Sheran Loving, LPN Outcome: Progressing   Problem: Education: Goal: Knowledge of disease and its progression will improve 01/04/2025 1700 by Sheran Loving, LPN Outcome: Progressing 01/04/2025 1700 by Sheran Loving, LPN Outcome: Progressing Goal: Individualized Educational Video(s) 01/04/2025 1700 by Sheran Loving, LPN Outcome: Progressing 01/04/2025 1700 by Sheran Loving, LPN Outcome: Progressing   Problem: Fluid Volume: Goal: Compliance with measures to maintain balanced fluid volume will improve 01/04/2025 1700 by Sheran Loving, LPN Outcome: Progressing 01/04/2025 1700 by Sheran Loving,  LPN Outcome: Progressing   Problem: Health Behavior/Discharge Planning: Goal: Ability to manage health-related needs will improve 01/04/2025 1700 by Sheran Loving, LPN Outcome: Progressing 01/04/2025 1700 by Sheran Loving, LPN Outcome: Progressing   Problem: Nutritional: Goal: Ability to make healthy dietary choices will improve 01/04/2025 1700 by Sheran Loving, LPN Outcome: Progressing 01/04/2025 1700 by Sheran Loving, LPN Outcome: Progressing   Problem: Clinical Measurements: Goal: Complications related to the disease process, condition or treatment will be avoided or minimized 01/04/2025 1700 by Sheran Loving, LPN Outcome: Progressing 01/04/2025 1700 by Sheran Loving, LPN Outcome: Progressing   Problem: Bowel/Gastric: Goal: Will show no signs and symptoms of gastrointestinal  bleeding 01/04/2025 1700 by Sheran Loving, LPN Outcome: Progressing 01/04/2025 1700 by Sheran Loving, LPN Outcome: Progressing   Problem: Fluid Volume: Goal: Will show no signs and symptoms of excessive bleeding 01/04/2025 1700 by Sheran Loving, LPN Outcome: Progressing 01/04/2025 1700 by Sheran Loving, LPN Outcome: Progressing   Problem: Clinical Measurements: Goal: Complications related to the disease process, condition or treatment will be avoided or minimized 01/04/2025 1700 by Sheran Loving, LPN Outcome: Progressing 01/04/2025 1700 by Sheran Loving, LPN Outcome: Progressing   "

## 2025-01-04 NOTE — Progress Notes (Signed)
 "    Daily Progress Note Intern Pager: [phone]  Patient name: Ricardo [name] Medical record number: [id number] Date of birth: 10/22/1951 Age: 74 y.o. Gender: male  Primary Care Provider: [facility] [place] Consultants: GI, urology, palliative Code Status: DNR, DNI  Pt Overview and Major Events to Date:  01/01/2025: Admitted  Medical Decision Making: Antwaun Buth is a 74 year old male admitted for GI bleed.  He was transferred from a specialty hospital, where he has resided for 3 months due to sepsis complicated by sacral pressure ulcer and osteomyelitis, E. coli peritonitis and encephalopathy.  Hemoglobin stable after 3 units of blood, GI suspects ischemic colitis.  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  Assessment & Plan GI bleed Primary myelofibrosis (HCC) Received 2u pRBC, 1u pRBC in ED. Last transfusion 01/02/25.  Hemoglobin 7.6 midnight 1/17, 7.3 this AM. Plt 68 this AM. No evidence of acute GI bleed on CTAP 1/16.  Likely secondary to ischemic colitis per GI --GI recommendations as follows:  -- Avoid endoscopic procedure given risks outweighing benefits  --Monitor hemoglobin  Q8-12 hours -- Heme Onc   --Will plan to contact [facility] oncologist tomorrow for further recommendations  --Platelet transfusion threshold <20 or uncontrolled bleeding per in house heme/onc provider consult --Appreciate ongoing palliative recommendations, next family meeting planned for 1/19 --Continue tube feeds per RD recommendation  -- Continue IV Protonix  40 mg Daily  -- Pain control: Tylenol , Oxycodone  -- PT/OT eval and treat --Fall and delirium precautions -- AM CBC --1600 H&H Hematuria 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  Unable to collect accurate UA on admission due to significant hematuria. No signs of infection currently -- Appreciate ongoing urology recommendations --further catheter irrigation and possible voiding trial today Dialysis dependent AKI Likely ESRD however nephrology monitoring for signs of renal recovery. UOP overnight. 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  Was on IV meropenem  and linezolid  at OSH, discontinued per ID as patient had completed sufficient course for indication and likely contribution to thrombocytopenia. --Risks of surgical debridement outweigh benefit per general surgery, signed off --IV antibiotics discontinued per ID as above --Turn patient over 2 hours while in bed, pressure relief every 15 minutes when in chair  --Wound care per [facility] consult note 1/16 -- PT/OT eval and treat Atrial fibrillation (HCC) No anticoagulation given high risk of bleed  -- Continue home diltiazem  90mg  and metoprolol  50mg  BID Severe protein-calorie malnutrition PEG (percutaneous endoscopic gastrostomy) status (HCC) Underweight (BMI < 18.5) PEG feeds recommendations per RD - Continue TF via G-tube  - Starting Jevity 1.5 at 25 ml/hr, advance by 10ml every 4 hrs to goal of 65 ml/hr ( 1,560 ml per day)  - 60 ml ProSource TF BID - Flush 30mL q2hr -MVI tablet daily - Juven BID - Strict NPO diet - checking micronutrient labs : Vitamin A , vitamin C, zinc  Chronic health problem H/o metabolic encephalopathy and insomnia: continue home sertraline  50mg  per tube daily; holding home trazodone  100mg  daily Hypotension:  continue home midodrine  10mg  per tube TID  Hypothyroidism: continue home levothyroxine  25mcg per tube daily Neuropathy: continue gabapentin  100mg  per tube QHS   FEN/GI: Tube feeds as above PPx: Holding due to risk of bleed Dispo: Pending clinical improvement  Subjective:  Patient resting comfortably in bed, daughter at bedside with no questions/concerns at this time.  Objective: Temp:  [97.7 F (36.5 C)-99.5 F (37.5 C)] 97.7 F (36.5 C) (01/18 0420) Pulse Rate:  [73-101] 83 (01/18 0420) Resp:  [15-30] 17 (01/18 0420) BP: (102-127)/(44-63) 107/44 (01/18 0420) SpO2:  [93 %-100 %] 99 % (01/18 0420) Weight:  [55.9 kg] 55.9 kg (01/17 1437) Physical Exam: General: Awake and alert, in no acute distress Cardiovascular: RRR, normal S1/S2, no M/R/G Respiratory: CTAB, normal WB on RA Abdomen: Normoactive bowel sounds, soft, nontender, nondistended Extremities: No edema to BLE  Laboratory: Most recent CBC Lab Results  Component Value Date   WBC 7.9 01/04/2025   HGB 7.3 (L) 01/04/2025   HCT 21.8 (L) 01/04/2025   MCV 91.6 01/04/2025   PLT 68 (L) 01/04/2025   Most recent BMP    Latest Ref Rng & Units 01/04/2025    4:13 AM  BMP  Glucose 70 - 99 mg/dL 887   BUN 8 - 23 mg/dL 44   Creatinine 9.38 - 1.24 mg/dL 8.21   Sodium 864 - 854 mmol/L 133   Potassium 3.5 - 5.1 mmol/L 4.1   Chloride 98 - 111 mmol/L 95   CO2 22 - 32 mmol/L 25   Calcium  8.9 - 10.3 mg/dL 9.0      Adele Song, MD 01/04/2025, 6:40 AM  PGY-2, [facility] FPTS Intern pager: [phone], text pages welcome Secure chat group [facility] Teaching Service   "

## 2025-01-04 NOTE — Assessment & Plan Note (Addendum)
 Received 2u pRBC, 1u pRBC in ED. Last transfusion 01/02/25.  Hemoglobin 7.6 midnight 1/17, 7.3 this AM. Plt 68 this AM. No evidence of acute GI bleed on CTAP 1/16.  Likely secondary to ischemic colitis per GI --GI recommendations as follows:  -- Avoid endoscopic procedure given risks outweighing benefits  --Monitor hemoglobin  Q8-12 hours -- Heme Onc   --Will plan to contact [facility] oncologist tomorrow for further recommendations  --Platelet transfusion threshold <20 or uncontrolled bleeding per in house heme/onc provider consult --Appreciate ongoing palliative recommendations, next family meeting planned for 1/19 --Continue tube feeds per RD recommendation  -- Continue IV Protonix  40 mg Daily  -- Pain control: Tylenol , Oxycodone  -- PT/OT eval and treat --Fall and delirium precautions -- AM CBC --1600 H&H

## 2025-01-04 NOTE — Progress Notes (Signed)
 Foley removed per Dr Watt without difficulty. Moderate amount of blood clots noted upon removal. Male external cath applied. Will continue to monitor.

## 2025-01-04 NOTE — Progress Notes (Signed)
 "   Subjective: HE had of UOP recorded overnight.  The urine in the foley tubing is  pink.   From the [place] records. Favorable intermediate risk prostate cancer - Dx 03/2016 at [facility] via prostate bx - Stage IIA ([id number]) Gleason grade 3+4, intermediate risk. - s/p radical prostatectomy 09/2016 and XRT 03/2017 for positive margin  - Last PSA 12/2023 0.04   ROS:  Review of Systems  Unable to perform ROS: Other    Anti-infectives: Anti-infectives (From admission, onward)    Start     Dose/Rate Route Frequency Ordered Stop   01/03/25 1000  meropenem  (MERREM ) 500 mg in sodium chloride  0.9 % 100 mL IVPB  Status:  Discontinued        500 mg 200 mL/hr over 30 Minutes Intravenous Every 24 hours 01/02/25 1128 01/02/25 1136   01/02/25 2200  meropenem  (MERREM ) 1 g in sodium chloride  0.9 % 100 mL IVPB  Status:  Discontinued        1 g 200 mL/hr over 30 Minutes Intravenous Every 12 hours 01/02/25 1049 01/02/25 1128   01/02/25 1000  meropenem  (MERREM ) 500 mg in sodium chloride  0.9 % 100 mL IVPB  Status:  Discontinued        500 mg 200 mL/hr over 30 Minutes Intravenous Daily 01/01/25 2214 01/02/25 1049   01/01/25 2215  meropenem  (MERREM ) 500 mg in sodium chloride  0.9 % 100 mL IVPB  Status:  Discontinued        500 mg 200 mL/hr over 30 Minutes Intravenous Daily 01/01/25 2153 01/01/25 2203   01/01/25 2200  linezolid  (ZYVOX ) IVPB 600 mg  Status:  Discontinued        600 mg 300 mL/hr over 60 Minutes Intravenous Every 12 hours 01/01/25 2153 01/02/25 1136       Current Facility-Administered Medications  Medication Dose Route Frequency Provider Last Rate Last Admin   acetaminophen  (TYLENOL ) tablet 1,000 mg  1,000 mg Per Tube Q6H PRN Lupie Credit, DO   1,000 mg at 01/03/25 0406   Or   acetaminophen  (TYLENOL ) suppository 650 mg  650 mg Rectal Q6H PRN Lupie Credit, DO       Chlorhexidine  Gluconate Cloth 2 % PADS 6 each  6 each Topical Q0600 Dennise Hoes, MD   6 each at 01/04/25 0537   diltiazem   (CARDIZEM ) tablet 90 mg  90 mg Per Tube Q8H Majeed, Sara, DO   90 mg at 01/04/25 0536   feeding supplement (JEVITY 1.5 CAL/FIBER) liquid 1,000 mL  1,000 mL Per Tube Continuous Pray, Margaret E, MD 65 mL/hr at 01/04/25 0538 Rate Change at 01/04/25 0538   feeding supplement (PROSource TF20) liquid 60 mL  60 mL Per Tube BID Pray, Margaret E, MD   60 mL at 01/03/25 2138   free water  30 mL  30 mL Per Tube Q2H Majeed, Sara, DO   30 mL at 01/04/25 0537   gabapentin  (NEURONTIN ) 250 MG/5ML solution 100 mg  100 mg Per Tube QHS Lupie Credit, DO   100 mg at 01/03/25 2138   latanoprost  (XALATAN ) 0.005 % ophthalmic solution 1 drop  1 drop Both Eyes QHS Majeed, Sara, DO   1 drop at 01/03/25 2139   levothyroxine  (SYNTHROID ) tablet 25 mcg  25 mcg Per Tube Q0600 Lupie Credit, DO   25 mcg at 01/04/25 0536   metoprolol  tartrate (LOPRESSOR ) tablet 50 mg  50 mg Per Tube BID Lupie Credit, DO   50 mg at 01/03/25 2138   midodrine  (PROAMATINE ) tablet  10 mg  10 mg Per Tube TID WC Majeed, Sara, DO   10 mg at 01/03/25 1245   multivitamin (RENA-VIT) tablet 1 tablet  1 tablet Per Tube QHS Donzetta Rollene BRAVO, MD   1 tablet at 01/03/25 2138   nutrition supplement (JUVEN) (JUVEN) powder packet 1 packet  1 packet Per Tube BID BM Donzetta Rollene BRAVO, MD   1 packet at 01/03/25 9060   Oral care mouth rinse  15 mL Mouth Rinse 4 times per day Donzetta Rollene BRAVO, MD   15 mL at 01/03/25 2140   Oral care mouth rinse  15 mL Mouth Rinse PRN Donzetta Rollene BRAVO, MD       oxyCODONE  (Oxy IR/ROXICODONE ) immediate release tablet 5 mg  5 mg Per Tube Q6H PRN Wonda, Josseline P, PA-C   5 mg at 01/03/25 1300   pantoprazole  (PROTONIX ) injection 40 mg  40 mg Intravenous Q24H Lupie Credit, DO   40 mg at 01/04/25 0010   sertraline  (ZOLOFT ) tablet 50 mg  50 mg Per Tube Daily Lupie Credit, DO   50 mg at 01/03/25 0743     Objective: Vital signs in last 24 hours: Temp:  [97.7 F (36.5 C)-99.5 F (37.5 C)] 97.7 F (36.5 C) (01/18 0420) Pulse Rate:  [79-101] 83  (01/18 0420) Resp:  [15-30] 17 (01/18 0420) BP: (102-127)/(44-63) 107/44 (01/18 0420) SpO2:  [93 %-100 %] 99 % (01/18 0420) Weight:  [55.9 kg] 55.9 kg (01/17 1437)  Intake/Output from previous day: 01/17 0701 - 01/18 0700 In: 986 [NG/GT:916] Out: 1400 [Urine:400] Intake/Output this shift: No intake/output data recorded.   Physical Exam Vitals reviewed.  Genitourinary:    Comments: He has a mild paraphimosis with edema.  I reduced that successfully.  There is minimally pink urine in the tubing.  Neurological:     Mental Status: He is alert.     Lab Results:  Recent Labs    01/03/25 0518 01/03/25 2240 01/04/25 0412  WBC 8.4  --  7.9  HGB 8.4*  8.4* 7.6* 7.3*  HCT 24.8*  24.6* 22.8* 21.8*  PLT 71*  --  68*   BMET Recent Labs    01/03/25 0519 01/04/25 0413  NA 133* 133*  K 4.5 4.1  CL 95* 95*  CO2 24 25  GLUCOSE 83 112*  BUN 60* 44*  CREATININE 2.65* 1.78*  CALCIUM  9.4 9.0   PT/INR Recent Labs    01/03/25 0518  LABPROT 21.4*  INR 1.8*   ABG No results for input(s): PHART, HCO3 in the last 72 hours.  Invalid input(s): PCO2, PO2  Studies/Results: CT ABDOMEN PELVIS WO CONTRAST Result Date: 01/02/2025 EXAM: CT ABDOMEN AND PELVIS WITHOUT CONTRAST 01/02/2025 12:07:35 PM TECHNIQUE: CT of the abdomen and pelvis was performed without the administration of intravenous contrast. Multiplanar reformatted images are provided for review. Automated exposure control, iterative reconstruction, and/or weight-based adjustment of the mA/kV was utilized to reduce the radiation dose to as low as reasonably achievable. COMPARISON: 12/08/2024 CLINICAL HISTORY: Lower GI bleed. FINDINGS: LOWER CHEST: Moderate right and small left pleural effusion with passive atelectasis. The catheter terminates in the right atrium. Low density blood pool suggests anemia. LIVER: The liver is unremarkable. GALLBLADDER AND BILE DUCTS: Gallbladder wall thickening versus small amount of  pericholecystic fluid. Gallstones noted. No biliary ductal dilatation. SPLEEN: Splenomegaly. Similar appearance of 3 cm lesion inferiorly in the spleen. PANCREAS: No acute abnormality. ADRENAL GLANDS: No acute abnormality. KIDNEYS, URETERS AND BLADDER: Mild right hydronephrosis and moderate right hydroureter.  No definite ureteral stone seen. Ureteral margins indistinct distally. Stable left renal cyst. No further imaging workup is needed for this cyst. Mild to moderate left hydronephrosis and moderate left hydroureter. A Foley catheter is embedded in high density material in the lumen of the urinary bladder which was not present on 12/08/2024 and presumably represents a blood product/hematoma. Small amount of gas noted in the urinary bladder. No stones in the kidneys. No perinephric or periureteral stranding. GI AND BOWEL: Stomach demonstrates no acute abnormality. PEG (Percutaneous Endoscopic Gastrostomy) tube noted. No dilated bowel noted. Possible bowel wall thickening in the lower rectum with continued presacral edema. Cannot exclude wall thickening in the ascending colon and hepatic flexure, although the appearance could be due to peristaltic activity. There is no bowel obstruction. PERITONEUM AND RETROPERITONEUM: Suspected mild to moderate ascites. No free air. Diffuse mesenteric edema noted. VASCULATURE: Systemic atherosclerosis is present, including the aorta and iliac arteries. LYMPH NODES: No lymphadenopathy. REPRODUCTIVE ORGANS: No acute abnormality. BONES AND SOFT TISSUES: 5.1 x 2.3 cm complex fluid collection along the right iliacus muscle on image 64 series 3, previously 6.0 x 3.5 cm on 12/08/2024. This could be hematoma or abscess. Large sacral decubitus ulcer with chronic bony destructive findings along the S5 segment of the sacrum and sacrococcygeal junction, and a 1.2 cm gap between the lower margin of the sacrum (which may be exposed to the atmosphere) and the remaining coccyx on image 63 series  7. Lower lumbar spondylosis and degenerative disc disease. Mild subcutaneous edema. IMPRESSION: 1. Possible bowel wall thickening in the lower rectum with presacral edema, and cannot exclude wall thickening in the ascending colon and hepatic flexure. 2. Low density blood pool, which can be seen with anemia. 3. Foley catheter embedded in high-density material within the urinary bladder, presumably blood product or hematoma, new from prior, with a small amount of intravesical gas. 4. Mild right hydronephrosis with moderate right hydroureter and mild to moderate left hydronephrosis with moderate left hydroureter, without a definite ureteral stone. 5. 5.1 x 2.3 cm complex fluid collection along the right iliacus muscle, which could represent hematoma or abscess, decreased in size compared to prior. 6. Large sacral decubitus ulcer with chronic bony destructive change at the S5 segment and sacrococcygeal junction, with a 1.2 cm gap between the lower sacrum and remaining coccyx. 7. Moderate right and small left pleural effusions with passive atelectasis. 8. Gallstones with gallbladder wall thickening versus a small amount of pericholecystic fluid. 9. Suspected mild to moderate ascites with diffuse mesenteric edema and mild subcutaneous edema. 10. Splenomegaly with a similar 3 cm inferior splenic lesion. 11. Systemic atherosclerosis including the aorta and iliac arteries. 12. Lower lumbar spondylosis and degenerative disc disease. Electronically signed by: Ryan Salvage MD 01/02/2025 12:50 PM EST RP Workstation: [id number]   Labs reviewed.     Assessment and Plan: Gross hematuria with a history of UTI's and prostate cancer with prior prostatectomy and salvage radiation therapy.   His urine is still pink.  His Hgb is down but he isn't having enough hematuria to account for the decline.   Paraphimosis.  I have reduced that.   I think it would be best to D/C the foley at this time.      LOS: 3 days    Enio Hornback 01/04/2025 "

## 2025-01-04 NOTE — Assessment & Plan Note (Addendum)
 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  Unable to collect accurate UA on admission due to significant hematuria. No signs of infection currently -- Appreciate ongoing urology recommendations --further catheter irrigation and possible voiding trial today

## 2025-01-04 NOTE — Plan of Care (Signed)
  Problem: Education: Goal: Ability to identify signs and symptoms of gastrointestinal bleeding will improve Outcome: Completed/Met

## 2025-01-04 NOTE — Assessment & Plan Note (Addendum)
 H/o metabolic encephalopathy and insomnia: continue home sertraline  50mg  per tube daily; holding home trazodone  100mg  daily Hypotension: continue home midodrine  10mg  per tube TID Hypothyroidism: continue home levothyroxine  per tube daily Neuropathy: continue gabapentin  100mg  per tube QHS

## 2025-01-05 DIAGNOSIS — M8660 Other chronic osteomyelitis, unspecified site: Secondary | ICD-10-CM | POA: Diagnosis not present

## 2025-01-05 DIAGNOSIS — Z515 Encounter for palliative care: Secondary | ICD-10-CM | POA: Diagnosis not present

## 2025-01-05 DIAGNOSIS — D62 Acute posthemorrhagic anemia: Secondary | ICD-10-CM | POA: Diagnosis not present

## 2025-01-05 DIAGNOSIS — L89150 Pressure ulcer of sacral region, unstageable: Secondary | ICD-10-CM | POA: Diagnosis not present

## 2025-01-05 DIAGNOSIS — K559 Vascular disorder of intestine, unspecified: Secondary | ICD-10-CM | POA: Insufficient documentation

## 2025-01-05 DIAGNOSIS — Z7189 Other specified counseling: Secondary | ICD-10-CM | POA: Diagnosis not present

## 2025-01-05 DIAGNOSIS — N186 End stage renal disease: Secondary | ICD-10-CM | POA: Diagnosis not present

## 2025-01-05 DIAGNOSIS — R532 Functional quadriplegia: Secondary | ICD-10-CM | POA: Diagnosis not present

## 2025-01-05 DIAGNOSIS — E43 Unspecified severe protein-calorie malnutrition: Secondary | ICD-10-CM | POA: Diagnosis not present

## 2025-01-05 DIAGNOSIS — Z992 Dependence on renal dialysis: Secondary | ICD-10-CM | POA: Diagnosis not present

## 2025-01-05 DIAGNOSIS — K922 Gastrointestinal hemorrhage, unspecified: Secondary | ICD-10-CM | POA: Diagnosis not present

## 2025-01-05 DIAGNOSIS — D649 Anemia, unspecified: Secondary | ICD-10-CM | POA: Diagnosis not present

## 2025-01-05 LAB — TYPE AND SCREEN
ABO/RH(D): O NEG
Antibody Screen: NEGATIVE
Unit division: 0
Unit division: 0
Unit division: 0

## 2025-01-05 LAB — CBC
HCT: 18.6 % — ABNORMAL LOW (ref 39.0–52.0)
Hemoglobin: 6.2 g/dL — CL (ref 13.0–17.0)
MCH: 31.2 pg (ref 26.0–34.0)
MCHC: 33.3 g/dL (ref 30.0–36.0)
MCV: 93.5 fL (ref 80.0–100.0)
Platelets: 64 K/uL — ABNORMAL LOW (ref 150–400)
RBC: 1.99 MIL/uL — ABNORMAL LOW (ref 4.22–5.81)
RDW: 15.9 % — ABNORMAL HIGH (ref 11.5–15.5)
WBC: 7.6 K/uL (ref 4.0–10.5)
nRBC: 0.7 % — ABNORMAL HIGH (ref 0.0–0.2)

## 2025-01-05 LAB — GLUCOSE, CAPILLARY
Glucose-Capillary: 112 mg/dL — ABNORMAL HIGH (ref 70–99)
Glucose-Capillary: 112 mg/dL — ABNORMAL HIGH (ref 70–99)
Glucose-Capillary: 116 mg/dL — ABNORMAL HIGH (ref 70–99)
Glucose-Capillary: 118 mg/dL — ABNORMAL HIGH (ref 70–99)
Glucose-Capillary: 123 mg/dL — ABNORMAL HIGH (ref 70–99)
Glucose-Capillary: 124 mg/dL — ABNORMAL HIGH (ref 70–99)

## 2025-01-05 LAB — RENAL FUNCTION PANEL
Albumin: 2.7 g/dL — ABNORMAL LOW (ref 3.5–5.0)
Anion gap: 10 (ref 5–15)
BUN: 90 mg/dL — ABNORMAL HIGH (ref 8–23)
CO2: 29 mmol/L (ref 22–32)
Calcium: 9 mg/dL (ref 8.9–10.3)
Chloride: 94 mmol/L — ABNORMAL LOW (ref 98–111)
Creatinine, Ser: 2.57 mg/dL — ABNORMAL HIGH (ref 0.61–1.24)
GFR, Estimated: 26 mL/min — ABNORMAL LOW
Glucose, Bld: 118 mg/dL — ABNORMAL HIGH (ref 70–99)
Phosphorus: 2.7 mg/dL (ref 2.5–4.6)
Potassium: 4.7 mmol/L (ref 3.5–5.1)
Sodium: 132 mmol/L — ABNORMAL LOW (ref 135–145)

## 2025-01-05 LAB — PHOSPHORUS: Phosphorus: 2.7 mg/dL (ref 2.5–4.6)

## 2025-01-05 LAB — BPAM RBC
Blood Product Expiration Date: 202602022359
Blood Product Expiration Date: 202602152359
Blood Product Expiration Date: 202602162359
ISSUE DATE / TIME: 202601152006
Unit Type and Rh: 9500
Unit Type and Rh: 9500
Unit Type and Rh: 9500

## 2025-01-05 LAB — MAGNESIUM: Magnesium: 2.1 mg/dL (ref 1.7–2.4)

## 2025-01-05 LAB — VITAMIN A: Vitamin A (Retinoic Acid): 45 ug/dL (ref 22.0–69.5)

## 2025-01-05 LAB — ZINC: Zinc: 300 ug/dL — ABNORMAL HIGH (ref 44–115)

## 2025-01-05 LAB — PREPARE RBC (CROSSMATCH)

## 2025-01-05 LAB — HEMOGLOBIN AND HEMATOCRIT, BLOOD
HCT: 21.1 % — ABNORMAL LOW (ref 39.0–52.0)
HCT: 19.4 % — ABNORMAL LOW (ref 39.0–52.0)
Hemoglobin: 7 g/dL — ABNORMAL LOW (ref 13.0–17.0)
Hemoglobin: 6.4 g/dL — CL (ref 13.0–17.0)

## 2025-01-05 MED ORDER — CHLORHEXIDINE GLUCONATE CLOTH 2 % EX PADS
6.0000 | MEDICATED_PAD | Freq: Every day | CUTANEOUS | Status: DC
  Administered 2025-01-06 – 2025-01-07 (×2): 6 via TOPICAL

## 2025-01-05 MED ORDER — SODIUM CHLORIDE 0.9% IV SOLUTION: Freq: Once | INTRAVENOUS | Status: AC

## 2025-01-05 MED ORDER — ACETAMINOPHEN 500 MG PO TABS
1000.0000 mg | ORAL_TABLET | Freq: Four times a day (QID) | ORAL | Status: DC
  Administered 2025-01-05 – 2025-01-11 (×20): 1000 mg
  Filled 2025-01-05 (×23): qty 2

## 2025-01-05 MED ORDER — PANTOPRAZOLE SODIUM 40 MG IV SOLR
40.0000 mg | Freq: Two times a day (BID) | INTRAVENOUS | Status: DC
  Administered 2025-01-05 – 2025-01-10 (×11): 40 mg via INTRAVENOUS
  Filled 2025-01-05 (×11): qty 10

## 2025-01-05 MED ORDER — OXYCODONE HCL 5 MG PO TABS
5.0000 mg | ORAL_TABLET | Freq: Four times a day (QID) | ORAL | Status: DC
  Administered 2025-01-05 – 2025-01-07 (×8): 5 mg
  Filled 2025-01-05 (×8): qty 1

## 2025-01-05 MED ORDER — ACETAMINOPHEN 650 MG RE SUPP: 650.0000 mg | Freq: Four times a day (QID) | RECTAL | Status: DC

## 2025-01-05 NOTE — Progress Notes (Signed)
" ° °  °  Subjective: Ricardo [name]. Pt nonverbal. Purewick and collection cannister full of frank blood and clot.   Objective: Vital signs in last 24 hours: Temp:  [97.8 F (36.6 C)-98.8 F (37.1 C)] 97.8 F (36.6 C) (01/19 0718) Pulse Rate:  [80-91] 81 (01/19 0718) Resp:  [16-20] 18 (01/19 0718) BP: (109-124)/(39-46) 109/43 (01/19 0718) SpO2:  [98 %-100 %] 100 % (01/19 0718)  Assessment/Plan: #radiation cystitis # Clot retention of urine  22f hematuria Foley placed by Dr. Watt on 01/02/2025 with large volume of clot material hand irrigated.  Patient continued to improve over the weekend and patient underwent voiding trial 01/04/2025.  On rounds late morning patient was found to have a few 100 cc of frank blood in the PureWick canister with a notable amount of clot material collected within the PureWick.  Returned with materials and placed 59f hematuria coud on 01/05/2025.  Hand irrigated to clear with 100 mL of sterile water .  Third port plugged.  Nursing to hand irrigate every 4.  Urology will follow  Intake/Output from previous day: 01/18 0701 - 01/19 0700 In: 1299.6 [I.V.:8; NG/GT:1291.6] Out: 325 [Urine:325]  Intake/Output this shift: Total I/O In: 346.2 [Blood:286.2; NG/GT:60] Out: -   Physical Exam:  General: TA.  Patient nonverbal CV: No cyanosis Lungs: equal chest rise Gu: 13f hematuria coud in place draining light pink irrigant/urine.  Third port plugged  Lab Results: Recent Labs    01/04/25 1554 01/05/25 0333 01/05/25 1041  HGB 7.3* 6.2* 7.0*  HCT 21.8* 18.6* 21.1*   BMET Recent Labs    01/04/25 0412 01/04/25 0413 01/04/25 1554 01/05/25 0333 01/05/25 1041  NA  --  133*  --  132*  --   K  --  4.1  --  4.7  --   CL  --  95*  --  94*  --   CO2  --  25  --  29  --   GLUCOSE  --  112*  --  118*  --   BUN  --  44*  --  90*  --   CREATININE  --  1.78*  --  2.57*  --   CALCIUM   --  9.0  --  9.0  --   HGB 7.3*  --    < > 6.2* 7.0*  WBC 7.9  --   --  7.6  --     < > = values in this interval not displayed.     Studies/Results: No results found.    LOS: 4 days   Ole Bourdon, NP [facility] Pager: [phone]  01/05/2025, 3:18 PM  "

## 2025-01-05 NOTE — NC FL2 (Signed)
 " [place]  MEDICAID FL2 LEVEL OF CARE FORM     IDENTIFICATION  Patient Name: Ricardo [name] Birthdate: Dec 17, 1951 Sex: male Admission Date (Current Location): 01/01/2025  [facility] and [place] Number:  Producer, Television/film/video and Address:  [facility] [place], [address] [place], [place] [address]      Provider Number: [id number]  Attending Physician Name and Address:  Donzetta Rollene BRAVO, MD  Relative Name and Phone Number:  GILLIS, BOARDLEY  Daughter, Emergency Contact  [phone]    Current Level of Care: Hospital Recommended Level of Care: Skilled Nursing Facility Prior Approval Number:    Date Approved/Denied:   PASRR Number: [id number]  Discharge Plan: SNF    Current Diagnoses: Patient Active Problem List   Diagnosis Date Noted   Ischemic colitis 01/05/2025   Anemia 01/03/2025   Lower GI bleed 01/03/2025   Underweight (BMI < 18.5) 01/02/2025   Acute blood loss anemia 01/02/2025   Urethral erosion by catheter 01/02/2025   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) 01/02/2025   Pressure ulcer of sacral region, unstageable (HCC) 01/02/2025   GI bleed 01/01/2025   Chronic health problem 01/01/2025   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   Hematuria 01/01/2025   Atrial fibrillation (HCC) 01/01/2025   Chronic osteomyelitis (HCC) 01/01/2025   Severe protein-calorie malnutrition 01/01/2025   PEG (percutaneous endoscopic gastrostomy) status (HCC) 01/01/2025   Symptomatic anemia 07/03/2024   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   Iron deficiency anemia due to chronic blood loss 07/03/2024   Depression 07/03/2024   GERD (gastroesophageal reflux disease) 07/03/2024   Prostate cancer (HCC) 02/21/2017   Swelling of joint of hand 08/07/2011   Snuff user 08/07/2011   Gout, unspecified 04/14/2008    Orientation RESPIRATION BLADDER Height & Weight     Self  Normal Incontinent Weight: 123 lb 3.8 oz (55.9 kg) Height:  6'  (182.9 cm)  BEHAVIORAL SYMPTOMS/MOOD NEUROLOGICAL BOWEL NUTRITION STATUS      Incontinent Diet (see dc summary)  AMBULATORY STATUS COMMUNICATION OF NEEDS Skin   Total Care   Other (Comment) (pressure injury sacral)                       Personal Care Assistance Level of Assistance  Bathing, Feeding, Dressing Bathing Assistance: Maximum assistance Feeding assistance: Limited assistance Dressing Assistance: Maximum assistance     Functional Limitations Info  Sight, Speech, Hearing Sight Info: Adequate Hearing Info: Adequate Speech Info: Impaired    SPECIAL CARE FACTORS FREQUENCY  PT (By licensed PT), OT (By licensed OT)     PT Frequency: 5x/wk OT Frequency: 5x/wk            Contractures Contractures Info: Not present    Additional Factors Info  Code Status, Allergies Code Status Info: DNR Allergies Info: NKA           Current Medications (01/05/2025):  This is the current hospital active medication list Current Facility-Administered Medications  Medication Dose Route Frequency Provider Last Rate Last Admin   acetaminophen  (TYLENOL ) tablet 1,000 mg  1,000 mg Per Tube Q6H Majeed, Camie, DO       Or   acetaminophen  (TYLENOL ) suppository 650 mg  650 mg Rectal Q6H Majeed, Sara, DO       [START ON 01/06/2025] Chlorhexidine  Gluconate Cloth 2 % PADS 6 each  6 each Topical Q0600 Lenon Charmaine BRAVO, NP       diltiazem  (CARDIZEM ) tablet 90 mg  90 mg Per  Tube Q8H Majeed, Sara, DO   90 mg at 01/05/25 0505   feeding supplement (JEVITY 1.5 CAL/FIBER) liquid 1,000 mL  1,000 mL Per Tube Continuous Pray, Margaret E, MD 65 mL/hr at 01/05/25 0502 1,000 mL at 01/05/25 0502   feeding supplement (PROSource TF20) liquid 60 mL  60 mL Per Tube BID Donzetta Rollene BRAVO, MD   60 mL at 01/05/25 0909   free water  30 mL  30 mL Per Tube Q2H Majeed, Sara, DO   30 mL at 01/05/25 0925   gabapentin  (NEURONTIN ) 250 MG/5ML solution 100 mg  100 mg Per Tube QHS Lupie Credit, DO   100 mg at 01/04/25 2119    latanoprost  (XALATAN ) 0.005 % ophthalmic solution 1 drop  1 drop Both Eyes QHS Majeed, Sara, DO   1 drop at 01/04/25 2121   levothyroxine  (SYNTHROID ) tablet 25 mcg  25 mcg Per Tube Q0600 Lupie Credit, DO   25 mcg at 01/05/25 0505   metoprolol  tartrate (LOPRESSOR ) tablet 50 mg  50 mg Per Tube BID Lupie Credit, DO   50 mg at 01/05/25 9090   midodrine  (PROAMATINE ) tablet 10 mg  10 mg Per Tube TID WC Majeed, Sara, DO   10 mg at 01/05/25 0859   multivitamin (RENA-VIT) tablet 1 tablet  1 tablet Per Tube QHS Donzetta Rollene BRAVO, MD   1 tablet at 01/04/25 2119   nutrition supplement (JUVEN) (JUVEN) powder packet 1 packet  1 packet Per Tube BID BM Pray, Margaret E, MD   1 packet at 01/05/25 9090   Oral care mouth rinse  15 mL Mouth Rinse 4 times per day Pray, Margaret E, MD   15 mL at 01/04/25 2121   Oral care mouth rinse  15 mL Mouth Rinse PRN Donzetta Rollene BRAVO, MD       oxyCODONE  (Oxy IR/ROXICODONE ) immediate release tablet 5 mg  5 mg Per Tube Q6H Majeed, Sara, DO       pantoprazole  (PROTONIX ) injection 40 mg  40 mg Intravenous Q12H Donzetta Rollene BRAVO, MD   40 mg at 01/05/25 9075   sertraline  (ZOLOFT ) tablet 50 mg  50 mg Per Tube Daily Lupie Credit, DO   50 mg at 01/05/25 0909     Discharge Medications: Please see discharge summary for a list of discharge medications.  Relevant Imaging Results:  Relevant Lab Results:   Additional Information SSN: [id number], [facility],  Lendia Dais, LCSWA     "

## 2025-01-05 NOTE — Assessment & Plan Note (Addendum)
 PEG feeds recommendations per RD - Continue TF via G-tube  - Starting Jevity 1.5 at 25 ml/hr, advance by 10ml every 4 hrs to goal of 65 ml/hr ( 1,560 ml per day)  - 60 ml ProSource TF BID - Flush 30mL q2hr -MVI tablet daily - Juven BID - Strict NPO  - Micronutrient labs : Vitamin A , vitamin C, zinc 

## 2025-01-05 NOTE — Assessment & Plan Note (Deleted)
 Received 2u pRBC, 1u pRBC in ED. Last transfusion 01/02/25.  Hemoglobin 7.6 midnight 1/17, 7.3 this AM. Plt 68 this AM. No evidence of acute GI bleed on CTAP 1/16.  Likely secondary to ischemic colitis per GI --GI recommendations as follows:  -- Avoid endoscopic procedure given risks outweighing benefits  --Monitor hemoglobin  Q8-12 hours -- Heme Onc   --Will plan to contact [facility] oncologist tomorrow for further recommendations  --Platelet transfusion threshold <20 or uncontrolled bleeding per in house heme/onc provider consult --Appreciate ongoing palliative recommendations, next family meeting planned for 1/19 --Continue tube feeds per RD recommendation  -- Continue IV Protonix  40 mg Daily  -- Pain control: Tylenol , Oxycodone  -- PT/OT eval and treat --Fall and delirium precautions -- AM CBC --1600 H&H

## 2025-01-05 NOTE — Plan of Care (Signed)
   Problem: Education: Goal: Knowledge of General Education information will improve Description: Including pain rating scale, medication(s)/side effects and non-pharmacologic comfort measures Outcome: Completed/Met

## 2025-01-05 NOTE — TOC Progression Note (Addendum)
 Transition of Care [facility]) - Progression Note    Patient Details  Name: Ricardo [name] MRN: [id number] Date of Birth: [birth year]  [facility] CM/SW Contact  Lendia Dais, [place] Phone Number: 01/05/2025, 3:18 PM  Clinical Narrative: Pt is oriented to self only and from [facility] but unable to return d/t the pt being out of Medicare days through their insurance and not meeting LTACH requirement.  CSW spoke to the pt's daughter via phone and informed her of the information above. [name] stated understanding and was agreeable to referrals being sent out to SNF. [name] requested for the search to broaden and was agreeable to referrals being sent out in the [place], [place], [place], [place], [place], and [place] point area. Daughter stays in [place].  Pt would have to be clipped for HD. Per Nephrology, pt is not yet medically appropriate for outpt HD due to debility and not being able to sit in a chair due to sacral wounds.    CSW has sent out referrals in the HUB. CSW will continue to monitor.                       Expected Discharge Plan and Services                                               Social Drivers of Health (SDOH) Interventions SDOH Screenings   Food Insecurity: No Food Insecurity (01/02/2025)  Housing: Low Risk (01/02/2025)  Transportation Needs: No Transportation Needs (01/02/2025)  Utilities: Not At Risk (01/02/2025)  Financial Resource Strain: Low Risk  (10/02/2024)   Received from [facility]  Social Connections: Patient Unable To Answer (01/02/2025)  Stress: No Stress Concern Present (10/01/2024)   Received from [facility]  Recent Concern: Stress - Stress Concern Present (08/03/2024)   Received from [facility]  Tobacco Use: High Risk (01/02/2025)    Readmission Risk Interventions    07/08/2024   10:16 AM 07/07/2024   11:05 AM  Readmission Risk Prevention Plan  Transportation  Screening Complete Complete  Home Care Screening Complete Complete  Medication Review (RN CM) Complete Complete

## 2025-01-05 NOTE — Assessment & Plan Note (Addendum)
 Severe sacral pressure ulcer with osteomyelitis, present on admission. Was on IV meropenem  and linezolid  at OSH, discontinued per ID as patient had completed sufficient course for indication and likely contribution to thrombocytopenia. -- Risks of surgical debridement outweigh benefit per general surgery, signed off -- Turn patient over 2 hours while in bed, pressure relief every 15 minutes when in chair  -- Wound care per [facility] consult note 1/16 -- PT/OT eval and treat

## 2025-01-05 NOTE — Progress Notes (Addendum)
 " [facility] Progress Note   Subjective:    Seen and examined patient in room. Opens eyes to voice with minimal response. Appears comfortable. S/p 1 unit PRBCs for Hgb 6.2. Hgb is now 7.  Objective Vitals:   01/05/25 0403 01/05/25 0453 01/05/25 0510 01/05/25 0718  BP: (!) 120/39 (!) 124/42 (!) 124/41 (!) 109/43  Pulse: 86 91 85 81  Resp: 17 20  18   Temp: 98.2 F (36.8 C) 98.8 F (37.1 C) 98.4 F (36.9 C) 97.8 F (36.6 C)  TempSrc: Oral Oral Oral   SpO2: 100% 99% 99% 100%  Weight:      Height:       Physical Exam General: chronically ill appearing, very frail male in NAD Heart: RRR, no murmurs Lungs: CTA bilaterally, respirations unlabored Abdomen: Soft and non-distended Extremities: No edema b/l lower extremities Dialysis Access:  [facility]  Filed Weights   01/01/25 1821 01/03/25 0453 01/03/25 1437  Weight: 59 kg 59.2 kg 55.9 kg    Intake/Output Summary (Last 24 hours) at 01/05/2025 1228 Last data filed at 01/05/2025 0925 Gross per 24 hour  Intake 1525.78 ml  Output 325 ml  Net 1200.78 ml    Additional Objective Labs: Basic Metabolic Panel: Recent Labs  Lab 01/03/25 0519 01/04/25 0412 01/04/25 0413 01/05/25 0333  NA 133*  --  133* 132*  K 4.5  --  4.1 4.7  CL 95*  --  95* 94*  CO2 24  --  25 29  GLUCOSE 83  --  112* 118*  BUN 60*  --  44* 90*  CREATININE 2.65*  --  1.78* 2.57*  CALCIUM  9.4  --  9.0 9.0  PHOS 4.9* 2.6 2.6 2.7  2.7   Liver Function Tests: Recent Labs  Lab 12/30/24 2000 01/01/25 0405 01/01/25 1849 01/02/25 0130 01/04/25 0412 01/04/25 0413 01/05/25 0333  AST 52*  --  54*  --  48*  --   --   ALT 36  --  32  --  26  --   --   ALKPHOS 378*  --  327*  --  300*  --   --   BILITOT 0.4  --  0.5  --  0.5  --   --   PROT 7.8  --  7.4  --  7.2  --   --   ALBUMIN 2.7*   < > 2.5*   < > 2.6* 2.6* 2.7*   < > = values in this interval not displayed.   No results for input(s): LIPASE, AMYLASE in the last 168  hours. CBC: Recent Labs  Lab 01/01/25 0405 01/01/25 1849 01/01/25 1857 01/02/25 0130 01/02/25 0927 01/03/25 0518 01/03/25 2240 01/04/25 0412 01/04/25 1554 01/05/25 0333 01/05/25 1041  WBC 7.1 10.7*  --  8.6  --  8.4  --  7.9  --  7.6  --   NEUTROABS 4.6 8.7*  --   --   --   --   --  5.3  --   --   --   HGB 5.5* 8.0*   < > 8.8*   < > 8.4*  8.4*   < > 7.3* 7.3* 6.2* 7.0*  HCT 16.6* 24.3*   < > 25.2*   < > 24.8*  24.6*   < > 21.8* 21.8* 18.6* 21.1*  MCV 94.3 94.2  --  89.0  --  90.5  --  91.6  --  93.5  --   PLT 73* 74*  --  67*  --  71*  --  68*  --  64*  --    < > = values in this interval not displayed.   Blood Culture    Component Value Date/Time   SDES BLOOD RIGHT HAND 12/10/2024 1438   SPECREQUEST  12/10/2024 1438    BOTTLES DRAWN AEROBIC ONLY Blood Culture adequate volume   CULT  12/10/2024 1438    NO GROWTH 5 DAYS Performed at [facility], [address], [place], [place] [address]    REPTSTATUS 12/15/2024 FINAL 12/10/2024 1438    Cardiac Enzymes: No results for input(s): CKTOTAL, CKMB, CKMBINDEX, TROPONINI in the last 168 hours. CBG: Recent Labs  Lab 01/04/25 2004 01/05/25 0047 01/05/25 0406 01/05/25 0719 01/05/25 1111  GLUCAP 129* 123* 112* 124* 112*   Iron Studies: No results for input(s): IRON, TIBC, TRANSFERRIN, FERRITIN in the last 72 hours. Lab Results  Component Value Date   INR 1.8 (H) 01/03/2025   INR 1.5 (H) 11/11/2024   INR 1.3 (H) 07/03/2024   Studies/Results: No results found.  Medications:  feeding supplement (JEVITY 1.5 CAL/FIBER) 1,000 mL (01/05/25 0502)    Chlorhexidine  Gluconate Cloth  6 each Topical Q0600   diltiazem   90 mg Per Tube Q8H   feeding supplement (PROSource TF20)  60 mL Per Tube BID   free water   30 mL Per Tube Q2H   gabapentin   100 mg Per Tube QHS   latanoprost   1 drop Both Eyes QHS   levothyroxine   25 mcg Per Tube Q0600   metoprolol  tartrate  50 mg Per Tube BID   midodrine   10 mg Per  Tube TID WC   multivitamin  1 tablet Per Tube QHS   nutrition supplement (JUVEN)  1 packet Per Tube BID BM   mouth rinse  15 mL Mouth Rinse 4 times per day   pantoprazole  (PROTONIX ) IV  40 mg Intravenous Q12H   sertraline   50 mg Per Tube Daily    Assessment/Plan:  Dialysis dependent AKI  -has been on renal replacement therapy since August 2025 (since [facility] to [facility]). Has been doing HD TTS at [facility] -may be deemed as ESRD, let's watch for any semblence of renal recovery in the interim before deeming as ESRD therefore please monitor strict I/O & daily labs. Some UOP but not enough to stop dialysis at this time.  -HD on TTS schedule, next HD 1/20   Anemia of Chronic Kidney Disease ABLA, GI Bleed Transfusion dependent myelofibrosis -transfuse prn for hgb <7 -S/p 1 unit PRBCs today for Hgb 6.2. Hgb now is 7 -GI following--conservative mgmt for now   Hematuria -intermittent issue, h/o prostate Ca per chart -s/p irrigation with urology 1/16   Chronic osteomyelitis -on meropenem  and linezolid   until 01/31/24 -per primary   Severe protein-calorie malnutirion -with PEG -per primary   Secondary hyperparathyroidism -phos and calcium  at goal   GOC -palliative following  Dispo -Monitoring for renal recovering but more likely ESRD -I am concerned he is not an ideal candidate for outpatient dialysis given function and frailty.  -See above  Charmaine Piety, NP [facility] 01/05/2025,12:28 PM  LOS: 4 days    "

## 2025-01-05 NOTE — Assessment & Plan Note (Addendum)
 Anticoagulation inappropriate given high risk of bleed  -- Continue home diltiazem  90mg  and metoprolol  50mg  BID

## 2025-01-05 NOTE — Assessment & Plan Note (Addendum)
 No evidence of acute GI bleed on CTAP 1/16, likely secondary to ischemic colitis per GI. --GI recommendations as follows:  -- Avoid endoscopic procedure given risks outweighing benefits  -- Monitor hemoglobin q8-12 hours -- Heme Onc   --Will plan to contact [facility] onc[facility] 01/20 for further recommendations (closed 01/19 for holiday) --Platelet transfusion threshold <20 or uncontrolled bleeding per in house heme/onc provider consult -- Appreciate ongoing palliative recommendations, next family meeting planned for 1/19 -- Continue tube feeds per RD recommendation  -- Continue IV Protonix  40 mg Daily  -- Pain control: Tylenol , oxycodone  scheduled -- Fall and delirium precautions

## 2025-01-05 NOTE — Progress Notes (Signed)
"   Latest Reference Range & Units 01/05/25 03:33  Hemoglobin 13.0 - 17.0 g/dL 6.2 (LL)  MD on call made aware.  "

## 2025-01-05 NOTE — Assessment & Plan Note (Addendum)
 Likely ESRD however nephrology monitoring for signs of renal recovery. --Recommendations per nephrology:  --Continue TTS HD schedule  --Daily RFP, Mg  --Strict I&O -- Avoid nephrotoxic medications

## 2025-01-05 NOTE — Procedures (Signed)
" ° °  Procedure: insert indwelling catheter bedside   Urology Procedure Note:  Patient was prepped and draped in the usual sterile fashion.  10 mL of viscous lidocaine  lubricant was injected directly into the urethra.  After adequate time for anesthetic effect of been provided, a 68f three-way hematuria coud catheter was advanced to the bladder with minimal resistance.  There was immediate return of medium red clear urine.  Having confirmed placement, retention balloon was inflated with 30 mL of sterile water .  Hand irrigated to clear with the 100 mL of sterile water  and catheter was put to gravity drainage.  Retention strap affixed to the left upper thigh.  This concluded the procedure.   Ole Bourdon, NP [facility] Pager: [phone]  "

## 2025-01-05 NOTE — Progress Notes (Signed)
 "    Daily Progress Note Intern Pager: [phone]  Patient name: Ricardo [name] Medical record number: [id number] Date of birth: March 15, 1951 Age: 74 y.o. Gender: male  Primary Care Provider: [facility] [place] Consultants: GI, urology, palliative Code Status: DNR/DNI  Pt Overview and Major Events to Date:  01/15: Admitted for GI bleed  Assessment and Plan:  Ricardo [name] is a 74 year old male admitted for GI bleed.  He was transferred from [facility], where he has resided for 3 months due to sepsis complicated by sacral pressure ulcer and osteomyelitis, E. coli peritonitis and encephalopathy. Hospital course remains complicated by persistent GI and GU bleeding requiring transfusions. Appreciate goals of care discussions with Palliative and daughter.  Assessment & Plan Primary myelofibrosis (HCC) Ischemic colitis No evidence of acute GI bleed on CTAP 1/16, likely secondary to ischemic colitis per GI. --GI recommendations as follows:  -- Avoid endoscopic procedure given risks outweighing benefits  -- Monitor hemoglobin q8-12 hours -- Heme Onc   --Will plan to contact [facility]'s [facility] hemat[facility] oncologist 01/20 for further recommendations (closed 01/19 for holiday) --Platelet transfusion threshold <20 or uncontrolled bleeding per in house heme/onc provider consult -- Appreciate ongoing palliative recommendations, next family meeting planned for 1/19 -- Continue tube feeds per RD recommendation  -- Continue IV Protonix  40 mg Daily  -- Pain control: Tylenol , oxycodone  scheduled -- Fall and delirium precautions Hematuria 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  Unable to collect accurate UA on admission due to significant hematuria. No signs of infection currently. -- Appreciate ongoing urology recommendations -- urology d/c foley 01/18 -- paraphimosis reduced 01/18 -- further irrigation as appropriate Dialysis dependent  AKI Likely ESRD however nephrology monitoring for signs of renal recovery. 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 Was on IV meropenem  and linezolid  at OSH, discontinued per ID as patient had completed sufficient course for indication and likely contribution to thrombocytopenia. -- Risks of surgical debridement outweigh benefit per general surgery, signed off -- Turn patient over 2 hours while in bed, pressure relief every 15 minutes when in chair  -- Wound care per WOC consult note 1/16 -- PT/OT eval and treat Atrial fibrillation (HCC) Anticoagulation inappropriate given high risk of bleed  -- Continue home diltiazem  90mg  and metoprolol  50mg  BID Severe protein-calorie malnutrition PEG (percutaneous endoscopic gastrostomy) status (HCC) Underweight (BMI < 18.5) PEG feeds recommendations per RD - Continue TF via G-tube  - Starting Jevity 1.5 at 25 ml/hr, advance by 10ml every 4 hrs to goal of 65 ml/hr ( 1,560 ml per day)  - 60 ml ProSource TF BID - Flush 30mL q2hr -MVI tablet daily - Juven BID - Strict NPO  - Micronutrient labs : Vitamin A , vitamin C, zinc  Chronic health problem H/o metabolic encephalopathy and insomnia: continue home sertraline  50mg  per tube daily; holding home trazodone  100mg  daily Hypotension: continue home midodrine  10mg  per tube TID Hypothyroidism: continue home levothyroxine  25mcg per tube daily Neuropathy: continue gabapentin  100mg  per tube QHS  FEN/GI: tube feeds PPx: n/a Dispo:Pending Palliative discussions with daughter.  Subjective:  Patient was seen and examined at bedside. He remains bedridden and non-verbal. I am able to illicit answers to a few  y/n questions. Otherwise seems comfortable.   Objective:  Temp:  [97.8 F (36.6 C)-99.3 F (37.4 C)] 97.8 F (36.6 C) (01/19 0718) Pulse Rate:  [80-91] 81 (01/19 0718) Resp:  [16-20] 18 (01/19 0718) BP: (109-124)/(39-46) 109/43 (01/19 0718) SpO2:  [96 %-100 %] 100 % (01/19 0718) Physical Exam: General: cachectic, chronically ill-appearing male lying supine in hospital bed, in no acute distress Eyes: Nonicteric, EOMI ENTM: Dry mucous membranes, partially edentulous without top teeth Cardiovascular: Distant heart sounds, 2+ radial pulses Respiratory: tachypneic, no w/r/r Gastrointestinal: Soft, nondistended, mild TTP diffusely, bowel sounds present, G tube in place MSK: Obvious muscle wasting Derm: No rashes Neuro: Able to grunt y/n to questions  Laboratory: Most recent CBC Lab Results  Component Value Date   WBC 7.6 01/05/2025   HGB 6.2 (LL) 01/05/2025   HCT 18.6 (L) 01/05/2025   MCV 93.5 01/05/2025   PLT 64 (L) 01/05/2025   Most recent BMP    Latest Ref Rng & Units 01/05/2025    3:33 AM  BMP  Glucose 70 - 99 mg/dL 881   BUN 8 - 23 mg/dL 90   Creatinine 9.38 - 1.24 mg/dL 7.42   Sodium 864 - 854 mmol/L 132   Potassium 3.5 - 5.1 mmol/L 4.7   Chloride 98 - 111 mmol/L 94   CO2 22 - 32 mmol/L 29   Calcium  8.9 - 10.3 mg/dL 9.0     Lupie Credit, DO 01/05/2025, 8:00 AM  PGY-1, [facility] FPTS Intern pager: [phone], text pages welcome Secure chat group [facility] Teaching Service   "

## 2025-01-05 NOTE — Assessment & Plan Note (Addendum)
 H/o metabolic encephalopathy and insomnia: continue home sertraline  50mg  per tube daily; holding home trazodone  100mg  daily Hypotension: continue home midodrine  10mg  per tube TID Hypothyroidism: continue home levothyroxine  per tube daily Neuropathy: continue gabapentin  100mg  per tube QHS

## 2025-01-05 NOTE — Progress Notes (Signed)
 "                                                                                                                                                         Daily Progress Note   Patient Name: Ricardo [name]       Date: 01/05/2025 DOB: 1951-08-04  Age: 74 y.o. MRN#: [id number] Attending Physician: Donzetta Rollene BRAVO, MD Primary Care Physician: [facility] [place] Admit Date: 01/01/2025  Reason for Consultation/Follow-up: Establishing goals of care  Subjective: Patient was receiving personal care from nursing staff. No visitors present.  Called patient's daughter [name] for ongoing GOC discussions and palliative support. She has been appreciative of communication from the care team since admission.  She now has the answers she was wanting from surgery and urology. We discussed the weekend events, including reduction of paraphimosis, patient now on pure wick, plan for primary team to reach out to [facility] heme/onc tomorrow.  She tells me that patient's goals of care remain the same as last week, praying and hoping that patient can improve further while acknowledging that patient's wound is quite severe.    We also reviewed her recent conversation with case manager today.  She is dedicated to her father's care and willing to travel to any SNF.  Palliative care will be preferred, as patient has not yet voiced his readiness for hospice.  I shared that I will continue my attempts to discuss with him at a better time throughout the week and she is appreciative of the support.  Questions and concerns addressed. PMT will continue to support holistically.   Objective: Medical records reviewed including most recent family medicine progress notes, urology progress notes, nephrology, TOC.  Referenced several of Dr. Maynard notes in particular during conversation with patient's daughter about hematuria.  Reviewed surgery consult note 1/16.  Surgical debridement of eschar would likely create larger wound and more harm than  benefit.  Discussed with primary team after conversation with family.  Physical Exam Vitals and nursing note reviewed.  Constitutional:      General: He is not in acute distress.    Appearance: He is ill-appearing.  HENT:     Head: Normocephalic and atraumatic.  Cardiovascular:     Rate and Rhythm: Normal rate.  Pulmonary:     Effort: Pulmonary effort is normal.  Neurological:     Mental Status: He is alert. Mental status is at baseline.  Psychiatric:        Behavior: Behavior normal.            Vital Signs: BP (!) 109/43 (BP Location: Right Arm)   Pulse 81   Temp 97.8 F (36.6 C)   Resp 18   Ht 6' (1.829 m)   Wt 55.9 kg   SpO2 100%   BMI 16.71 kg/m  SpO2:  SpO2: 100 % O2 Device: O2 Device: Room Air O2 Flow Rate:        Palliative Assessment/Data: 30% (on tube feeds)   Palliative Care Assessment & Plan   Patient Profile: 74 y.o. 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    [facility] cared for since prolonged admission at [facility] and discharge on 10/01/2024.  He was seen several times by inpatient palliative team there.  Patient's daughter reports concern with ongoing hematuria and insufficient wound care despite her continued attempts to advocate for patient's needs.  In the ED, he received PRBC, also received 2 units in dialysis.  GI consulted and suspect bleeding is from ischemic colitis/hypotension.   PMT has been consulted to assist with goals of care conversation. After initial discussion, decision was made to proceed with hospitalization and escalate to ICU if indicated, with NO tracheostomy as a limitation.  Follow up on 1/19 to ensure care plan remains within patient and family's previously stated goals on 1/16.  Assessment: Goals of care conversation  Chronic osteomyelitis and sacral wounds Debility and deconditioning Myelofibrosis Hematuria  Recommendations/Plan: Continue DNR/DNI Continue current care plan. Goals of care are clear to treat the treatable and family is hopeful for improvement, open to SNF placement at another facility per insurance coverage Ongoing GOC discussions and palliative support   Prognosis:  Unable to determine  Discharge Planning: Skilled Nursing Facility for rehab with Palliative care service follow-up  Care plan was discussed with RN, patient's daughter, FMTS providers         Leemon Ayala SHAUNNA Fell, PA-C  Palliative Medicine Team Team phone # [phone]  Thank you for allowing the Palliative Medicine Team to assist in the care of this patient. Please utilize secure chat with additional questions, if there is no response within 30 minutes please call the above phone number.  Palliative Medicine Team providers are available by phone from 7am to 7pm daily and can be reached through the team cell phone.  Should this patient require assistance outside of these hours, please call the patient's attending physician.   I personally spent a total of 25 minutes in the care of the patient today including preparing to see the patient, getting/reviewing separately obtained history, performing a medically appropriate exam/evaluation, counseling and educating, referring and communicating with other health care professionals, and documenting clinical information in the EHR.  "

## 2025-01-05 NOTE — TOC CM/SW Note (Signed)
 Transition of Care [facility]) - Inpatient Brief Assessment   Patient Details  Name: Ricardo [name] MRN: [id number] Date of Birth: May 21, 1951  [facility] CM/SW Contact:    Tom-Johnson, Harvest Muskrat, RN Phone Number: 01/05/2025, 3:21 PM   Clinical Narrative:  Patient presented to the ED from [facility] with Anemia and bright red blood per Rectum. Admitted with GI Bleed.  CM reached out to [facility] and spoke with Kristeen, Liaison at [facility]. Kristeen states patient is currently out of his LTACH Medicare days and his secondary insurance does not work for [employer]. Ciera reached out to the [facility] and was informed [facility] and cannot provide [facility] coverage. SNF is recommended when Medically stable for discharge.   CM will continue to follow with home needs.     Transition of Care Asessment:

## 2025-01-05 NOTE — Assessment & Plan Note (Signed)
 No evidence of acute GI bleed on CTAP 1/16, likely secondary to ischemic colitis per GI. --GI recommendations as follows:  -- Avoid endoscopic procedure given risks outweighing benefits  -- Monitor hemoglobin q8-12 hours -- Heme Onc   --Will plan to contact [facility]'s [facility] hemat[facility] oncologist 01/20 for further recommendations (closed 01/19 for holiday) --Platelet transfusion threshold <20 or uncontrolled bleeding per in house heme/onc provider consult -- Appreciate ongoing palliative recommendations, next family meeting planned for 1/19 -- Continue tube feeds per RD recommendation  -- Continue IV Protonix  40 mg Daily  -- Pain control: Tylenol , oxycodone  scheduled -- Fall and delirium precautions

## 2025-01-05 NOTE — Assessment & Plan Note (Addendum)
 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  Unable to collect accurate UA on admission due to significant hematuria. No signs of infection currently. -- Appreciate ongoing urology recommendations -- urology d/c foley 01/18 -- paraphimosis reduced 01/18 -- further irrigation as appropriate

## 2025-01-06 DIAGNOSIS — K922 Gastrointestinal hemorrhage, unspecified: Secondary | ICD-10-CM | POA: Diagnosis not present

## 2025-01-06 LAB — RENAL FUNCTION PANEL
Albumin: 2.4 g/dL — ABNORMAL LOW (ref 3.5–5.0)
Anion gap: 13 (ref 5–15)
BUN: 119 mg/dL — ABNORMAL HIGH (ref 8–23)
CO2: 25 mmol/L (ref 22–32)
Calcium: 8.9 mg/dL (ref 8.9–10.3)
Chloride: 93 mmol/L — ABNORMAL LOW (ref 98–111)
Creatinine, Ser: 3.11 mg/dL — ABNORMAL HIGH (ref 0.61–1.24)
GFR, Estimated: 21 mL/min — ABNORMAL LOW
Glucose, Bld: 125 mg/dL — ABNORMAL HIGH (ref 70–99)
Phosphorus: 3.4 mg/dL (ref 2.5–4.6)
Potassium: 5.2 mmol/L — ABNORMAL HIGH (ref 3.5–5.1)
Sodium: 131 mmol/L — ABNORMAL LOW (ref 135–145)

## 2025-01-06 LAB — GLUCOSE, CAPILLARY
Glucose-Capillary: 109 mg/dL — ABNORMAL HIGH (ref 70–99)
Glucose-Capillary: 113 mg/dL — ABNORMAL HIGH (ref 70–99)
Glucose-Capillary: 115 mg/dL — ABNORMAL HIGH (ref 70–99)
Glucose-Capillary: 140 mg/dL — ABNORMAL HIGH (ref 70–99)
Glucose-Capillary: 143 mg/dL — ABNORMAL HIGH (ref 70–99)

## 2025-01-06 LAB — HEMOGLOBIN AND HEMATOCRIT, BLOOD
HCT: 20.8 % — ABNORMAL LOW (ref 39.0–52.0)
Hemoglobin: 7 g/dL — ABNORMAL LOW (ref 13.0–17.0)

## 2025-01-06 LAB — CBC
HCT: 22.7 % — ABNORMAL LOW (ref 39.0–52.0)
Hemoglobin: 7.7 g/dL — ABNORMAL LOW (ref 13.0–17.0)
MCH: 30.2 pg (ref 26.0–34.0)
MCHC: 33.9 g/dL (ref 30.0–36.0)
MCV: 89 fL (ref 80.0–100.0)
Platelets: 50 K/uL — ABNORMAL LOW (ref 150–400)
RBC: 2.55 MIL/uL — ABNORMAL LOW (ref 4.22–5.81)
RDW: 17.4 % — ABNORMAL HIGH (ref 11.5–15.5)
WBC: 8.4 K/uL (ref 4.0–10.5)
nRBC: 0.5 % — ABNORMAL HIGH (ref 0.0–0.2)

## 2025-01-06 LAB — MAGNESIUM: Magnesium: 2.1 mg/dL (ref 1.7–2.4)

## 2025-01-06 LAB — VITAMIN C: Vitamin C: 0.3 mg/dL — ABNORMAL LOW (ref 0.4–2.0)

## 2025-01-06 MED ORDER — SODIUM CHLORIDE 0.9 % IR SOLN
3000.0000 mL | Status: DC
  Administered 2025-01-06 – 2025-01-10 (×12): 3000 mL

## 2025-01-06 MED ORDER — ALTEPLASE 2 MG IJ SOLR: 2.0000 mg | Freq: Once | INTRAMUSCULAR | Status: DC | PRN

## 2025-01-06 MED ORDER — LIDOCAINE HCL (PF) 1 % IJ SOLN: 5.0000 mL | INTRAMUSCULAR | Status: DC | PRN

## 2025-01-06 MED ORDER — METOPROLOL TARTRATE 12.5 MG HALF TABLET
12.5000 mg | ORAL_TABLET | Freq: Two times a day (BID) | ORAL | Status: DC
  Administered 2025-01-06 – 2025-01-10 (×8): 12.5 mg
  Filled 2025-01-06 (×8): qty 1

## 2025-01-06 MED ORDER — PENTAFLUOROPROP-TETRAFLUOROETH EX AERO: 1.0000 | INHALATION_SPRAY | CUTANEOUS | Status: DC | PRN

## 2025-01-06 MED ORDER — LIDOCAINE-PRILOCAINE 2.5-2.5 % EX CREA: 1.0000 | TOPICAL_CREAM | CUTANEOUS | Status: DC | PRN

## 2025-01-06 MED ORDER — HEPARIN SODIUM (PORCINE) 1000 UNIT/ML DIALYSIS
1000.0000 [IU] | INTRAMUSCULAR | Status: DC | PRN
  Administered 2025-01-06: 3800 [IU] via INTRAVENOUS_CENTRAL

## 2025-01-06 MED ORDER — HEPARIN SODIUM (PORCINE) 1000 UNIT/ML IJ SOLN
INTRAMUSCULAR | Status: AC
  Filled 2025-01-06: qty 4

## 2025-01-06 NOTE — Assessment & Plan Note (Signed)
 Likely ESRD however nephrology monitoring for signs of renal recovery. --Recommendations per nephrology:  --Continue TTS HD schedule  --Daily RFP, Mg  --Strict I&O -- Avoid nephrotoxic medications

## 2025-01-06 NOTE — Progress Notes (Signed)
" ° °  °  Subjective: Ricardo [name]. Pt nonverbal. In dialysis.  Urine in foley tube is clear red without clots.   Objective: Vital signs in last 24 hours: Temp:  [97.6 F (36.4 C)-99.3 F (37.4 C)] 97.8 F (36.6 C) (01/20 0845) Pulse Rate:  [66-83] 83 (01/20 0901) Resp:  [18-30] 23 (01/20 0901) BP: (101-118)/(38-53) 113/53 (01/20 0901) SpO2:  [91 %-100 %] 93 % (01/20 0901)  Assessment/Plan: #radiation cystitis # Clot retention of urine  He continues to have hematuria without clots.   Could consider IV TXA if medically appropriate.  If the bleeding continues and he is felt to be medically fit for anesthesia, we could consider cystoscopy with fulguration.   Urology will follow  Intake/Output from previous day: 01/19 0701 - 01/20 0700 In: 2686.8 [I.V.:7.3; Blood:617; NG/GT:1882.4] Out: 450 [Urine:450]  Intake/Output this shift: Total I/O In: 30 [NG/GT:30] Out: -   Physical Exam:  General: TA.  Patient nonverbal CV: No cyanosis Lungs: equal chest rise Gu: 71f hematuria coud in place draining clear red urine.  Third port plugged  Lab Results: Recent Labs    01/05/25 1041 01/05/25 2145 01/06/25 0324  HGB 7.0* 6.4* 7.7*  HCT 21.1* 19.4* 22.7*   BMET Recent Labs    01/05/25 0333 01/05/25 1041 01/05/25 2145 01/06/25 0324 01/06/25 0326  NA 132*  --   --   --  131*  K 4.7  --   --   --  5.2*  CL 94*  --   --   --  93*  CO2 29  --   --   --  25  GLUCOSE 118*  --   --   --  125*  BUN 90*  --   --   --  119*  CREATININE 2.57*  --   --   --  3.11*  CALCIUM  9.0  --   --   --  8.9  HGB 6.2*   < > 6.4* 7.7*  --   WBC 7.6  --   --  8.4  --    < > = values in this interval not displayed.     Studies/Results: No results found.     LOS: 5 days   Ole Bourdon, NP [facility] Pager: [phone]  01/06/2025, 9:25 AM  "

## 2025-01-06 NOTE — Progress Notes (Signed)
 "  [facility] Progress Note   Subjective:    Seen and examined patient on HD. Eyes open but nonverbal. No UF with today's treatment. BP is 115/44. Urology following for hematuria. S/p foley catheter placement by Urology. S/p another unit PRBCs overnight. Hgb now is 7.7.  Objective Vitals:   01/06/25 0845 01/06/25 0901 01/06/25 0937 01/06/25 1007  BP: (!) 113/44 (!) 113/53 (!) 100/50 (!) 97/46  Pulse: 83 83 87 82  Resp: (!) 30 (!) 23 18 (!) 21  Temp: 97.8 F (36.6 C)     TempSrc:      SpO2: 91% 93% 95% 94%  Weight:      Height:       Physical Exam General: chronically ill appearing, very frail male in NAD Heart: RRR, no murmurs Lungs: Clear anteriorly, respirations unlabored Abdomen: Soft and non-distended Extremities: No edema b/l lower extremities Dialysis Access:  [facility]  Filed Weights   01/01/25 1821 01/03/25 0453 01/03/25 1437  Weight: 59 kg 59.2 kg 55.9 kg    Intake/Output Summary (Last 24 hours) at 01/06/2025 1013 Last data filed at 01/06/2025 0803 Gross per 24 hour  Intake 2370.58 ml  Output 450 ml  Net 1920.58 ml    Additional Objective Labs: Basic Metabolic Panel: Recent Labs  Lab 01/04/25 0413 01/05/25 0333 01/06/25 0326  NA 133* 132* 131*  K 4.1 4.7 5.2*  CL 95* 94* 93*  CO2 25 29 25   GLUCOSE 112* 118* 125*  BUN 44* 90* 119*  CREATININE 1.78* 2.57* 3.11*  CALCIUM  9.0 9.0 8.9  PHOS 2.6 2.7  2.7 3.4   Liver Function Tests: Recent Labs  Lab 12/30/24 2000 01/01/25 0405 01/01/25 1849 01/02/25 0130 01/04/25 0412 01/04/25 0413 01/05/25 0333 01/06/25 0326  AST 52*  --  54*  --  48*  --   --   --   ALT 36  --  32  --  26  --   --   --   ALKPHOS 378*  --  327*  --  300*  --   --   --   BILITOT 0.4  --  0.5  --  0.5  --   --   --   PROT 7.8  --  7.4  --  7.2  --   --   --   ALBUMIN 2.7*   < > 2.5*   < > 2.6* 2.6* 2.7* 2.4*   < > = values in this interval not displayed.   No results for input(s): LIPASE, AMYLASE in the last  168 hours. CBC: Recent Labs  Lab 01/01/25 0405 01/01/25 1849 01/01/25 1857 01/02/25 0130 01/02/25 0927 01/03/25 0518 01/03/25 2240 01/04/25 0412 01/04/25 1554 01/05/25 0333 01/05/25 1041 01/05/25 2145 01/06/25 0324  WBC 7.1 10.7*  --  8.6  --  8.4  --  7.9  --  7.6  --   --  8.4  NEUTROABS 4.6 8.7*  --   --   --   --   --  5.3  --   --   --   --   --   HGB 5.5* 8.0*   < > 8.8*   < > 8.4*  8.4*   < > 7.3*   < > 6.2* 7.0* 6.4* 7.7*  HCT 16.6* 24.3*   < > 25.2*   < > 24.8*  24.6*   < > 21.8*   < > 18.6* 21.1* 19.4* 22.7*  MCV 94.3 94.2  --  89.0  --  90.5  --  91.6  --  93.5  --   --  89.0  PLT 73* 74*  --  67*  --  71*  --  68*  --  64*  --   --  50*   < > = values in this interval not displayed.   Blood Culture    Component Value Date/Time   SDES BLOOD RIGHT HAND 12/10/2024 1438   SPECREQUEST  12/10/2024 1438    BOTTLES DRAWN AEROBIC ONLY Blood Culture adequate volume   CULT  12/10/2024 1438    NO GROWTH 5 DAYS Performed at [facility], [address] [place], [place] [address]    REPTSTATUS 12/15/2024 FINAL 12/10/2024 1438    Cardiac Enzymes: No results for input(s): CKTOTAL, CKMB, CKMBINDEX, TROPONINI in the last 168 hours. CBG: Recent Labs  Lab 01/05/25 1624 01/05/25 2040 01/06/25 0018 01/06/25 0415 01/06/25 0717  GLUCAP 116* 118* 115* 143* 109*   Iron Studies: No results for input(s): IRON, TIBC, TRANSFERRIN, FERRITIN in the last 72 hours. Lab Results  Component Value Date   INR 1.8 (H) 01/03/2025   INR 1.5 (H) 11/11/2024   INR 1.3 (H) 07/03/2024   Studies/Results: No results found.  Medications:  feeding supplement (JEVITY 1.5 CAL/FIBER) 1,000 mL (01/05/25 2115)    acetaminophen   1,000 mg Per Tube Q6H   Or   acetaminophen   650 mg Rectal Q6H   Chlorhexidine  Gluconate Cloth  6 each Topical Q0600   diltiazem   90 mg Per Tube Q8H   feeding supplement (PROSource TF20)  60 mL Per Tube BID   free water   30 mL Per Tube Q2H    gabapentin   100 mg Per Tube QHS   latanoprost   1 drop Both Eyes QHS   levothyroxine   25 mcg Per Tube Q0600   metoprolol  tartrate  12.5 mg Per Tube BID   midodrine   10 mg Per Tube TID WC   multivitamin  1 tablet Per Tube QHS   nutrition supplement (JUVEN)  1 packet Per Tube BID BM   mouth rinse  15 mL Mouth Rinse 4 times per day   oxyCODONE   5 mg Per Tube Q6H   pantoprazole  (PROTONIX ) IV  40 mg Intravenous Q12H   sertraline   50 mg Per Tube Daily    Assessment/Plan:  Dialysis dependent AKI  -has been on renal replacement therapy since August 2025 (since [facility] to [facility]). Has been doing HD TTS at [facility] -may be deemed as ESRD, let's watch for any semblence of renal recovery in the interim before deeming as ESRD therefore please monitor strict I/O & daily labs. Some UOP but not enough to stop dialysis at this time.  -HD on TTS schedule, on HD with no UF   Anemia of Chronic Kidney Disease ABLA, GI Bleed Transfusion dependent myelofibrosis -transfuse prn for hgb <7 -S/p 3 unit PRBCs today so far. Hgb now is 7.7 -GI following--conservative mgmt for now   Hematuria -intermittent issue, h/o prostate Ca per chart -urology following -s/p irrigation with urology 1/16 -s/p foley catheter placement today by Urology -per Urology may consider IV TXA or cystoscopy with fulguration   Chronic osteomyelitis -on meropenem  and linezolid   until 01/31/24 -per primary   Severe protein-calorie malnutirion -with PEG -per primary   Secondary hyperparathyroidism -phos and calcium  at goal   GOC -palliative following   Dispo -Monitoring for renal recovering but more likely ESRD -I am concerned he is not an ideal candidate for outpatient dialysis given  function and frailty.  -See above  Charmaine Piety, NP [facility] 01/06/2025,10:13 AM  LOS: 5 days    "

## 2025-01-06 NOTE — Progress Notes (Signed)
 "    Daily Progress Note Intern Pager: [phone]  Patient name: Ricardo [name] Medical record number: [id number] Date of birth: 12/09/1951 Age: 74 y.o. Gender: male  Primary Care Provider: [facility] [place] Consultants: GI, urology, palliative Code Status: DNR/DNI   Pt Overview and Major Events to Date:  01/15: Admitted for GI bleed   Assessment and Plan:   Ricardo [name] is a 74 year old male admitted for GI bleed.  He was transferred from [facility], where he has resided for 3 months due to sepsis complicated by sacral pressure ulcer and osteomyelitis, E. coli peritonitis and encephalopathy. Hospital course remains complicated by persistent GI and GU bleeding requiring transfusions. Appreciate goals of care discussions with Palliative and daughter.  Assessment & Plan Primary myelofibrosis (HCC) Ischemic colitis No evidence of acute GI bleed on CTAP 1/16, likely secondary to ischemic colitis per GI. Received 1u PRBC overnight for hgb of 6.4. 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  Unable to collect accurate UA on admission due to significant hematuria. No signs of infection currently. -- Appreciate ongoing urology recommendations -- urology d/c foley 01/18 -- paraphimosis reduced 01/18 -- 43f foley placed 01/20  -- Consider IV TXA  --  If bleeding continues and can be cleared for anesthesia, consider cystoscopy with fulguration -- further irrigation as appropriate Dialysis dependent AKI Likely ESRD however nephrology monitoring for signs of renal recovery. 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 Was on IV meropenem  and linezolid  at OSH, discontinued per ID as patient had completed sufficient course for indication and likely contribution to thrombocytopenia. -- Risks of surgical debridement outweigh benefit per general surgery, signed off -- Turn patient over 2 hours while in bed, pressure relief every 15 minutes when in chair  -- Wound care per [facility] consult note 1/16 -- PT/OT eval and treat Atrial fibrillation (HCC) Anticoagulation inappropriate given high risk of bleed. Unfortunately DBP dropping. Expect will need to gradually take off all BP/HR medications.  -- Decrease metoprolol  to 12.5mg  BID  -- Continue home diltiazem  90mg , hold if diastolic low Severe protein-calorie malnutrition PEG (percutaneous endoscopic gastrostomy) status (HCC) Underweight (BMI < 18.5) PEG feeds recommendations per RD - Continue TF via G-tube  - Starting Jevity 1.5 at 25 ml/hr, advance by 10ml every 4 hrs to goal of 65 ml/hr ( 1,560 ml per day)  - 60 ml ProSource TF BID - Flush 30mL q2hr -MVI tablet daily - Juven BID - Strict NPO  - Micronutrient labs : Vitamin A , vitamin C, zinc  Chronic health problem H/o metabolic encephalopathy and insomnia: continue home sertraline  50mg  per tube daily; holding home trazodone  100mg  daily Hypotension: continue home midodrine  10mg  per tube TID Hypothyroidism: continue home levothyroxine  25mcg per  tube daily  Neuropathy: continue gabapentin  100mg  per tube QHS  FEN/GI: NPO, per tube only PPx: n/a Dispo:  Subjective:  Patient was seen and examined at bedside. No clinical changes.   Objective: Temp:  [97.6 F (36.4 C)-98.7 F (37.1 C)] 98.3 F (36.8 C) (01/20 0411) Pulse Rate:  [66-81] 74 (01/20 0411) Resp:  [18-20] 18 (01/20 0411) BP: (101-118)/(40-47) 106/43 (01/20 0411) SpO2:  [95 %-100 %] 96 % (01/20 0411) Physical Exam: General: cachectic, chronically ill-appearing male lying supine in hospital bed, in no acute distress Eyes: Nonicteric, EOMI ENTM: Dry mucous membranes, partially edentulous without top teeth Cardiovascular: Distant heart sounds, 2+ radial pulses Respiratory: normal WOB on RA Gastrointestinal: nondistended, G tube in place MSK: Obvious muscle wasting Derm: No rashes Neuro: Able to grunt y/n to questions  Laboratory: Most recent CBC Lab Results  Component Value Date   WBC 8.4 01/06/2025   HGB 7.7 (L) 01/06/2025   HCT 22.7 (L) 01/06/2025   MCV 89.0 01/06/2025   PLT 50 (L) 01/06/2025   Most recent BMP    Latest Ref Rng & Units 01/06/2025    3:26 AM  BMP  Glucose 70 - 99 mg/dL 874   BUN 8 - 23 mg/dL 880   Creatinine 9.38 - 1.24 mg/dL 6.88   Sodium 864 - 854 mmol/L 131   Potassium 3.5 - 5.1 mmol/L 5.2   Chloride 98 - 111 mmol/L 93   CO2 22 - 32 mmol/L 25   Calcium  8.9 - 10.3 mg/dL 8.9     Lupie Credit, DO 01/06/2025, 7:09 AM  PGY-1, [facility] FPTS Intern pager: [phone], text pages welcome Secure chat group [facility] Teaching Service   "

## 2025-01-06 NOTE — Assessment & Plan Note (Addendum)
 Anticoagulation inappropriate given high risk of bleed. Unfortunately DBP dropping. Expect will need to gradually take off all BP/HR medications.  -- Decrease metoprolol  to 12.5mg  BID  -- Continue home diltiazem  90mg , hold if diastolic low

## 2025-01-06 NOTE — Assessment & Plan Note (Signed)
 Severe sacral pressure ulcer with osteomyelitis, present on admission. Was on IV meropenem  and linezolid  at OSH, discontinued per ID as patient had completed sufficient course for indication and likely contribution to thrombocytopenia. -- Risks of surgical debridement outweigh benefit per general surgery, signed off -- Turn patient over 2 hours while in bed, pressure relief every 15 minutes when in chair  -- Wound care per [facility] consult note 1/16 -- PT/OT eval and treat

## 2025-01-06 NOTE — Plan of Care (Signed)
 [facility] and [facility] multiple times before reaching heme/onc nursing line and had to LVM for heme/onc physician to call back at [phone].

## 2025-01-06 NOTE — Assessment & Plan Note (Signed)
 PEG feeds recommendations per RD - Continue TF via G-tube  - Starting Jevity 1.5 at 25 ml/hr, advance by 10ml every 4 hrs to goal of 65 ml/hr ( 1,560 ml per day)  - 60 ml ProSource TF BID - Flush 30mL q2hr -MVI tablet daily - Juven BID - Strict NPO  - Micronutrient labs : Vitamin A , vitamin C, zinc 

## 2025-01-06 NOTE — Assessment & Plan Note (Addendum)
 No evidence of acute GI bleed on CTAP 1/16, likely secondary to ischemic colitis per GI. Received 1u PRBC overnight for hgb of 6.4. --GI recommendations as follows:  -- Avoid endoscopic procedure given risks outweighing benefits  -- Monitor hemoglobin q8-12 hours -- Heme Onc   --Contacted [facility] 01/20 for further recommendations and LVM on nursing line for call back --Platelet transfusion threshold <20 or uncontrolled bleeding per in house heme/onc provider consult -- Appreciate ongoing palliative recommendations -- Continue tube feeds per RD recommendation  -- Continue IV Protonix  40 mg Daily  -- Pain control: Tylenol , oxycodone  scheduled -- Fall and delirium precautions

## 2025-01-06 NOTE — Plan of Care (Signed)
  Problem: Clinical Measurements: Goal: Diagnostic test results will improve Outcome: Completed/Met

## 2025-01-06 NOTE — Progress Notes (Signed)
 CBI started on patient urine already getting lighter pt tolerating as of now.

## 2025-01-06 NOTE — Assessment & Plan Note (Signed)
 H/o metabolic encephalopathy and insomnia: continue home sertraline  50mg  per tube daily; holding home trazodone  100mg  daily Hypotension: continue home midodrine  10mg  per tube TID Hypothyroidism: continue home levothyroxine  per tube daily Neuropathy: continue gabapentin  100mg  per tube QHS

## 2025-01-06 NOTE — Progress Notes (Signed)
 Navigator aware of pt's admission from [facility]. Pt's case discussed with nephrologist and CSW yesterday afternoon. Pt is not currently felt to be appropriate for out-pt HD at this time per nephrologist. Patients have to be able to tolerate HD in a chair for 4 hrs and account for the time to travel to/from HD clinic in a w/c. Will follow and assist with out-pt HD arrangements when pt deemed medically appropriate for out-pt HD.   Randine Mungo Dialysis Navigator [phone]

## 2025-01-06 NOTE — Progress Notes (Signed)
 PT Cancellation Note  Patient Details Name: Ricardo [name] MRN: [id number] DOB: August 23, 1951   Cancelled Treatment:    Reason Eval/Treat Not Completed: Patient at procedure or test/unavailable (Pt in HD.  Will return as able.)   Ricardo [name] Bevel 01/06/2025, 8:55 AM Ricardo [name] M,PT Acute Rehab Services [phone]

## 2025-01-06 NOTE — Progress Notes (Signed)
 Pt. Came in awake and oriented and complained of pain. Face grimacing. Consent verified and on file. Started with no complications  UF goal:32ml Tx duration: 3hours  Access used: Right CVC Access issue: None  Tx completed and tolerated Catheter locked with heparin . Endorsed to floor nurse Transported to room   Estanislao Auther Shope, RN Kidney Care Unit

## 2025-01-06 NOTE — Assessment & Plan Note (Addendum)
 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  Unable to collect accurate UA on admission due to significant hematuria. No signs of infection currently. -- Appreciate ongoing urology recommendations -- urology d/c foley 01/18 -- paraphimosis reduced 01/18 -- 87f foley placed 01/20  -- Consider IV TXA  -- If bleeding continues and can be cleared for anesthesia, consider cystoscopy with fulguration -- further irrigation as appropriate

## 2025-01-06 NOTE — Progress Notes (Addendum)
 Nutrition Follow-up  DOCUMENTATION CODES:   Severe malnutrition in context of chronic illness, Underweight  INTERVENTION:   Tube feeds via G-tube: Continue Jevity 1.5 at 65 mL/hr (1560 mL per day) 60 mL ProSource TF20 - BID Flush: 30 mL every 2 hours Regimen at goal provides 2500 calories, 140 gm protein, and 1546 mL free water  daily. Juven BID, each packet provides 95 calories, 2.5 grams of protein (collagen),  to support wound healing Renal Multivitamin w/ minerals daily Checking the following micronutrient labs given chronic wounds: Vitamin A , Vitamin C, Zinc , CRP  NUTRITION DIAGNOSIS:   Severe Malnutrition related to chronic illness as evidenced by severe muscle depletion, severe fat depletion.  GOAL:   Patient will meet greater than or equal to 90% of their needs   MONITOR:   Supplement acceptance, Skin, TF tolerance, Labs, I & O's, Weight trends  REASON FOR ASSESSMENT:   Consult Enteral/tube feeding initiation and management  ASSESSMENT:   74 y.o. male presented with rectal bleeding from [facility]. Pt with recent complex medical history from August 2025, with anuric renal failure, septic shock, enterococcus faecalis peritonitis, PEG placed on 10/1, chronic thrombocytopenia, and chronic wounds. PMH includes prostate cancer, GERD, anemia, gout, COPD, ESRD on HD, and depression. Pt admitted for GI bleed.   1/15 - Admitted  1/16 - CTAP; no evidence of GI bleed, likely secondary to colitis per GI   Last HD today, no UF. some UOP. Likely ESRD however nephrology monitoring for signs of renal recovery.   Pt with eyes open during assessment, minimal response, non verbal. Tolerating tube feeds at goal. Per RN BMs are improved, did have x2 type 7 BM yesterday and 1 small BM today. Changing formula from Nepro to Jevity likely helped slow Bms.   Minimal weight history within the past year in chart. Weight in July 2025 appears to be copied froward from previous  encounter in late 2024. If weight history is accurate pt has lost 30 lbs in unknown timeframe. Unknown EDW. On exam pt with severe muscle and fat wasting, meets criteria for severe malnutrition.   GI team with no plans for intervention at this time. Surgery consulted for possible debridement of sacral wound but are not recommending any surgical interventions at this time as risks outweigh the benefits.   Vitamin A  and zinc  labs resulted and reviewed with consideration of elevated CRP. Vitamin A  WNL, zinc  elevated. Awaiting Vitamin C result.  Dispo: SNF, per nephrology, not yet ready to be clippe dfor HD as unable to sit in chair d/t sacral wounds  Admission Weight: 59 kg - Stated?  Current Weight: 55.2 kg  Average Meal Intake: NPO  Nutritionally Relevant Medications: Scheduled Meds:  feeding supplement (PROSource TF20)  60 mL Per Tube BID   free water   30 mL Per Tube Q2H   levothyroxine   25 mcg Per Tube Q0600   multivitamin  1 tablet Per Tube QHS   nutrition supplement (JUVEN)  1 packet Per Tube BID BM   Continuous Infusions:  feeding supplement (JEVITY 1.5 CAL/FIBER) 1,000 mL (01/05/25 2115)   sodium chloride  irrigation     PRN Meds:.mouth rinse  Labs Reviewed: Sodium 131 Potassium 5.2 BUN 119 Creatinine 3.11 Ammonia 42 GFR 21 Vitamin A  45  Zinc  300  CRP 20.9  CBG ranges from 109-124 mg/dL over the last 24 hours No A1C  NUTRITION - FOCUSED PHYSICAL EXAM:  Flowsheet Row Most Recent Value  Orbital Region Severe depletion  Upper Arm Region Severe depletion  Thoracic and Lumbar Region Severe depletion  Buccal Region Severe depletion  Temple Region Severe depletion  Clavicle Bone Region Severe depletion  Clavicle and Acromion Bone Region Severe depletion  Scapular Bone Region Severe depletion  Dorsal Hand Severe depletion  Patellar Region Severe depletion  Anterior Thigh Region Severe depletion  Posterior Calf Region Severe depletion  Edema (RD Assessment) None   Hair Reviewed  Eyes Reviewed  Mouth Reviewed  Skin Reviewed  Nails Reviewed    Diet Order:   Diet Order             Diet NPO time specified  Diet effective now                   EDUCATION NEEDS:   Not appropriate for education at this time  Skin:  Skin Assessment: Skin Integrity Issues: Skin Integrity Issues:: Stage IV, Stage I, DTI DTI: Vertebral column Stage I: Left ear Stage IV: Coccyx  Last BM:  1/20; Smear per RN  Height:   Ht Readings from Last 1 Encounters:  01/01/25 6' (1.829 m)    Weight:   Wt Readings from Last 1 Encounters:  12/19/24 59.7 kg    Ideal Body Weight:  80.9 kg  BMI:  Body mass index is 16.71 kg/m.  Estimated Nutritional Needs:   Kcal:  2300-2600  Protein:  120-140 grams  Fluid:  UOP + 1L   Olivia Kenning, RD Registered Dietitian  See Amion for more information

## 2025-01-06 NOTE — Progress Notes (Signed)
" °   01/06/25 1210  Vitals  Temp 98.5 F (36.9 C)  Pulse Rate 84  Resp 16  BP (!) 110/51  SpO2 97 %  O2 Device Room Air  Weight  (unable to obtain, Nurse aware)  Oxygen Therapy  Patient Activity (if Appropriate) In bed  Post Treatment  Dialyzer Clearance Clear  Liters Processed 72  Fluid Removed (mL) 0 mL  Tolerated HD Treatment Yes    "

## 2025-01-07 DIAGNOSIS — Z515 Encounter for palliative care: Secondary | ICD-10-CM | POA: Diagnosis not present

## 2025-01-07 DIAGNOSIS — Z7189 Other specified counseling: Secondary | ICD-10-CM | POA: Diagnosis not present

## 2025-01-07 DIAGNOSIS — D62 Acute posthemorrhagic anemia: Secondary | ICD-10-CM | POA: Diagnosis not present

## 2025-01-07 DIAGNOSIS — L89150 Pressure ulcer of sacral region, unstageable: Secondary | ICD-10-CM | POA: Diagnosis not present

## 2025-01-07 DIAGNOSIS — N186 End stage renal disease: Secondary | ICD-10-CM | POA: Diagnosis not present

## 2025-01-07 DIAGNOSIS — Z992 Dependence on renal dialysis: Secondary | ICD-10-CM | POA: Diagnosis not present

## 2025-01-07 DIAGNOSIS — K922 Gastrointestinal hemorrhage, unspecified: Secondary | ICD-10-CM | POA: Diagnosis not present

## 2025-01-07 DIAGNOSIS — D471 Chronic myeloproliferative disease: Secondary | ICD-10-CM | POA: Diagnosis not present

## 2025-01-07 DIAGNOSIS — M8660 Other chronic osteomyelitis, unspecified site: Secondary | ICD-10-CM | POA: Diagnosis not present

## 2025-01-07 DIAGNOSIS — R532 Functional quadriplegia: Secondary | ICD-10-CM | POA: Diagnosis not present

## 2025-01-07 LAB — MAGNESIUM: Magnesium: 1.9 mg/dL (ref 1.7–2.4)

## 2025-01-07 LAB — RENAL FUNCTION PANEL
Albumin: 2.4 g/dL — ABNORMAL LOW (ref 3.5–5.0)
Anion gap: 9 (ref 5–15)
BUN: 80 mg/dL — ABNORMAL HIGH (ref 8–23)
CO2: 29 mmol/L (ref 22–32)
Calcium: 8.5 mg/dL — ABNORMAL LOW (ref 8.9–10.3)
Chloride: 93 mmol/L — ABNORMAL LOW (ref 98–111)
Creatinine, Ser: 2 mg/dL — ABNORMAL HIGH (ref 0.61–1.24)
GFR, Estimated: 35 mL/min — ABNORMAL LOW
Glucose, Bld: 97 mg/dL (ref 70–99)
Phosphorus: 2.6 mg/dL (ref 2.5–4.6)
Potassium: 4.9 mmol/L (ref 3.5–5.1)
Sodium: 132 mmol/L — ABNORMAL LOW (ref 135–145)

## 2025-01-07 LAB — GLUCOSE, CAPILLARY
Glucose-Capillary: 107 mg/dL — ABNORMAL HIGH (ref 70–99)
Glucose-Capillary: 116 mg/dL — ABNORMAL HIGH (ref 70–99)
Glucose-Capillary: 95 mg/dL (ref 70–99)
Glucose-Capillary: 98 mg/dL (ref 70–99)

## 2025-01-07 LAB — HEMOGLOBIN AND HEMATOCRIT, BLOOD
HCT: 20.6 % — ABNORMAL LOW (ref 39.0–52.0)
HCT: 26 % — ABNORMAL LOW (ref 39.0–52.0)
Hemoglobin: 6.8 g/dL — CL (ref 13.0–17.0)
Hemoglobin: 8.4 g/dL — ABNORMAL LOW (ref 13.0–17.0)

## 2025-01-07 LAB — PREPARE RBC (CROSSMATCH)

## 2025-01-07 LAB — CBC
HCT: 22 % — ABNORMAL LOW (ref 39.0–52.0)
Hemoglobin: 7.3 g/dL — ABNORMAL LOW (ref 13.0–17.0)
MCH: 29.8 pg (ref 26.0–34.0)
MCHC: 33.2 g/dL (ref 30.0–36.0)
MCV: 89.8 fL (ref 80.0–100.0)
Platelets: 56 K/uL — ABNORMAL LOW (ref 150–400)
RBC: 2.45 MIL/uL — ABNORMAL LOW (ref 4.22–5.81)
RDW: 17.2 % — ABNORMAL HIGH (ref 11.5–15.5)
WBC: 8.7 K/uL (ref 4.0–10.5)
nRBC: 0.2 % (ref 0.0–0.2)

## 2025-01-07 MED ORDER — CHLORHEXIDINE GLUCONATE CLOTH 2 % EX PADS
6.0000 | MEDICATED_PAD | Freq: Every day | CUTANEOUS | Status: DC
  Administered 2025-01-08 – 2025-01-11 (×4): 6 via TOPICAL

## 2025-01-07 MED ORDER — LIDOCAINE 5 % EX PTCH
2.0000 | MEDICATED_PATCH | CUTANEOUS | Status: DC
  Administered 2025-01-07 – 2025-01-10 (×4): 2 via TRANSDERMAL
  Filled 2025-01-07 (×4): qty 2

## 2025-01-07 MED ORDER — SODIUM CHLORIDE 0.9% IV SOLUTION: Freq: Once | INTRAVENOUS | Status: AC

## 2025-01-07 MED ORDER — OXYCODONE HCL 5 MG PO TABS
10.0000 mg | ORAL_TABLET | Freq: Four times a day (QID) | ORAL | Status: DC
  Administered 2025-01-07: 10 mg
  Filled 2025-01-07: qty 2

## 2025-01-07 MED ORDER — OXYCODONE HCL 5 MG PO TABS
5.0000 mg | ORAL_TABLET | Freq: Four times a day (QID) | ORAL | Status: DC
  Administered 2025-01-08 – 2025-01-09 (×6): 5 mg
  Filled 2025-01-07 (×6): qty 1

## 2025-01-07 NOTE — Assessment & Plan Note (Addendum)
 PEG feeds recommendations per RD - Continue TF via G-tube  - Starting Jevity 1.5 at 25 ml/hr, advance by 10ml every 4 hrs to goal of 65 ml/hr ( 1,560 ml per day)  - 60 ml ProSource TF BID - Flush 30mL q2hr -MVI tablet daily - Juven BID - Strict NPO, per tube only

## 2025-01-07 NOTE — Assessment & Plan Note (Addendum)
 No evidence of acute GI bleed on CTAP 1/16, likely secondary to ischemic colitis per GI.  Hemoglobin stable overnight. --GI recommendations as follows:  -- Avoid endoscopic procedure given risks outweighing benefits  -- Monitor hemoglobin q8-12 hours -- Heme Onc   --[facility] 01/20 for further recommendations and LVM on nursing line for call back   -Will reach back out to [place] heme-onc today however if no response will be consulting in-house heme-onc provider --Platelet transfusion threshold <20 or uncontrolled bleeding per in house heme/onc provider consult -- Appreciate ongoing palliative recommendations -- Continue tube feeds per RD recommendation  -- Continue IV Protonix  40 mg Daily  -- Pain control: Tylenol , increase oxycodone  to 10 mg scheduled -- Fall and delirium precautions

## 2025-01-07 NOTE — Plan of Care (Signed)
 FMTS Interim Progress Note  S: Notified by nurse that patient temperature was slightly warmer with a lower DBP during blood transfusion. Patient was seen and examined at bedside by myself and Dr. Lafe. He appears the same as this morning's exam, eyes open and responding with grunts. Able to shake his head no when asked if in any pain.   O: BP (!) 122/50   Pulse 90   Temp 99.6 F (37.6 C) (Oral)   Resp 18   Ht 6' (1.829 m)   Wt 55.9 kg   SpO2 100%   BMI 16.71 kg/m   General: A&O, NAD, cachectic and chronically ill appearing HEENT: No sign of trauma, EOM grossly intact Cardiac: distant heart sounds, no m/r/g, 2+ radial pulses Respiratory: CTAB, normal WOB, no w/c/r GI: non-distended  Extremities: NTTP, no peripheral edema.  A/P: Plan as per day team progress note including: - Continue to monitor for VS changes indicating transfusion reaction, will d/c transfusion at that time  - Contact FMTS with any other concerns  Lupie Credit, DO 01/07/2025, 5:13 PM PGY-1, [facility] Family Medicine Service pager [phone]

## 2025-01-07 NOTE — Progress Notes (Signed)
 Patient had a Large BM that was very liquid and it was red RN notified MD team.

## 2025-01-07 NOTE — Assessment & Plan Note (Addendum)
 Anticoagulation inappropriate given high risk of bleed. Unfortunately DBP dropping. Expect will need to gradually take off all BP/HR medications.  -- Decrease metoprolol  to 12.5mg  BID  -- Discontinue home diltiazem  90mg , reassess need if HR goes up

## 2025-01-07 NOTE — Assessment & Plan Note (Addendum)
 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  Unable to collect accurate UA on admission due to significant hematuria. No signs of infection currently. -- Appreciate ongoing urology recommendations -- urology d/c foley 01/18 -- paraphimosis reduced 01/18 -- 60f foley placed 01/20  -- Consider IV TXA: Discussed with urology today, will run by heme-onc  -- If bleeding continues and can be cleared for anesthesia, consider cystoscopy with fulguration -- further irrigation as appropriate

## 2025-01-07 NOTE — Plan of Care (Signed)
 [facility] to [facility]. In the meantime, spoke with in-house oncology on call who states they will come see patient and review risk benefit of TXA use in this patient.  Spoke to [facility] fellow who states there is no stark objection to TXA use to help with bleeding however a discussion should be had with patient and daughter about risks of clotting and mentioned further care should be deferred to in-house heme/onc.  Appreciate specialist time and recommendations.

## 2025-01-07 NOTE — Assessment & Plan Note (Signed)
 Likely ESRD however nephrology monitoring for signs of renal recovery. --Recommendations per nephrology:  --Continue TTS HD schedule  --Daily RFP, Mg  --Strict I&O -- Avoid nephrotoxic medications

## 2025-01-07 NOTE — Progress Notes (Signed)
 I was in to see him this morning and the urine was very pale pink on minimal CBI.  I don't see a need for cystoscopy at this time.

## 2025-01-07 NOTE — Progress Notes (Signed)
 "    Daily Progress Note Intern Pager: [phone]  Patient name: Ricardo [name] Medical record number: [id number] Date of birth: August 11, 1951 Age: 74 y.o. Gender: male  Primary Care Provider: [facility] [place] Consultants: GI, urology, palliative Code Status: DNR/DNI   Pt Overview and Major Events to Date:  01/15: Admitted for GI bleed   Assessment and Plan:   Ricardo [name] is a 74 year old male admitted for GI bleed.  He was transferred from [facility], where he has resided for 3 months due to sepsis complicated by sacral pressure ulcer and osteomyelitis, E. coli peritonitis and encephalopathy. Hospital course remains complicated by persistent GI and GU bleeding requiring transfusions. Appreciate goals of care discussions with Palliative and daughter.  Assessment & Plan Primary myelofibrosis (HCC) Ischemic colitis No evidence of acute GI bleed on CTAP 1/16, likely secondary to ischemic colitis per GI.  Hemoglobin stable overnight. --GI recommendations as follows:  -- Avoid endoscopic procedure given risks outweighing benefits  -- Monitor hemoglobin q8-12 hours -- Heme Onc   --[facility]/20 for further recommendations and LVM on nursing line for call back   -Will reach back out to [facility] today however if no response will be consulting in-house heme-onc provider --Platelet transfusion threshold <20 or uncontrolled bleeding per in house heme/onc provider consult -- Appreciate ongoing palliative recommendations -- Continue tube feeds per RD recommendation  -- Continue IV Protonix  40 mg Daily  -- Pain control: Tylenol , increase oxycodone  to 10 mg scheduled -- Fall and delirium precautions Hematuria 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  Unable to collect accurate UA on admission due to significant hematuria. No signs of infection currently. -- Appreciate ongoing urology  recommendations -- urology d/c foley 01/18 -- paraphimosis reduced 01/18 -- 42f foley placed 01/20  -- Consider IV TXA: Discussed with urology today, will run by heme-onc  -- If bleeding continues and can be cleared for anesthesia, consider cystoscopy with fulguration -- further irrigation as appropriate Dialysis dependent AKI Likely ESRD however nephrology monitoring for signs of renal recovery. 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 Was on IV meropenem  and linezolid  at OSH, discontinued per ID as patient had completed sufficient course for indication and likely contribution to thrombocytopenia.   -- Risks of surgical debridement outweigh benefit per general surgery, signed off -- Turn patient over 2 hours while in bed, pressure relief every 15 minutes when in chair  -- Wound care per [facility] consult note 1/16 -- PT/OT eval and treat -Pain management as above Atrial fibrillation (HCC) Anticoagulation inappropriate given high risk of bleed. Unfortunately DBP dropping. Expect will need to gradually take off all BP/HR medications.  -- Decrease metoprolol  to 12.5mg  BID  -- Discontinue home diltiazem  90mg , reassess need if HR goes up Severe protein-calorie malnutrition PEG (percutaneous endoscopic gastrostomy) status (HCC) Underweight (BMI < 18.5) PEG feeds recommendations per RD - Continue TF via G-tube  - Starting Jevity 1.5 at 25 ml/hr, advance by 10ml every 4 hrs to goal of 65 ml/hr ( 1,560 ml per day)  - 60 ml ProSource TF BID - Flush 30mL q2hr -MVI tablet daily - Juven BID - Strict NPO, per tube only Chronic health problem H/o metabolic  encephalopathy and  insomnia: continue home sertraline  50mg  per tube daily; holding home trazodone  100mg  daily Hypotension: continue home midodrine  10mg  per tube TID Hypothyroidism: continue home levothyroxine  25mcg per tube daily Neuropathy: continue gabapentin  100mg  per tube QHS  FEN/GI: NPO, per tube only per RD recommendations PPx: N/A Dispo:SNF pending clinical improvement . Barriers include persistent bleeding, inability to sit up for HD.   Subjective:  Patient was seen and examined at bedside.  Difficult to communicate with, however he is able to grunt yes that he is in pain.  When asked to point to where his pain is he uses his right hand and index finger to point to his bottom.  Objective: Temp:  [97.8 F (36.6 C)-99.8 F (37.7 C)] 98.6 F (37 C) (01/21 0350) Pulse Rate:  [80-107] 107 (01/21 0350) Resp:  [16-30] 17 (01/21 0350) BP: (97-133)/(41-77) 114/51 (01/21 0350) SpO2:  [91 %-100 %] 100 % (01/21 0350) Physical Exam: General: cachectic, chronically ill-appearing male lying supine in hospital bed, in no acute distress Eyes: Nonicteric, EOMI ENTM: Dry mucous membranes, partially edentulous without top teeth Cardiovascular: Distant heart sounds, 2+ radial pulses Respiratory: normal WOB on RA Gastrointestinal: Soft, mild diffuse tenderness, nondistended, G tube in place, BS present MSK: Obvious muscle wasting Derm: No rashes Neuro: Able to grunt y/n to questions and follow simple commands such as pointing   Laboratory: Most recent CBC Lab Results  Component Value Date   WBC 8.7 01/07/2025   HGB 7.3 (L) 01/07/2025   HCT 22.0 (L) 01/07/2025   MCV 89.8 01/07/2025   PLT 56 (L) 01/07/2025   Most recent BMP    Latest Ref Rng & Units 01/07/2025    3:31 AM  BMP  Glucose 70 - 99 mg/dL 97   BUN 8 - 23 mg/dL 80   Creatinine 9.38 - 1.24 mg/dL 7.99   Sodium 864 - 854 mmol/L 132   Potassium 3.5 - 5.1 mmol/L 4.9   Chloride 98 - 111 mmol/L 93   CO2 22 - 32 mmol/L 29   Calcium  8.9 - 10.3  mg/dL 8.5    Lupie Credit, DO 01/07/2025, 7:19 AM  PGY-1, [facility] FPTS Intern pager: [phone], text pages welcome Secure chat group [facility] Teaching Service   "

## 2025-01-07 NOTE — Assessment & Plan Note (Addendum)
 Severe sacral pressure ulcer with osteomyelitis and chronic pain, present on admission. Was on IV meropenem  and linezolid  at OSH, discontinued per ID as patient had completed sufficient course for indication and likely contribution to thrombocytopenia.   -- Risks of surgical debridement outweigh benefit per general surgery, signed off -- Turn patient over 2 hours while in bed, pressure relief every 15 minutes when in chair  -- Wound care per [facility] consult note 1/16 -- PT/OT eval and treat -Pain management as above

## 2025-01-07 NOTE — Consult Note (Addendum)
 [name] CONSULT NOTE  Patient Care Team: [facility] [place] as PCP - General  CHIEF COMPLAINTS/PURPOSE OF CONSULTATION:  GI Bleeding, Hematuria, Hx of myelofibrosis  REFERRING PHYSICIAN: Dr. Donzetta  HISTORY OF PRESENTING ILLNESS:  Ricardo [name] 74 y.o. male is a 74 year old male patient who presented on 01/01/2025 with complaints of bright red blood from rectum.  Workup was done patient found to be anemic.  He has history of myelofibrosis, follows at the [facility].  Therefore oncology evaluation has been requested. Patient is seen, assessed and examined today. He is nonverbal, did not respond with words, mumbled incoherently.  Does not appear to be in any acute distress. He is cachectic and ill-appearing. Peg tube intact with feeding ongoing.  No family at bedside during visit.  Medical history as stated is significant for myelofibrosis, anemia and ESRD on dialysis. Surgical history significant for prostatectomy in 2014.      I have reviewed his chart and materials related to his cancer extensively and collaborated history with the patient. Summary of oncologic history is as follows: Oncology History   No problem history exists.    ASSESSMENT & PLAN:  Myelofibrosis Anemia GI bleeding -Anemia likely multifactorial, related to GI bleeding, hematuria, myelofibrosis, renal disease - Follows with hematology/oncology at [facility].  Noted in documentation that call has been placed to patient's primary oncologist. - Noted with low hemoglobin 6.2 on 01/05/2025.  Status post PRBC transfusions.  Most recent hemoglobin 7.3 today 01/07/2025 - Recommend PRBC transfusion for hemoglobin <7.0 - Continue to monitor CBC with differential -- Urology following -Medical oncology/Dr. Lanny will make further evaluation and treatment recommendations.  Hematuria Hx of radiation cystitis -- CBI ongoing -- Urology following  ESRD - On dialysis 3 days/week - Nephrology managing  Failure to  thrive Altered mental status -- Continue PEG feedings -- Continue supportive care -- Consider Palliative eval for goals of care      MEDICAL HISTORY:  Past Medical History:  Diagnosis Date   Alcohol abuse    Anemia    Arthritis    Carpal tunnel syndrome    CKD (chronic kidney disease) stage 3, GFR 30-59 ml/min (HCC) 07/03/2024   Gout    History of radiation therapy 04/10/2017-06/04/2017   Site/dose:   The prostatic fossa was treated to 68.4 Gy in 38 fractions of 1.8 Gy   Myelofibrosis (HCC)    Prostate cancer [facility])     SURGICAL HISTORY: Past Surgical History:  Procedure Laterality Date   COLONOSCOPY WITH PROPOFOL  N/A 09/10/2023   Procedure: COLONOSCOPY WITH PROPOFOL ;  Surgeon: Cindie Carlin POUR, DO;  Location: [facility];  Service: Endoscopy;  Laterality: N/A;  11:30 am, asa 3   CYSTECTOMY     left bicept   IR GASTROSTOMY TUBE MOD SED  11/11/2024   IR TUNNELED CENTRAL VENOUS CATH PLC W IMG  10/28/2024   POLYPECTOMY  09/10/2023   Procedure: POLYPECTOMY;  Surgeon: Cindie Carlin POUR, DO;  Location: [facility];  Service: Endoscopy;;   PROSTATECTOMY  2014   TENDON REPAIR     right thumb    SOCIAL HISTORY: Social History   Socioeconomic History   Marital status: Divorced    Spouse name: Not on file   Number of children: Not on file   Years of education: Not on file   Highest education level: Not on file  Occupational History   Not on file  Tobacco Use   Smoking status: Never    Passive exposure: Never  Smokeless tobacco: Current    Types: Snuff  Vaping Use   Vaping status: Never Used  Substance and Sexual Activity   Alcohol use: Not Currently    Comment: last has ETOH 7 years ago as of 09/07/2023   Drug use: No   Sexual activity: Not Currently  Other Topics Concern   Not on file  Social History Narrative   Not on file   Social Drivers of Health   Tobacco Use: High Risk (01/02/2025)   Patient History    Smoking Tobacco Use: Never    Smokeless  Tobacco Use: Current    Passive Exposure: Never  Financial Resource Strain: Low Risk  (10/02/2024)   Received from [facility]   Overall Financial Resource Strain (CARDIA)    Difficulty of Paying Living Expenses: Not hard at all  Food Insecurity: No Food Insecurity (01/02/2025)   Epic    Worried About Programme Researcher, Broadcasting/film/video in the Last Year: Never true    Ran Out of Food in the Last Year: Never true  Transportation Needs: No Transportation Needs (01/02/2025)   Epic    Lack of Transportation (Medical): No    Lack of Transportation (Non-Medical): No  Physical Activity: Not on file  Stress: No Stress Concern Present (10/01/2024)   Received from [facility]   [employer] of Occupational Health - Occupational Stress Questionnaire    Feeling of Stress : Not at all  Recent Concern: Stress - Stress Concern Present (08/03/2024)   Received from [facility] of Occupational Health - Occupational Stress Questionnaire    Do you feel stress - tense, restless, nervous, or anxious, or unable to sleep at night because your mind is troubled all the time - these days?: To some extent  Social Connections: Patient Unable To Answer (01/02/2025)   Social Connection and Isolation Panel    Frequency of Communication with Friends and Family: Patient unable to answer    Frequency of Social Gatherings with Friends and Family: Patient unable to answer    Attends Religious Services: Patient unable to answer    Active Member of Clubs or Organizations: Patient unable to answer    Attends Banker Meetings: Patient unable to answer    Marital Status: Patient unable to answer  Intimate Partner Violence: Not At Risk (01/02/2025)   Epic    Fear of Current or Ex-Partner: No    Emotionally Abused: No    Physically Abused: No    Sexually Abused: No  Depression (PHQ2-9): Not on file  Alcohol Screen: Not on file  Housing: Low Risk (01/02/2025)   Epic    Unable to Pay for Housing  in the Last Year: No    Number of Times Moved in the Last Year: 0    Homeless in the Last Year: No  Utilities: Not At Risk (01/02/2025)   Epic    Threatened with loss of utilities: No  Health Literacy: Not on file    FAMILY HISTORY: Family History  Problem Relation Age of Onset   Heart disease Mother    Hypertension Sister    Cancer Maternal Aunt        breast   Cancer Maternal Uncle        colon   Cancer Maternal Uncle        lung     PHYSICAL EXAMINATION: ECOG PERFORMANCE STATUS: 4 - Bedbound  Vitals:   01/07/25 0350 01/07/25 0742  BP: (!) 114/51 (!) 112/51  Pulse: ROLLEN)  107 98  Resp: 17 18  Temp: 98.6 F (37 C) 99.7 F (37.6 C)  SpO2: 100% 99%   Filed Weights   01/03/25 0453 01/03/25 1437  Weight: 130 lb 8.2 oz (59.2 kg) 123 lb 3.8 oz (55.9 kg)    GENERAL: alert, +nonverbal +frail and cachectic SKIN: skin color, texture, turgor are normal, no rashes +sacral decub EYES: normal, conjunctiva are pink and non-injected, sclera clear OROPHARYNX: no exudate, no erythema and lips, buccal mucosa, and tongue normal  NECK: supple, thyroid normal size, non-tender, without nodularity LYMPH: no palpable lymphadenopathy in the cervical, axillary or inguinal LUNGS: clear to auscultation and percussion with normal breathing effort HEART: regular rate & rhythm and no murmurs and no lower extremity edema ABDOMEN: abdomen soft, non-tender and normal bowel sounds MUSCULOSKELETAL: no cyanosis of digits and no clubbing  PSYCH: +altered mental status NEURO: no focal motor/sensory deficits   ALLERGIES:  has no known allergies.  MEDICATIONS:  Current Facility-Administered Medications  Medication Dose Route Frequency Provider Last Rate Last Admin   acetaminophen  (TYLENOL ) tablet 1,000 mg  1,000 mg Per Tube Q6H Majeed, Sara, DO   1,000 mg at 01/07/25 1404   Or   acetaminophen  (TYLENOL ) suppository 650 mg  650 mg Rectal Q6H Majeed, Sara, DO       [START ON 01/08/2025] Chlorhexidine   Gluconate Cloth 2 % PADS 6 each  6 each Topical Q0600 Geralynn Charleston, MD       feeding supplement (JEVITY 1.5 CAL/FIBER) liquid 1,000 mL  1,000 mL Per Tube Continuous Pray, Margaret E, MD 65 mL/hr at 01/06/25 2313 Infusion Verify at 01/06/25 2313   feeding supplement (PROSource TF20) liquid 60 mL  60 mL Per Tube BID Pray, Margaret E, MD   60 mL at 01/07/25 9166   free water  30 mL  30 mL Per Tube Q2H Majeed, Sara, DO   30 mL at 01/07/25 1401   gabapentin  (NEURONTIN ) 250 MG/5ML solution 100 mg  100 mg Per Tube QHS Lupie Credit, DO   100 mg at 01/06/25 2108   latanoprost  (XALATAN ) 0.005 % ophthalmic solution 1 drop  1 drop Both Eyes QHS Majeed, Sara, DO   1 drop at 01/06/25 2105   levothyroxine  (SYNTHROID ) tablet 25 mcg  25 mcg Per Tube Q0600 Lupie Credit, DO   25 mcg at 01/07/25 0522   lidocaine  (LIDODERM ) 5 % 2 patch  2 patch Transdermal Q24H Lupie Credit, DO   2 patch at 01/07/25 1245   metoprolol  tartrate (LOPRESSOR ) tablet 12.5 mg  12.5 mg Per Tube BID Pray, Margaret E, MD   12.5 mg at 01/07/25 9165   midodrine  (PROAMATINE ) tablet 10 mg  10 mg Per Tube TID WC Majeed, Sara, DO   10 mg at 01/07/25 1245   multivitamin (RENA-VIT) tablet 1 tablet  1 tablet Per Tube QHS Pray, Margaret E, MD   1 tablet at 01/06/25 2108   nutrition supplement (JUVEN) (JUVEN) powder packet 1 packet  1 packet Per Tube BID BM Pray, Margaret E, MD   1 packet at 01/07/25 1409   Oral care mouth rinse  15 mL Mouth Rinse 4 times per day Pray, Margaret E, MD   15 mL at 01/07/25 1246   Oral care mouth rinse  15 mL Mouth Rinse PRN Donzetta Rollene BRAVO, MD       oxyCODONE  (Oxy IR/ROXICODONE ) immediate release tablet 10 mg  10 mg Per Tube Q6H Bhagat, Virali, DO   10 mg at 01/07/25 1404   pantoprazole  (PROTONIX )  injection 40 mg  40 mg Intravenous Q12H Donzetta Rollene BRAVO, MD   40 mg at 01/07/25 9165   sertraline  (ZOLOFT ) tablet 50 mg  50 mg Per Tube Daily Majeed, Sara, DO   50 mg at 01/07/25 9165   sodium chloride  irrigation 0.9 % 3,000 mL   3,000 mL Irrigation Continuous Delia Ole ORN, NP   3,000 mL at 01/07/25 9687     LABORATORY DATA:  I have reviewed the data as listed Lab Results  Component Value Date   WBC 8.7 01/07/2025   HGB 7.3 (L) 01/07/2025   HCT 22.0 (L) 01/07/2025   MCV 89.8 01/07/2025   PLT 56 (L) 01/07/2025   Recent Labs    12/30/24 2000 01/01/25 0405 01/01/25 1849 01/01/25 1857 01/04/25 0412 01/04/25 0413 01/05/25 0333 01/06/25 0326 01/07/25 0331  NA 137   < > 137   < >  --    < > 132* 131* 132*  K 4.1   < > 3.7   < >  --    < > 4.7 5.2* 4.9  CL 97*   < > 98   < >  --    < > 94* 93* 93*  CO2 29   < > 26   < >  --    < > 29 25 29   GLUCOSE 124*   < > 105*   < >  --    < > 118* 125* 97  BUN 43*   < > 32*   < >  --    < > 90* 119* 80*  CREATININE 1.67*   < > 1.33*   < >  --    < > 2.57* 3.11* 2.00*  CALCIUM  9.2   < > 8.8*   < >  --    < > 9.0 8.9 8.5*  GFRNONAA 43*   < > 57*   < >  --    < > 26* 21* 35*  PROT 7.8  --  7.4  --  7.2  --   --   --   --   ALBUMIN 2.7*   < > 2.5*   < > 2.6*   < > 2.7* 2.4* 2.4*  AST 52*  --  54*  --  48*  --   --   --   --   ALT 36  --  32  --  26  --   --   --   --   ALKPHOS 378*  --  327*  --  300*  --   --   --   --   BILITOT 0.4  --  0.5  --  0.5  --   --   --   --   BILIDIR  --   --   --   --  0.1  --   --   --   --   IBILI  --   --   --   --  0.4  --   --   --   --    < > = values in this interval not displayed.    RADIOGRAPHIC STUDIES: I have personally reviewed the radiological images as listed and agreed with the findings in the report. CT ABDOMEN PELVIS WO CONTRAST Result Date: 01/02/2025 EXAM: CT ABDOMEN AND PELVIS WITHOUT CONTRAST 01/02/2025 12:07:35 PM TECHNIQUE: CT of the abdomen and pelvis was performed without the administration of intravenous contrast. Multiplanar reformatted images are provided for  review. Automated exposure control, iterative reconstruction, and/or weight-based adjustment of the mA/kV was utilized to reduce the radiation  dose to as low as reasonably achievable. COMPARISON: 12/08/2024 CLINICAL HISTORY: Lower GI bleed. FINDINGS: LOWER CHEST: Moderate right and small left pleural effusion with passive atelectasis. The catheter terminates in the right atrium. Low density blood pool suggests anemia. LIVER: The liver is unremarkable. GALLBLADDER AND BILE DUCTS: Gallbladder wall thickening versus small amount of pericholecystic fluid. Gallstones noted. No biliary ductal dilatation. SPLEEN: Splenomegaly. Similar appearance of 3 cm lesion inferiorly in the spleen. PANCREAS: No acute abnormality. ADRENAL GLANDS: No acute abnormality. KIDNEYS, URETERS AND BLADDER: Mild right hydronephrosis and moderate right hydroureter. No definite ureteral stone seen. Ureteral margins indistinct distally. Stable left renal cyst. No further imaging workup is needed for this cyst. Mild to moderate left hydronephrosis and moderate left hydroureter. A Foley catheter is embedded in high density material in the lumen of the urinary bladder which was not present on 12/08/2024 and presumably represents a blood product/hematoma. Small amount of gas noted in the urinary bladder. No stones in the kidneys. No perinephric or periureteral stranding. GI AND BOWEL: Stomach demonstrates no acute abnormality. PEG (Percutaneous Endoscopic Gastrostomy) tube noted. No dilated bowel noted. Possible bowel wall thickening in the lower rectum with continued presacral edema. Cannot exclude wall thickening in the ascending colon and hepatic flexure, although the appearance could be due to peristaltic activity. There is no bowel obstruction. PERITONEUM AND RETROPERITONEUM: Suspected mild to moderate ascites. No free air. Diffuse mesenteric edema noted. VASCULATURE: Systemic atherosclerosis is present, including the aorta and iliac arteries. LYMPH NODES: No lymphadenopathy. REPRODUCTIVE ORGANS: No acute abnormality. BONES AND SOFT TISSUES: 5.1 x 2.3 cm complex fluid collection along the  right iliacus muscle on image 64 series 3, previously 6.0 x 3.5 cm on 12/08/2024. This could be hematoma or abscess. Large sacral decubitus ulcer with chronic bony destructive findings along the S5 segment of the sacrum and sacrococcygeal junction, and a 1.2 cm gap between the lower margin of the sacrum (which may be exposed to the atmosphere) and the remaining coccyx on image 63 series 7. Lower lumbar spondylosis and degenerative disc disease. Mild subcutaneous edema. IMPRESSION: 1. Possible bowel wall thickening in the lower rectum with presacral edema, and cannot exclude wall thickening in the ascending colon and hepatic flexure. 2. Low density blood pool, which can be seen with anemia. 3. Foley catheter embedded in high-density material within the urinary bladder, presumably blood product or hematoma, new from prior, with a small amount of intravesical gas. 4. Mild right hydronephrosis with moderate right hydroureter and mild to moderate left hydronephrosis with moderate left hydroureter, without a definite ureteral stone. 5. 5.1 x 2.3 cm complex fluid collection along the right iliacus muscle, which could represent hematoma or abscess, decreased in size compared to prior. 6. Large sacral decubitus ulcer with chronic bony destructive change at the S5 segment and sacrococcygeal junction, with a 1.2 cm gap between the lower sacrum and remaining coccyx. 7. Moderate right and small left pleural effusions with passive atelectasis. 8. Gallstones with gallbladder wall thickening versus a small amount of pericholecystic fluid. 9. Suspected mild to moderate ascites with diffuse mesenteric edema and mild subcutaneous edema. 10. Splenomegaly with a similar 3 cm inferior splenic lesion. 11. Systemic atherosclerosis including the aorta and iliac arteries. 12. Lower lumbar spondylosis and degenerative disc disease. Electronically signed by: Ryan Salvage MD 01/02/2025 12:50 PM EST RP Workstation: [id number]   DG Abd 1  View Result  Date: 12/12/2024 CLINICAL DATA:  Abdominal tenderness EXAM: ABDOMEN - 1 VIEW COMPARISON:  October 01, 2024 FINDINGS: Gastrostomy tube is noted in grossly good position. Mild to moderate amount of stool is noted in the right colon. Phleboliths are noted in the pelvis. No abnormal bowel dilatation. IMPRESSION: Gastrostomy tube in grossly good position. Mild to moderate stool burden. No abnormal bowel dilatation. Electronically Signed   By: Lynwood Landy Raddle M.D.   On: 12/12/2024 13:16   DG CHEST PORT 1 VIEW Result Date: 12/10/2024 CLINICAL DATA:  Fever EXAM: PORTABLE CHEST 1 VIEW COMPARISON:  October 20, 2024 FINDINGS: Stable cardiomediastinal silhouette. Right internal jugular dialysis catheter is noted with distal tip in right atrium. Minimal bibasilar subsegmental atelectasis is noted. Bony thorax is unremarkable. IMPRESSION: Minimal bibasilar subsegmental atelectasis. Electronically Signed   By: Lynwood Landy Raddle M.D.   On: 12/10/2024 14:18   MR PELVIS WO CONTRAST Result Date: 12/10/2024 CLINICAL DATA:  Sacral wound.  Concern for osteomyelitis. EXAM: MRI PELVIS WITHOUT CONTRAST TECHNIQUE: Multiplanar multisequence MR imaging of the pelvis was performed. No intravenous contrast was administered. COMPARISON:  CT abdomen/pelvis dated 12/08/2024 FINDINGS: Bones: Redemonstrated midline wound posterior to the sacrum and coccyx extending deep to the level of the sacrococcygeal junction with foci of susceptibility artifact compatible with gas extending through the defect at the level of the sacrococcygeal junction and associated marrow signal abnormality, compatible with osteomyelitis. No abscess. There is evidence of red marrow reconversion involving the pelvis and bilateral proximal femora. No acute fracture or dislocation. No aggressive osseous lesion. No evidence of avascular necrosis. Soft tissue and Muscle: There is diffuse body wall anasarca and intramuscular edema. There is a heterogenous T1  intermediate to hyperintense in T2 hyperintense collection in the right iliacus muscle measuring approximately 6.0 by 3.4 by 8.0 cm, most compatible with an intramuscular hematoma.Bilateral gluteal cuff insertions and hamstring tendon origins are intact. IMPRESSION: 1. Midline wound posterior to the sacrum and coccyx extending deep to the level of the sacrococcygeal junction with gas extending through the defect at the level of the sacrococcygeal junction and associated marrow signal abnormality, compatible with osteomyelitis. No abscess. 2. 8.0 x 6.0 x 3.4 cm intramuscular hematoma within the right iliacus muscle. 3. Diffuse body wall anasarca and intramuscular edema. Electronically Signed   By: Harrietta Sherry M.D.   On: 12/10/2024 09:58     I personally spent a total of 40 minutes minutes in the care of the patient today including preparing to see the patient, performing a medically appropriate exam/evaluation, referring and communicating with other health care professionals, documenting clinical information in the EHR, communicating results, and coordinating care.    All questions were answered. The patient knows to call the clinic with any problems, questions or concerns. No barriers to learning was detected.  Olam PARAS Rouson, NP 1/21/20263:24 PM     Addendum I have seen the patient, examined him. I agree with the assessment and and plan and have edited the notes.   I reviewed the chart, including the hematology Dr. Sueanne note from [facility] in 06/2024.  Patient was diagnosed with JAK2 positive myelofibrosis in 2013, previously on Hydrea which was changed to Jakafi in 2015, had ESA in 2022 but did not have good response, became blood transfusion dependent, started Luspatercept  in 09/2023 and responded moderately well, but has had multiple hospital admission due to anemia and thrombocytopenia, bleeding.  His myelofibrosis likely has progressed, or even possibly has transformed to leukemia. I called his  daughter and  discussed the overall poor prognosis from MF and option of bone marrow biopsy. Given his overall poor health condition, I recommend supportive care alone with blood transfusion. His daughter agrees.  He did have stage II prostate cancer, status post surgery in 2017 and the radiation in 2018.  Last PSA was negative in July 2025.  OK to use TXA given the GOC is palliative, and continue blood transfusion as needed. We will f/u as needed. I spent a total of for his visit today.   Onita Mattock MD 01/07/2025

## 2025-01-07 NOTE — Progress Notes (Signed)
 " [facility] Progress Note   Subjective:    Pt seen in room Eyes open, difficult to communicate with   Objective Vitals:   01/06/25 1615 01/06/25 2048 01/07/25 0350 01/07/25 0742  BP: 122/77 (!) 133/50 (!) 114/51 (!) 112/51  Pulse: 92 97 (!) 107 98  Resp: 18 18 17 18   Temp: 99.8 F (37.7 C) 99.8 F (37.7 C) 98.6 F (37 C) 99.7 F (37.6 C)  TempSrc:  Axillary    SpO2: 100% 100% 100% 99%  Weight:      Height:       Physical Exam General: chronically ill appearing, very frail male in NAD Heart: RRR, no murmurs Lungs: Clear anteriorly, respirations unlabored Abdomen: Soft and non-distended Extremities: No edema b/l lower extremities Dialysis Access:  TDC  Last 2 HD sessions here ->  3h  0- 1 L UF Bun 40-100 here, creat 1.6- 3.2    Assessment/Plan:  Dialysis dependent AKI  -has been on renal replacement therapy since August 2025 (since [facility] to [facility]), had been doing HD TTS at [facility] -may be deemed as ESRD, let's watch for any semblence of renal recovery in the interim before deeming as ESRD therefore please monitor strict I/O & daily labs. Some UOP but not enough to stop dialysis at this time.  - HD TTS schedule here  - next HD tomorrow    Anemia of Chronic Kidney Disease ABLA, GI Bleed Transfusion dependent myelofibrosis -transfuse prn for hgb <7 -S/p 3 unit PRBCs so far. Hgb now is 7.7 -GI following--conservative mgmt for now   Hematuria -intermittent issue, h/o prostate Ca per chart -urology following -s/p irrigation with urology 1/16 -s/p foley catheter placement today by Urology -per Urology may consider IV TXA or cystoscopy with fulguration   Chronic osteomyelitis -on meropenem  and linezolid   until 01/31/24 -per primary   Severe protein-calorie malnutirion -with PEG -per primary   Secondary hyperparathyroidism -phos and calcium  at goal   GOC - palliative following - would have to be able to dialyze in the chair for 3.5- 4 hrs  before being dc'd to a SNF - I asked PT about HD in chair -> they think it his highly unlikely that he would tolerate HD in the chair, due to combination of severe debility (total assist to roll over) and the sacral wound    Dispo -Monitoring for renal recovering but more likely ESRD   Myer Fret  MD  [facility] 01/07/2025, 12:14 PM  Recent Labs  Lab 01/06/25 0326 01/06/25 1556 01/07/25 0330 01/07/25 0331  HGB  --  7.0* 7.3*  --   ALBUMIN 2.4*  --   --  2.4*  CALCIUM  8.9  --   --  8.5*  PHOS 3.4  --   --  2.6  CREATININE 3.11*  --   --  2.00*  K 5.2*  --   --  4.9    Inpatient medications:  acetaminophen   1,000 mg Per Tube Q6H   Or   acetaminophen   650 mg Rectal Q6H   Chlorhexidine  Gluconate Cloth  6 each Topical Q0600   feeding supplement (PROSource TF20)  60 mL Per Tube BID   free water   30 mL Per Tube Q2H   gabapentin   100 mg Per Tube QHS   latanoprost   1 drop Both Eyes QHS   levothyroxine   25 mcg Per Tube Q0600   lidocaine   2 patch Transdermal Q24H   metoprolol  tartrate  12.5 mg Per Tube BID   midodrine   10 mg Per Tube TID WC   multivitamin  1 tablet Per Tube QHS   nutrition supplement (JUVEN)  1 packet Per Tube BID BM   mouth rinse  15 mL Mouth Rinse 4 times per day   oxyCODONE   10 mg Per Tube Q6H   pantoprazole  (PROTONIX ) IV  40 mg Intravenous Q12H   sertraline   50 mg Per Tube Daily    feeding supplement (JEVITY 1.5 CAL/FIBER) 65 mL/hr at 01/06/25 2313   sodium chloride  irrigation     mouth rinse        "

## 2025-01-07 NOTE — Assessment & Plan Note (Signed)
 H/o metabolic encephalopathy and insomnia: continue home sertraline  50mg  per tube daily; holding home trazodone  100mg  daily Hypotension: continue home midodrine  10mg  per tube TID Hypothyroidism: continue home levothyroxine  per tube daily Neuropathy: continue gabapentin  100mg  per tube QHS

## 2025-01-07 NOTE — Progress Notes (Signed)
 Date and time results received: 01/07/25 1551 (use smartphrase .now to insert current time)  Test: HGB Critical Value: 6.8  Name of Provider Notified: Camie Dixons DO  Orders Received? Or Actions Taken?: awaiting orders

## 2025-01-07 NOTE — Progress Notes (Signed)
 Occupational Therapy Treatment Patient Details Name: ZALE MARCOTTE MRN: [id number] DOB: 1951/08/26 Today's Date: 01/07/2025   History of present illness RAYMONT ANDREONI is a 74 y.o. male presenting with rectal bleeding. W/u for GIB.  Recently admitted with septic shock in Aug 2025, on dialysis, multiple intubations and PEG placement. [facility]. For his chronic sacral wounds, a bedside debridement was done on 12/25/2024.  PMHx of JAK2+ primary myelofibrosis (transfusion dependent follows with heme/onc at the [facility]), prostate cancer, COPD, chronic hypoxic respiratory failure, A-fib, insomnia, gout, and ESRD on HD, PUD   OT comments  Pt slowly progressing towards OT goals. Foucs of session on progressing functional mobility tolerance and increasing engagement in ADL tasks bed level. Pt required max A +2 to engage in rolling and Min A for ADL grooming tasks bed level. Pt participated in BUE ROM exercises as noted below. OT to continue to follow Pt acutely to facilitate progress towards goals. Patient will benefit from continued inpatient follow up therapy, <3 hours/day.       If plan is discharge home, recommend the following:  Two people to help with bathing/dressing/bathroom;Direct supervision/assist for medications management;Assistance with feeding;Two people to help with walking and/or transfers   Equipment Recommendations  Other (comment) (TBD next venue)    Recommendations for Other Services      Precautions / Restrictions Precautions Precautions: Fall Precaution/Restrictions Comments: Sacral ulcer, foley catheter, PEG tube Restrictions Weight Bearing Restrictions Per Provider Order: No       Mobility Bed Mobility Overal bed mobility: Needs Assistance Bed Mobility: Rolling Rolling: Max assist         General bed mobility comments: Max A to roll to R and L for toielting hygiene and wound care    Transfers                   General transfer  comment: Did not attempt transfer to EOB.     Balance                                           ADL either performed or assessed with clinical judgement   ADL Overall ADL's : Needs assistance/impaired     Grooming: Wash/dry face;Oral care;Set up;Bed level;Minimal assistance Grooming Details (indicate cue type and reason): Set-up A for washing face, increased assistance to brush teeth with suction brush.                     Toileting- Clothing Manipulation and Hygiene: Total assistance Toileting - Clothing Manipulation Details (indicate cue type and reason): Posterior peri care and wound care completed bed level. Max A to roll and maintain sidelying for hygiene.            Extremity/Trunk Assessment Upper Extremity Assessment Upper Extremity Assessment: RUE deficits/detail;LUE deficits/detail;Right hand dominant RUE Deficits / Details: AAROM shoulder flexion 0-100 degrees approximately, elbow flexion/extension strength 3+/5, grip strength 3/5 with noted atrophy in thenar eminence.  Decreased FM coordination noted duirng functional grooming task RUE Coordination: decreased fine motor;decreased gross motor LUE Deficits / Details: AAROM shoulder flexion 0-100 degrees approximately, elbow flexion/extension strength 3+/5, grip strength 3+/5. Decreased FM coordination noted duirng functional grooming task LUE Coordination: decreased gross motor;decreased fine motor            Vision       Perception     Praxis  Communication Communication Communication: Impaired Factors Affecting Communication: Difficulty expressing self;Reduced clarity of speech   Cognition Arousal: Alert Behavior During Therapy: Flat affect Cognition: Difficult to assess Difficult to assess due to: Impaired communication           OT - Cognition Comments: Dysarthric speech                 Following commands: Impaired Following commands impaired: Follows one step  commands with increased time, Follows one step commands inconsistently      Cueing   Cueing Techniques: Verbal cues, Tactile cues, Visual cues  Exercises Exercises: General Upper Extremity General Exercises - Upper Extremity Shoulder Flexion: AAROM, Both, 5 reps, Supine Elbow Flexion: AAROM, Both, 5 reps Elbow Extension: AAROM, Both, 5 reps Digit Composite Flexion: Both, 5 reps, AAROM    Shoulder Instructions       General Comments Primary nurse present for session to complete wound care.    Pertinent Vitals/ Pain       Pain Assessment Pain Assessment: Faces Faces Pain Scale: Hurts whole lot Pain Location: buttocks wound Pain Descriptors / Indicators: Grimacing Pain Intervention(s): Limited activity within patient's tolerance, Monitored during session, Repositioned  Home Living                                          Prior Functioning/Environment              Frequency  Min 1X/week        Progress Toward Goals  OT Goals(current goals can now be found in the care plan section)  Progress towards OT goals: Progressing toward goals  Acute Rehab OT Goals Patient Stated Goal: none stated this session OT Goal Formulation: With patient/family Time For Goal Achievement: 01/17/25 Potential to Achieve Goals: Fair ADL Goals Pt Will Perform Grooming: sitting (wash face and complete oral hygiene with min assist unsupported EOB) Pt Will Perform Upper Body Bathing: with min assist;sitting (unsupported EOB) Pt/caregiver will Perform Home Exercise Program: Increased ROM;Right Upper extremity;Left upper extremity;With written HEP provided (shoulder AROM and elbow hand strengthening with light resistance) Additional ADL Goal #1: Pt will roll from supine to the left with min assist in order to assist with toileting tasks. Additional ADL Goal #2: Pt will tolerate sitting EOB for 10 mins with no more than min guard assist in preparation for grooming and bathing  tasks.  Plan      Co-evaluation    PT/OT/SLP Co-Evaluation/Treatment: Yes Reason for Co-Treatment: Complexity of the patient's impairments (multi-system involvement);To address functional/ADL transfers;For patient/therapist safety   OT goals addressed during session: ADL's and self-care;Strengthening/ROM      AM-PAC OT 6 Clicks Daily Activity     Outcome Measure   Help from another person eating meals?: Total Help from another person taking care of personal grooming?: A Lot Help from another person toileting, which includes using toliet, bedpan, or urinal?: Total Help from another person bathing (including washing, rinsing, drying)?: Total Help from another person to put on and taking off regular upper body clothing?: A Lot Help from another person to put on and taking off regular lower body clothing?: Total 6 Click Score: 8    End of Session    OT Visit Diagnosis: Muscle weakness (generalized) (M62.81)   Activity Tolerance Patient tolerated treatment well   Patient Left in bed;with call bell/phone within reach;with nursing/sitter in room   Nurse  Communication Other (comment) (need for wound care supplies)        Time: 8992-8962 OT Time Calculation (min): 30 min  Charges: OT General Charges $OT Visit: 1 Visit OT Treatments $Self Care/Home Management : 8-22 mins  Maurilio CROME, OTR/L.  [facility] Acute Rehabilitation  Office: [phone]   Maurilio PARAS Demitrius Crass 01/07/2025, 12:07 PM

## 2025-01-07 NOTE — Progress Notes (Signed)
" ° °  °  Subjective: NAEON. Pt nonverbal. Purewick and collection cannister full of frank blood and clot.   Objective: Vital signs in last 24 hours: Temp:  [98.5 F (36.9 C)-99.8 F (37.7 C)] 99.7 F (37.6 C) (01/21 0742) Pulse Rate:  [80-107] 98 (01/21 0742) Resp:  [16-20] 18 (01/21 0742) BP: (97-133)/(41-77) 112/51 (01/21 0742) SpO2:  [97 %-100 %] 99 % (01/21 0742)  Assessment/Plan: # Radiation cystitis # Clot retention of urine  60f hematuria Foley placed by Dr. Watt on 01/02/2025 with large volume of clot material hand irrigated.    Patient continued to improve over the weekend and patient underwent voiding trial 01/04/2025.    Return of hematuria with 3 way hematuria coude placed 1/19. Failed to improve with hand irrigation. CBI restarted 1/20.   Clear irrigant on fast gtt the morning of 1/21. Reduced flow to low gtt and reviewed goals with nursing. No need for cystoscopy and fulguration at this time.   Considering IV TXA +/- bladder instillation and dwell pending input from pharmacy. Appears to dialyze off well. If no clear direction, should be able to administer tomorrow am, a few hours before his HD.  Mild urinary bleeding, notable blood in stool 1/19. Transfuse < 7.0  Urology will follow  Intake/Output from previous day: 01/20 0701 - 01/21 0700 In: 10471.3 [NG/GT:1471.3] Out: 79669 [Urine:20300; Emesis/NG output:30]  Intake/Output this shift: Total I/O In: 30 [NG/GT:30] Out: -   Physical Exam:  General: Patient nonverbal CV: No cyanosis Lungs: equal chest rise Gu: 48f hematuria coud in place draining light pink irrigant/urine.  CBI with nearly clear irrigant on fast gtt, reduced to slow  Lab Results: Recent Labs    01/06/25 0324 01/06/25 1556 01/07/25 0330  HGB 7.7* 7.0* 7.3*  HCT 22.7* 20.8* 22.0*   BMET Recent Labs    01/06/25 0324 01/06/25 0326 01/06/25 1556 01/07/25 0330 01/07/25 0331  NA  --  131*  --   --  132*  K  --  5.2*  --   --  4.9   CL  --  93*  --   --  93*  CO2  --  25  --   --  29  GLUCOSE  --  125*  --   --  97  BUN  --  119*  --   --  80*  CREATININE  --  3.11*  --   --  2.00*  CALCIUM   --  8.9  --   --  8.5*  HGB 7.7*  --  7.0* 7.3*  --   WBC 8.4  --   --  8.7  --      Studies/Results: No results found.    LOS: 6 days   Ole Bourdon, NP [facility] Pager: [phone]  01/07/2025, 10:48 AM  "

## 2025-01-07 NOTE — Progress Notes (Signed)
 "                                                                                                                                                         Daily Progress Note   Patient Name: Ricardo [name]       Date: 01/07/2025 DOB: [birth year]  Age: 74 y.o. MRN#: [id number] Attending Physician: Donzetta Rollene BRAVO, MD Primary Care Physician: [facility] [place] Admit Date: 01/01/2025  Reason for Consultation/Follow-up: Establishing goals of care  Subjective: Patient states his pain level is alright today.  He says it is worse over his bottom.  No visitors present.  Created space and opportunity for patient's thoughts and feelings on patient's current illness. He did not respond when I asked about his palliative visits at [facility]. He nodded his understanding of my introduction of the role of PMT. Explored his quality of life and he stated it is currently fine. I was unable to elicit a response about what would be intolerable in the future, giving examples such as inability to talk or walk, worsening pain/suffering. I provided him with updates on the medical team's concern regarding his ability to tolerate outpatient HD now that LTAC is no longer an option.  He was having a hard time staying awake, ultimately indicated that he understood after a few attempts explaining.    Counseled on the importance of listening to his body and considering best case and worst case scenario. His body is telling him that he would be able to sit in HD with more time and strengthening. He is agreeable with current interventions while allowing more time for outcomes. Informed him that I would call his daughter with an update on our conversation. During my phone call with [name], she voiced great appreciation for efforts to support patient's medical decision making. She was surprised last night that he was more talkative and grateful that urine is clearing up. She has a clear understanding of medical team's  concerns and everything working against her dad, leading on her faith in this difficult time.  No other needs at this time.  Questions and concerns addressed. PMT will continue to support holistically.   Objective: Medical records reviewed including most recent family medicine, urology, nephrology, PT, OT on 1/21 as well as RD note 1/20. Ongoing concern from several teams for patient's debility and weakness hindering his ability to sit for HD in the outpatient setting. Consideration of TXA for hematuria though will need to discuss [facility] vs pursue consult at [facility]. He has irrigation of bladder again today.  Labs reviewed.   Physical Exam Vitals and nursing note reviewed.  Constitutional:      General: He is not in acute distress.    Appearance: He is ill-appearing.  Comments: Tired  HENT:     Head: Normocephalic and atraumatic.     Mouth/Throat:     Mouth: Mucous membranes are dry.  Cardiovascular:     Rate and Rhythm: Normal rate.  Pulmonary:     Effort: Pulmonary effort is normal. No tachypnea or respiratory distress.  Skin:    General: Skin is warm and dry.  Neurological:     Mental Status: He is alert. Mental status is at baseline.  Psychiatric:        Speech: Speech is slurred.        Behavior: Behavior normal.     Comments: Difficult to understand speech            Vital Signs: BP (!) 112/51 (BP Location: Right Arm)   Pulse 98   Temp 99.7 F (37.6 C)   Resp 18   Ht 6' (1.829 m)   Wt 55.9 kg   SpO2 99%   BMI 16.71 kg/m  SpO2: SpO2: 99 % O2 Device: O2 Device: Room Air O2 Flow Rate:        Palliative Assessment/Data: 30% (on tube feeds)   Palliative Care Assessment & Plan   Patient Profile: 74 y.o. 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    [facility] since prolonged admission at [facility] and discharge on 10/01/2024.  He was seen several times by inpatient palliative team there.  Patient's daughter reports concern with ongoing hematuria and insufficient wound care despite her continued attempts to advocate for patient's needs.  In the ED, he received PRBC, also received 2 units in dialysis.  GI consulted and suspect bleeding is from ischemic colitis/hypotension.   PMT has been consulted to assist with goals of care conversation. After initial discussion, decision was made to proceed with hospitalization and escalate to ICU if indicated, with NO tracheostomy as a limitation. Follow up on 1/19 to ensure care plan remains within patient and family's previously stated goals on 1/16.  Assessment: Goals of care conversation  Chronic osteomyelitis and sacral wounds Debility and deconditioning Myelofibrosis Hematuria  Recommendations/Plan: Continue DNR/DNI Continue current care plan. Goals of care are clear to treat the treatable and continue life-prolonging measures.  Patient feels that he will eventually have the strength to sit for outpatient dialysis.  Patient's daughter is supportive of his wishes Ongoing GOC discussions and palliative support   Prognosis:  Unable to determine  Discharge Planning: To Be Determined  Care plan was discussed with patient, patient's daughter, FMTS providers         Ewelina Naves SHAUNNA Fell, PA-C  Palliative Medicine Team Team phone # [phone]  Thank you for allowing the Palliative Medicine Team to assist in the care of this patient. Please utilize secure chat with additional questions, if there is no response within 30 minutes please call the above phone number.  Palliative Medicine Team providers are  available by phone from 7am to 7pm daily and can be reached through the team cell phone.  Should this patient require assistance outside of these hours, please call the patient's attending physician.   I personally spent a total of 40 minutes in the care of the patient today including preparing to see the  patient, getting/reviewing separately obtained history, performing a medically appropriate exam/evaluation, counseling and educating, referring and communicating with other health care professionals, and documenting clinical information in the EHR.  "

## 2025-01-07 NOTE — Progress Notes (Signed)
 Physical Therapy Treatment Patient Details Name: Ricardo [name] MRN: [id number] DOB: 12-03-1951 Today's Date: 01/07/2025   History of Present Illness 74 y.o. male admitted from [facility] on 01/01/25 with hematuria, workup for likely radiation cystitis, ischemic colitis. Of note, admit 07/2024 for septic shock, requiring multiple intubations and PEG placement; d/c to [facility] 10/01/24 complicated by sacral ulcer, osteomyelitis, E. Coli peritonitis, encephalopathy. Other PMH includes JAK2+ primary myelofibrosis (transfusion dependent follows with heme/onc at the [place]), prostate cancer, COPD, A-fib, insomnia, gout, ESRD on HD.   PT Comments  Pt with limited mobility progression. Pt requiring max-totalA for bed mobility, including rolling and repositioning; pt unaware of bowel incontinence soiling sacral wound dressings, dependent for pericare and RN present for dressing change. Pt with sacral wound pain resulting in poor tolerance for mobility, including HOB elevated beyond 30'; also limited by diffuse weakness, decreased activity tolerance, impaired cognition and communication. Will continue to follow acutely to address established goals.      If plan is discharge home, recommend the following: Two people to help with walking and/or transfers;Assistance with cooking/housework;Two people to help with bathing/dressing/bathroom;Assistance with feeding;Assist for transportation;Help with stairs or ramp for entrance;Supervision due to cognitive status   Can travel by private vehicle      No  Equipment Recommendations  Hospital bed;Hoyer lift (TBD)    Recommendations for Other Services       Precautions / Restrictions Precautions Precautions: Fall;Other (comment) Recall of Precautions/Restrictions: Impaired Precaution/Restrictions Comments: large sacral ulcer, foley with CBI, PEG tube Restrictions Weight Bearing Restrictions Per Provider Order: No     Mobility  Bed Mobility Overal bed  mobility: Needs Assistance Bed Mobility: Rolling Rolling: Max assist, +2 for safety/equipment, Used rails, Total assist         General bed mobility comments: maxA+1-2 to roll R/L for pericare due to bowel incontinence, pt able to maintain sidelying with RUE bed rail support and modA for trunk rotation when BLEs positioned; totalA+2 to scoot up in bed, limited tolerance for HOB elevated beyond 30'. partially rolled onto L side end of session for pressure relief schedule at end of session (RN present)    Transfers                   General transfer comment: did not attempt to sit EOB or lift transfer to recliner given apparent pain during bed mobility and repositioning (including poor tolerance for HOB elevated >30')    Ambulation/Gait                   Stairs             Wheelchair Mobility     Tilt Bed    Modified Rankin (Stroke Patients Only)       Balance Overall balance assessment: Needs assistance                                          Communication Communication Communication: Impaired Factors Affecting Communication: Difficulty expressing self;Reduced clarity of speech  Cognition Arousal: Alert, Lethargic Behavior During Therapy: Flat affect   PT - Cognitive impairments: Difficult to assess Difficult to assess due to: Impaired communication                     PT - Cognition Comments: pt requiring intermittent cues to attend to task; pt speaking when asked questions but speech  very difficult to understand; difficulty making needs known at times Following commands: Impaired Following commands impaired: Follows one step commands with increased time, Follows one step commands inconsistently    Cueing Cueing Techniques: Verbal cues, Tactile cues, Visual cues  Exercises Other Exercises Other Exercises: BLE AAROM, including knee flex/ext, hip flex/abd/add; pt with bilateral quad and hamstring activation (R>L), no  significant ankle AROM noted    General Comments General comments (skin integrity, edema, etc.): pt with bowel incontinence during session; RN present to perform wound care since sacral pad soiled; dependent for pericare/hygiene and repositioning. pt partially rolled onto L side end of session for pressure relief schedule after RN completing sacral wound care. Dr. Geralynn (Nephrology) reached out to inquire if pt could tolerate HD seated in chair; suspect pt with poor tolerance for this secondary to painful sacral wound and max-totalA for bed-level mobility      Pertinent Vitals/Pain Pain Assessment Pain Assessment: Faces Faces Pain Scale: Hurts whole lot Pain Location: sacral wound with repositioning Pain Descriptors / Indicators: Grimacing Pain Intervention(s): Limited activity within patient's tolerance, Monitored during session, Repositioned, Other (comment) (wound redressed during session)    Home Living                          Prior Function            PT Goals (current goals can now be found in the care plan section) Acute Rehab PT Goals Patient Stated Goal: unable to state Progress towards PT goals: Not progressing toward goals - comment    Frequency    Min 1X/week      PT Plan      Co-evaluation   Reason for Co-Treatment: Complexity of the patient's impairments (multi-system involvement);To address functional/ADL transfers;For patient/therapist safety   OT goals addressed during session: ADL's and self-care;Strengthening/ROM      AM-PAC PT 6 Clicks Mobility   Outcome Measure  Help needed turning from your back to your side while in a flat bed without using bedrails?: Total Help needed moving from lying on your back to sitting on the side of a flat bed without using bedrails?: Total Help needed moving to and from a bed to a chair (including a wheelchair)?: Total Help needed standing up from a chair using your arms (e.g., wheelchair or bedside  chair)?: Total Help needed to walk in hospital room?: Total Help needed climbing 3-5 steps with a railing? : Total 6 Click Score: 6    End of Session   Activity Tolerance: Patient limited by fatigue;Patient limited by pain Patient left: in bed;with call bell/phone within reach;with bed alarm set Nurse Communication: Mobility status;Need for lift equipment PT Visit Diagnosis: Muscle weakness (generalized) (M62.81);Other abnormalities of gait and mobility (R26.89)     Time: 8992-8962 PT Time Calculation (min) (ACUTE ONLY): 30 min  Charges:    $Therapeutic Activity: 8-22 mins PT General Charges $$ ACUTE PT VISIT: 1 Visit                     Darice Almas, PT, DPT Acute Rehabilitation Services  Personal: Secure Chat Rehab Office: [phone]  Darice LITTIE Almas 01/07/2025, 12:51 PM

## 2025-01-08 DIAGNOSIS — Z931 Gastrostomy status: Secondary | ICD-10-CM | POA: Diagnosis not present

## 2025-01-08 DIAGNOSIS — M8660 Other chronic osteomyelitis, unspecified site: Secondary | ICD-10-CM | POA: Diagnosis not present

## 2025-01-08 DIAGNOSIS — Z992 Dependence on renal dialysis: Secondary | ICD-10-CM | POA: Diagnosis not present

## 2025-01-08 DIAGNOSIS — R310 Gross hematuria: Secondary | ICD-10-CM | POA: Diagnosis not present

## 2025-01-08 DIAGNOSIS — D62 Acute posthemorrhagic anemia: Secondary | ICD-10-CM | POA: Diagnosis not present

## 2025-01-08 DIAGNOSIS — R532 Functional quadriplegia: Secondary | ICD-10-CM | POA: Diagnosis not present

## 2025-01-08 DIAGNOSIS — Z515 Encounter for palliative care: Secondary | ICD-10-CM | POA: Diagnosis not present

## 2025-01-08 DIAGNOSIS — E875 Hyperkalemia: Secondary | ICD-10-CM

## 2025-01-08 DIAGNOSIS — D471 Chronic myeloproliferative disease: Secondary | ICD-10-CM | POA: Diagnosis not present

## 2025-01-08 DIAGNOSIS — E43 Unspecified severe protein-calorie malnutrition: Secondary | ICD-10-CM | POA: Diagnosis not present

## 2025-01-08 DIAGNOSIS — Z681 Body mass index (BMI) 19 or less, adult: Secondary | ICD-10-CM | POA: Diagnosis not present

## 2025-01-08 DIAGNOSIS — L89150 Pressure ulcer of sacral region, unstageable: Secondary | ICD-10-CM | POA: Diagnosis not present

## 2025-01-08 DIAGNOSIS — N186 End stage renal disease: Secondary | ICD-10-CM | POA: Diagnosis not present

## 2025-01-08 DIAGNOSIS — Z7189 Other specified counseling: Secondary | ICD-10-CM | POA: Diagnosis not present

## 2025-01-08 DIAGNOSIS — K922 Gastrointestinal hemorrhage, unspecified: Secondary | ICD-10-CM | POA: Diagnosis not present

## 2025-01-08 LAB — GLUCOSE, CAPILLARY
Glucose-Capillary: 102 mg/dL — ABNORMAL HIGH (ref 70–99)
Glucose-Capillary: 107 mg/dL — ABNORMAL HIGH (ref 70–99)
Glucose-Capillary: 112 mg/dL — ABNORMAL HIGH (ref 70–99)
Glucose-Capillary: 114 mg/dL — ABNORMAL HIGH (ref 70–99)
Glucose-Capillary: 81 mg/dL (ref 70–99)
Glucose-Capillary: 92 mg/dL (ref 70–99)

## 2025-01-08 LAB — RENAL FUNCTION PANEL
Albumin: 2.3 g/dL — ABNORMAL LOW (ref 3.5–5.0)
Anion gap: 13 (ref 5–15)
BUN: 109 mg/dL — ABNORMAL HIGH (ref 8–23)
CO2: 26 mmol/L (ref 22–32)
Calcium: 8.8 mg/dL — ABNORMAL LOW (ref 8.9–10.3)
Chloride: 92 mmol/L — ABNORMAL LOW (ref 98–111)
Creatinine, Ser: 2.63 mg/dL — ABNORMAL HIGH (ref 0.61–1.24)
GFR, Estimated: 25 mL/min — ABNORMAL LOW
Glucose, Bld: 103 mg/dL — ABNORMAL HIGH (ref 70–99)
Phosphorus: 4.3 mg/dL (ref 2.5–4.6)
Potassium: 5.7 mmol/L — ABNORMAL HIGH (ref 3.5–5.1)
Sodium: 131 mmol/L — ABNORMAL LOW (ref 135–145)

## 2025-01-08 LAB — CBC
HCT: 20.3 % — ABNORMAL LOW (ref 39.0–52.0)
Hemoglobin: 6.7 g/dL — CL (ref 13.0–17.0)
MCH: 30.2 pg (ref 26.0–34.0)
MCHC: 33 g/dL (ref 30.0–36.0)
MCV: 91.4 fL (ref 80.0–100.0)
Platelets: 63 K/uL — ABNORMAL LOW (ref 150–400)
RBC: 2.22 MIL/uL — ABNORMAL LOW (ref 4.22–5.81)
RDW: 16.2 % — ABNORMAL HIGH (ref 11.5–15.5)
WBC: 8.8 K/uL (ref 4.0–10.5)
nRBC: 0.2 % (ref 0.0–0.2)

## 2025-01-08 LAB — PREPARE RBC (CROSSMATCH)

## 2025-01-08 LAB — HEMOGLOBIN AND HEMATOCRIT, BLOOD
HCT: 22.6 % — ABNORMAL LOW (ref 39.0–52.0)
Hemoglobin: 7.9 g/dL — ABNORMAL LOW (ref 13.0–17.0)

## 2025-01-08 MED ORDER — HEPARIN SODIUM (PORCINE) 1000 UNIT/ML DIALYSIS: 1000.0000 [IU] | INTRAMUSCULAR | Status: DC | PRN

## 2025-01-08 MED ORDER — PENTAFLUOROPROP-TETRAFLUOROETH EX AERO: 1.0000 | INHALATION_SPRAY | CUTANEOUS | Status: DC | PRN

## 2025-01-08 MED ORDER — LIDOCAINE-PRILOCAINE 2.5-2.5 % EX CREA: 1.0000 | TOPICAL_CREAM | CUTANEOUS | Status: DC | PRN

## 2025-01-08 MED ORDER — ANTICOAGULANT SODIUM CITRATE 4% (200MG/5ML) IV SOLN: 5.0000 mL | Status: DC | PRN

## 2025-01-08 MED ORDER — SODIUM CHLORIDE (PF) 0.9 % IJ SOLN
INTRAMUSCULAR | Status: AC
  Administered 2025-01-08: 10 mL
  Filled 2025-01-08: qty 10

## 2025-01-08 MED ORDER — HEPARIN SODIUM (PORCINE) 1000 UNIT/ML IJ SOLN
INTRAMUSCULAR | Status: AC
  Filled 2025-01-08: qty 4

## 2025-01-08 MED ORDER — SODIUM ZIRCONIUM CYCLOSILICATE 10 G PO PACK
10.0000 g | PACK | Freq: Once | ORAL | Status: AC
  Administered 2025-01-08: 10 g
  Filled 2025-01-08: qty 1

## 2025-01-08 MED ORDER — SODIUM CHLORIDE 0.9% IV SOLUTION: Freq: Once | INTRAVENOUS | Status: DC

## 2025-01-08 MED ORDER — ALTEPLASE 2 MG IJ SOLR: 2.0000 mg | Freq: Once | INTRAMUSCULAR | Status: DC | PRN

## 2025-01-08 MED ORDER — LIDOCAINE HCL (PF) 1 % IJ SOLN: 5.0000 mL | INTRAMUSCULAR | Status: DC | PRN

## 2025-01-08 NOTE — Progress Notes (Signed)
" ° °  °  Subjective: CBI med gtt, pink lemonade irrigant.   Objective: Vital signs in last 24 hours: Temp:  [97.7 F (36.5 C)-99.6 F (37.6 C)] 98.1 F (36.7 C) (01/22 0731) Pulse Rate:  [86-97] 88 (01/22 0731) Resp:  [18] 18 (01/22 0418) BP: (109-131)/(46-54) 125/54 (01/22 0731) SpO2:  [99 %-100 %] 100 % (01/22 0731) Weight:  [60.8 kg] 60.8 kg (01/22 0704)  Assessment/Plan: # Radiation cystitis # Clot retention of urine  15f hematuria Foley placed by Dr. Watt on 01/02/2025 with large volume of clot material hand irrigated.    Patient continued to improve over the weekend and patient underwent voiding trial 01/04/2025.    Return of hematuria with 3 way hematuria coude placed 1/19. Failed to improve with hand irrigation. CBI restarted 1/20.   Mild urinary bleeding, notable blood in stool 1/19. Transfuse < 7.0  Considering IV TXA +/- bladder instillation and dwell pending input from pharmacy. Appears to dialyze off well. If no clear contraindication, should be able to administer tomorrow am, a few hours before his HD.   Declined by primary team pending heme/onc assessment.  Hematuria slightly worse today.  Please contact urology once a decision regarding proposed treatment has been made.  Urology will follow  Intake/Output from previous day: 01/21 0701 - 01/22 0700 In: 15622.5 [Blood:322.5; NG/GT:300] Out: 75098 [Urine:23700; Drains:1200; Stool:1]  Intake/Output this shift: Total I/O In: 60 [NG/GT:60] Out: 1100 [Urine:1100]  Physical Exam:  General: Patient nonverbal CV: No cyanosis Lungs: equal chest rise Gu: 66f hematuria coud in place draining pink lemonade colored irrigant on medium gtt.  Lab Results: Recent Labs    01/07/25 1527 01/07/25 2122 01/08/25 0332  HGB 6.8* 8.4* 6.7*  HCT 20.6* 26.0* 20.3*   BMET Recent Labs    01/07/25 0330 01/07/25 0331 01/07/25 1527 01/07/25 2122 01/08/25 0332  NA  --  132*  --   --  131*  K  --  4.9  --   --  5.7*  CL   --  93*  --   --  92*  CO2  --  29  --   --  26  GLUCOSE  --  97  --   --  103*  BUN  --  80*  --   --  109*  CREATININE  --  2.00*  --   --  2.63*  CALCIUM   --  8.5*  --   --  8.8*  HGB 7.3*  --    < > 8.4* 6.7*  WBC 8.7  --   --   --  8.8   < > = values in this interval not displayed.     Studies/Results: No results found.    LOS: 7 days   Ricardo Bourdon, NP [facility] Pager: [phone]  01/08/2025, 10:49 AM  "

## 2025-01-08 NOTE — Assessment & Plan Note (Addendum)
 Anticoagulation inappropriate given high risk of bleed. Unfortunately DBP dropping. Expect will need to gradually take off all BP/HR medications.  -- Decrease metoprolol  to 12.5mg  BID  -- Continue to hold home diltiazem  90mg , reassess need if HR increases

## 2025-01-08 NOTE — Assessment & Plan Note (Addendum)
 H/o metabolic encephalopathy and insomnia: continue home sertraline  50mg  per tube daily; holding home trazodone  100mg  daily Hypotension: continue home midodrine  10mg  per tube TID Hypothyroidism: continue home levothyroxine  per tube daily Neuropathy: continue gabapentin  100mg  per tube QHS

## 2025-01-08 NOTE — Assessment & Plan Note (Addendum)
 No evidence of acute GI bleed on CTAP 1/16, likely secondary to ischemic colitis per GI. Hgb dropped to 6.7 overnight, plan to give 1u PRBC while in dialysis this morning. --GI recommendations as follows:  -- Avoid endoscopic procedure given risks outweighing benefits  -- Monitor hemoglobin q8-12 hours -- Heme/Onc consulted, appreciate recs -Multifactorial causes of anemia: GI bleeding, hematuria, myelofibrosis, end-stage renal disease -Recommend PRBC transfusion for hemoglobin less than 7 -Cleared to use TXA given GOC palliative, continue blood transfusions as needed  -- Discussed with patient's primary hematologist/oncologist at the [facility] 01/21 who recommends risk-benefit discussion of TXA administration with patient and daughter and further care through in-house heme-onc --Platelet transfusion threshold <20 or uncontrolled bleeding per in house heme/onc provider consult -- Appreciate ongoing palliative discussions -- Continue tube feeds per RD recommendation  -- Continue IV Protonix  40 mg Daily  -- Pain control: Tylenol , increase oxycodone  back to 10 mg scheduled as patient seemed comfortable on this dose -- Fall and delirium precautions

## 2025-01-08 NOTE — Assessment & Plan Note (Addendum)
 Severe sacral pressure ulcer with osteomyelitis and chronic pain, present on admission. Was on IV meropenem  and linezolid  at OSH, discontinued per ID as patient had completed sufficient course for indication and likely contribution to thrombocytopenia.   -- Risks of surgical debridement outweigh benefit per general surgery, signed off -- Turn patient over 2 hours while in bed, pressure relief every 15 minutes when in chair  -- Continue wound care per [facility] consult note 1/16 -- PT/OT eval and treat -Pain management as above

## 2025-01-08 NOTE — Assessment & Plan Note (Signed)
 5.7 this morning in the setting of ESRD. Expect to decrease with dialysis however will treat with Lokelma  as well x1.  - One time Lokelma  10mg  per tube

## 2025-01-08 NOTE — Plan of Care (Signed)
" °  Problem: Health Behavior/Discharge Planning: Goal: Ability to manage health-related needs will improve Outcome: Progressing   Problem: Clinical Measurements: Goal: Ability to maintain clinical measurements within normal limits will improve Outcome: Progressing Goal: Will remain free from infection Outcome: Progressing Goal: Respiratory complications will improve Outcome: Progressing Goal: Cardiovascular complication will be avoided Outcome: Progressing   Problem: Activity: Goal: Risk for activity intolerance will decrease Outcome: Progressing   Problem: Nutrition: Goal: Adequate nutrition will be maintained Outcome: Progressing   Problem: Coping: Goal: Level of anxiety will decrease Outcome: Progressing   Problem: Elimination: Goal: Will not experience complications related to bowel motility Outcome: Progressing Goal: Will not experience complications related to urinary retention Outcome: Progressing   Problem: Pain Managment: Goal: General experience of comfort will improve and/or be controlled Outcome: Progressing   Problem: Safety: Goal: Ability to remain free from injury will improve Outcome: Progressing   Problem: Skin Integrity: Goal: Risk for impaired skin integrity will decrease Outcome: Progressing   Problem: Education: Goal: Knowledge of disease and its progression will improve Outcome: Progressing Goal: Individualized Educational Video(s) Outcome: Progressing   Problem: Fluid Volume: Goal: Compliance with measures to maintain balanced fluid volume will improve Outcome: Progressing   Problem: Health Behavior/Discharge Planning: Goal: Ability to manage health-related needs will improve Outcome: Progressing   Problem: Nutritional: Goal: Ability to make healthy dietary choices will improve Outcome: Progressing   Problem: Clinical Measurements: Goal: Complications related to the disease process, condition or treatment will be avoided or  minimized Outcome: Progressing   Problem: Bowel/Gastric: Goal: Will show no signs and symptoms of gastrointestinal bleeding Outcome: Progressing   Problem: Fluid Volume: Goal: Will show no signs and symptoms of excessive bleeding Outcome: Progressing   Problem: Clinical Measurements: Goal: Complications related to the disease process, condition or treatment will be avoided or minimized Outcome: Progressing   Problem: Fluid Volume: Goal: Will show no signs and symptoms of excessive bleeding Outcome: Progressing   "

## 2025-01-08 NOTE — Progress Notes (Signed)
 " [facility] Progress Note   Subjective:    Pt seen in HD unit Opens eyes to voice  Objective Vitals:   01/08/25 1530 01/08/25 1545 01/08/25 1600 01/08/25 1611  BP: (!) 122/45 (!) 132/55  (!) 131/52  Pulse: 95 (!) 104 (!) 109 (!) 102  Resp: (!) 23 (!) 26 (!) 28 (!) 27  Temp:    99.2 F (37.3 C)  TempSrc:    Axillary  SpO2: 97% 100% 100% 100%  Weight:      Height:       Physical Exam General: chronically ill appearing, very frail male in NAD Heart: RRR, no murmurs Lungs: Clear anteriorly, respirations unlabored Abdomen: Soft and non-distended Extremities: No edema b/l lower extremities Dialysis Access:  TDC  Last 2 HD sessions here ->  3h  0- 1 L UF Bun 40-100 here, creat 1.6- 3.2    Assessment/Plan:  Dialysis dependent AKI  -has been on renal replacement therapy since August 2025 (since [facility] to [facility]), had been doing HD TTS at [facility] - may be deemed as ESRD - some UOP but not enough to stop dialysis at this time.  - HD TTS schedule here  - next HD today    Anemia of Chronic Kidney Disease ABLA, GI Bleed Transfusion dependent myelofibrosis -transfuse prn for hgb <7 -S/p 3 unit PRBCs so far -GI following--conservative mgmt for now   Hematuria -intermittent issue, h/o prostate Ca per chart -urology following -s/p irrigation with urology 1/16 -s/p foley catheter placement today by Urology -per Urology may consider IV TXA or cystoscopy with fulguration   Chronic osteomyelitis -on meropenem  and linezolid   until 01/31/24 -per primary   Severe protein-calorie malnutirion -with PEG -per primary   Secondary hyperparathyroidism -phos and calcium  at goal   GOC - palliative following - would need to dialyze in a chair 3.5- 4 hrs here before dc to SNF - I asked PT about HD in chair -> they think it his highly unlikely that he would tolerate HD in the chair, due to combination of severe debility (needs total assist to roll over) and the sacral  wound    Dispo -Monitoring for renal recovering but more likely ESRD   Myer Fret  MD  [facility] 01/08/2025, 4:17 PM  Recent Labs  Lab 01/07/25 0331 01/07/25 1527 01/07/25 2122 01/08/25 0332  HGB  --    < > 8.4* 6.7*  ALBUMIN 2.4*  --   --  2.3*  CALCIUM  8.5*  --   --  8.8*  PHOS 2.6  --   --  4.3  CREATININE 2.00*  --   --  2.63*  K 4.9  --   --  5.7*   < > = values in this interval not displayed.    Inpatient medications:  sodium chloride    Intravenous Once   acetaminophen   1,000 mg Per Tube Q6H   Or   acetaminophen   650 mg Rectal Q6H   Chlorhexidine  Gluconate Cloth  6 each Topical Q0600   feeding supplement (PROSource TF20)  60 mL Per Tube BID   free water   30 mL Per Tube Q2H   gabapentin   100 mg Per Tube QHS   latanoprost   1 drop Both Eyes QHS   levothyroxine   25 mcg Per Tube Q0600   lidocaine   2 patch Transdermal Q24H   metoprolol  tartrate  12.5 mg Per Tube BID   midodrine   10 mg Per Tube TID WC   multivitamin  1 tablet Per  Tube QHS   nutrition supplement (JUVEN)  1 packet Per Tube BID BM   mouth rinse  15 mL Mouth Rinse 4 times per day   oxyCODONE   5 mg Per Tube Q6H   pantoprazole  (PROTONIX ) IV  40 mg Intravenous Q12H   sertraline   50 mg Per Tube Daily    anticoagulant sodium citrate      feeding supplement (JEVITY 1.5 CAL/FIBER) 1,000 mL (01/08/25 1200)   sodium chloride  irrigation     alteplase , anticoagulant sodium citrate , heparin , lidocaine  (PF), lidocaine -prilocaine , mouth rinse, pentafluoroprop-tetrafluoroeth        "

## 2025-01-08 NOTE — Assessment & Plan Note (Addendum)
 PEG feeds recommendations per RD - Continue TF via G-tube  - Starting Jevity 1.5 at 25 ml/hr, advance by 10ml every 4 hrs to goal of 65 ml/hr ( 1,560 ml per day)  - 60 ml ProSource TF BID - Flush 30mL q2hr -MVI tablet daily - Juven BID - Strict NPO, per tube only

## 2025-01-08 NOTE — Progress Notes (Signed)
 "    Daily Progress Note Intern Pager: [phone]  Patient name: Ricardo [name] Medical record number: [id number] Date of birth: 12-16-1951 Age: 74 y.o. Gender: male  Primary Care Provider: [facility] [place] Consultants: GI, urology, palliative Code Status: DNR/DNI   Pt Overview and Major Events to Date:  01/15: Admitted for GI bleed   Assessment and Plan:   Ricardo [name] is a 74 year old male admitted for GI bleed.  He was transferred from [facility], where he has resided for 3 months due to sepsis complicated by sacral pressure ulcer and osteomyelitis, E. coli peritonitis and encephalopathy. Hospital course remains complicated by persistent GI and GU bleeding requiring transfusions. Appreciate continuing goals of care discussions with Palliative and daughter.  Assessment & Plan Primary myelofibrosis (HCC) Ischemic colitis No evidence of acute GI bleed on CTAP 1/16, likely secondary to ischemic colitis per GI. Hgb dropped to 6.7 overnight, plan to give 1u PRBC while in dialysis this morning. --GI recommendations as follows:  -- Avoid endoscopic procedure given risks outweighing benefits  -- Monitor hemoglobin q8-12 hours -- Heme/Onc consulted, appreciate recs -Multifactorial causes of anemia: GI bleeding, hematuria, myelofibrosis, end-stage renal disease -Recommend PRBC transfusion for hemoglobin less than 7 -Cleared to use TXA given GOC palliative, continue blood transfusions as needed  -- Discussed with patient's primary hematologist/oncologist at the [facility] 01/21 who recommends risk-benefit discussion of TXA administration with patient and daughter and further care through in-house heme-onc --Platelet transfusion threshold <20 or uncontrolled bleeding per in house heme/onc provider consult -- Appreciate ongoing palliative discussions -- Continue tube feeds per RD recommendation  -- Continue IV Protonix  40 mg Daily  -- Pain control: Tylenol , increase oxycodone  back to 10 mg  scheduled as patient seemed comfortable on this dose -- Fall and delirium precautions Hematuria 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  Unable to collect accurate UA on admission due to significant hematuria which continues. -- Appreciate ongoing urology recommendations -- urology d/c foley 01/18 -- paraphimosis reduced 01/18 -- 7f foley placed 01/20  -- Consider IV TXA: Okay by heme-onc, plan for risk-benefit discussion with patient daughter 01/23  -- If bleeding continues and can be cleared for anesthesia, consider cystoscopy with fulguration -- further irrigation as appropriate Hyperkalemia 5.7 this morning in the setting of ESRD. Expect to decrease with dialysis however will treat with Lokelma  as well x1.  - One time Lokelma  10mg  per tube Dialysis dependent AKI Likely ESRD however nephrology monitoring for signs of renal recovery. 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 Was on IV meropenem  and linezolid  at OSH, discontinued per ID as patient had completed sufficient course for indication and likely contribution to thrombocytopenia.   -- Risks of surgical debridement outweigh benefit per general surgery, signed off -- Turn patient over 2 hours while in bed, pressure relief every 15 minutes when in chair  -- Continue wound care per [facility] consult note 1/16 -- PT/OT eval and treat -Pain management as above Atrial fibrillation (HCC) Anticoagulation inappropriate given high risk of bleed. Unfortunately DBP dropping. Expect will need to gradually take off all BP/HR medications.  -- Decrease metoprolol  to 12.5mg  BID   -- Continue to hold home diltiazem  90mg , reassess need if HR  increases Severe protein-calorie malnutrition PEG (percutaneous endoscopic gastrostomy) status (HCC) Underweight (BMI < 18.5) PEG feeds recommendations per RD - Continue TF via G-tube  - Starting Jevity 1.5 at 25 ml/hr, advance by 10ml every 4 hrs to goal of 65 ml/hr ( 1,560 ml per day)  - 60 ml ProSource TF BID - Flush 30mL q2hr -MVI tablet daily - Juven BID - Strict NPO, per tube only Chronic health problem H/o metabolic encephalopathy and insomnia: continue home sertraline  50mg  per tube daily; holding home trazodone  100mg  daily Hypotension: continue home midodrine  10mg  per tube TID Hypothyroidism: continue home levothyroxine  25mcg per tube daily Neuropathy: continue gabapentin  100mg  per tube QHS  FEN/GI: NPO, per tube only per RD recommendations PPx: N/A Dispo:SNF pending clinical improvement . Barriers include persistent bleeding, inability to sit up for HD.   Subjective:  Patient was seen and examined at bedside. Daughter is at bedside. Patient is able to shake his head no when asked if in any pain. Daughter has no concerns at this time. She is agreeable to a team meeting this morning with medical and palliative together.   Objective: Temp:  [97.7 F (36.5 C)-99.6 F (37.6 C)] 98.1 F (36.7 C) (01/22 0731) Pulse Rate:  [86-97] 88 (01/22 0731) Resp:  [18] 18 (01/22 0418) BP: (109-131)/(46-54) 125/54 (01/22 0731) SpO2:  [99 %-100 %] 100 % (01/22 0731) Weight:  [60.8 kg] 60.8 kg (01/22 0704) Physical Exam: General: cachectic, chronically ill-appearing male lying supine in hospital bed, in no acute distress Eyes: Nonicteric, EOMI ENTM: Moist mucous membranes, partially edentulous without top teeth Cardiovascular: Distant heart sounds, 2+ radial pulses Respiratory: normal WOB on RA Gastrointestinal: Soft, mild diffuse tenderness with mild guarding, nondistended, G tube in place, BS present MSK: Obvious muscle  wasting Derm: No rashes Neuro: Able to grunt y/n to questions and follow simple commands such as pointing   Laboratory: Most recent CBC Lab Results  Component Value Date   WBC 8.8 01/08/2025   HGB 6.7 (LL) 01/08/2025   HCT 20.3 (L) 01/08/2025   MCV 91.4 01/08/2025   PLT 63 (L) 01/08/2025   Most recent BMP    Latest Ref Rng & Units 01/08/2025    3:32 AM  BMP  Glucose 70 - 99 mg/dL 896   BUN 8 - 23 mg/dL 890   Creatinine 9.38 - 1.24 mg/dL 7.36   Sodium 864 - 854 mmol/L 131   Potassium 3.5 - 5.1 mmol/L 5.7   Chloride 98 - 111 mmol/L 92   CO2 22 - 32 mmol/L 26   Calcium  8.9 - 10.3 mg/dL 8.8     Lupie Credit, DO 01/08/2025, 7:52 AM  PGY-1, [facility] FPTS Intern pager: [phone], text pages welcome Secure chat group [facility] Teaching Service   "

## 2025-01-08 NOTE — Assessment & Plan Note (Addendum)
 Likely ESRD however nephrology monitoring for signs of renal recovery. --Recommendations per nephrology:  --Continue TTS HD schedule  --Daily RFP, Mg  --Strict I&O -- Avoid nephrotoxic medications

## 2025-01-08 NOTE — Progress Notes (Addendum)
 "                                                                                                                                                         Daily Progress Note   Patient Name: Ricardo [name]       Date: 01/08/2025 DOB: 1951-10-16  Age: 74 y.o. MRN#: [id number] Attending Physician: Madelon Donald HERO, DO Primary Care Physician: [facility] [place] Admit Date: 01/01/2025  Reason for Consultation/Follow-up: Establishing goals of care  Subjective: Patient and daughter present at bedside for a family meeting with PMT and FMTS. Created space and opportunity for patient and family's thoughts and feelings on patient's current illness, exploring oncology updates and implications in particular.  Both patient and daughter do not feel that this is surprising, as there have been workups and concerns for progressive myelofibrosis in the past as well.  Daughter shares that she has been preparing herself for any possible news since July when all of this started.  Her main priority is making sure that he is okay and as comfortable as possible while pursuing the interventions that he is interested in pursuing.  I was present for support while Dr. Lafe provided medical update.  Patient and daughter understand the expectation that he may never be strong enough to sit up for dialysis.  They also understand that this means he would not be able to discharge from the hospital to outpatient dialysis.  Reviewed that wound care is to prevent worsening and that it is unlikely to heal.  Patient shed a tear during our conversation, but stated it will all be all right.  Emotional support and therapeutic listening was provided.  Reviewed goals of care again, sharing my concern that further escalation of care would cause greater harm than benefit.  Patient's daughter attempted to explore his feelings on this, but he did not feel ready to make a decision or set a boundary at this time.  She will support him as he  processes the information and makes his decision when he is ready.  Questions and concerns addressed. PMT will continue to support holistically.   Objective: Medical records reviewed including oncology consult note 1/21, plan of care notes from FMTS 1/22 and 1/21, urology progress note 1/22.  Discussed with primary team prior to family meeting.  Concern for patient's myelofibrosis progressing and poor prognosis from ongoing blood loss, anemia, debility.  Physical Exam Vitals and nursing note reviewed.  Constitutional:      General: He is not in acute distress.    Appearance: He is ill-appearing.     Comments: Appears more awake today and staying engaged for longer time periods with daughter present  HENT:     Head: Normocephalic and atraumatic.  Cardiovascular:     Rate and Rhythm: Normal rate.  Pulmonary:  Effort: Pulmonary effort is normal. No tachypnea or respiratory distress.  Skin:    General: Skin is warm and dry.  Neurological:     Mental Status: He is alert. Mental status is at baseline.  Psychiatric:        Speech: Speech is slurred.        Behavior: Behavior normal.     Comments: Difficult to understand speech            Vital Signs: BP (!) 125/54 (BP Location: Left Arm)   Pulse 88   Temp 98.1 F (36.7 C) (Oral)   Resp 18   Ht 6' (1.829 m)   Wt 60.8 kg   SpO2 100%   BMI 18.17 kg/m  SpO2: SpO2: 100 % O2 Device: O2 Device: Room Air O2 Flow Rate:        Palliative Assessment/Data: 30% (on tube feeds)   Palliative Care Assessment & Plan   Patient Profile: 74 y.o. 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    [facility]  [facility] cared for since prolonged admission at [facility] and discharge on 10/01/2024.  He was seen several times by inpatient palliative team there.  Patient's daughter reports concern with ongoing hematuria and insufficient wound care despite her continued attempts to advocate for patient's needs.  In the ED, he received PRBC, also received 2 units in dialysis.  GI consulted and suspect bleeding is from ischemic colitis/hypotension.   PMT has been consulted to assist with goals of care conversation. After initial discussion, decision was made to proceed with hospitalization and escalate to ICU if indicated, with NO tracheostomy as a limitation. Follow up on 1/19 to ensure care plan remains within patient and family's previously stated goals on 1/16.  Assessment: Goals of care conversation  Chronic osteomyelitis and sacral wounds Debility and deconditioning Myelofibrosis, likely progressive Hematuria  Recommendations/Plan: Continue DNR/DNI Continue current care plan. Goals of care are  to treat the treatable and continue life-prolonging measures.  Patient is not ready to set additional boundaries or limitations to care after today's meeting.  Patient's daughter is supportive of his wishes Ongoing GOC discussions and palliative support.  Will see again next week.  Please secure chat or call team line if urgent needs arise in the meantime   Prognosis: Poor long-term prognosis  Discharge Planning: To Be Determined  Care plan was discussed with patient, patient's daughter, FMTS providers         Anhad Sheeley SHAUNNA Fell, PA-C  Palliative Medicine Team Team phone # [phone]  Thank you for allowing the Palliative Medicine Team to assist in the care of this patient. Please utilize secure chat with additional questions, if there is no response within 30 minutes please call the above phone number.  Palliative Medicine Team providers are available by phone from 7am to 7pm daily and can  be reached through the team cell phone.  Should this patient require assistance outside of these hours, please call the patient's attending physician.   I personally spent a total of 50 minutes in the care of the patient today including preparing to see the patient, getting/reviewing separately obtained history, performing a medically appropriate exam/evaluation, counseling and educating, referring and communicating with other health  care professionals, and documenting clinical information in the EHR.  "

## 2025-01-08 NOTE — Assessment & Plan Note (Addendum)
 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  Unable to collect accurate UA on admission due to significant hematuria which continues. -- Appreciate ongoing urology recommendations -- urology d/c foley 01/18 -- paraphimosis reduced 01/18 -- 39f foley placed 01/20  -- Consider IV TXA: Okay by heme-onc, plan for risk-benefit discussion with patient daughter 01/23  -- If bleeding continues and can be cleared for anesthesia, consider cystoscopy with fulguration -- further irrigation as appropriate

## 2025-01-09 ENCOUNTER — Inpatient Hospital Stay (HOSPITAL_COMMUNITY)

## 2025-01-09 DIAGNOSIS — L89150 Pressure ulcer of sacral region, unstageable: Secondary | ICD-10-CM | POA: Diagnosis not present

## 2025-01-09 DIAGNOSIS — E43 Unspecified severe protein-calorie malnutrition: Secondary | ICD-10-CM | POA: Diagnosis not present

## 2025-01-09 DIAGNOSIS — N368 Other specified disorders of urethra: Secondary | ICD-10-CM | POA: Diagnosis not present

## 2025-01-09 DIAGNOSIS — R532 Functional quadriplegia: Secondary | ICD-10-CM | POA: Diagnosis not present

## 2025-01-09 DIAGNOSIS — Z931 Gastrostomy status: Secondary | ICD-10-CM | POA: Diagnosis not present

## 2025-01-09 DIAGNOSIS — D471 Chronic myeloproliferative disease: Secondary | ICD-10-CM | POA: Diagnosis not present

## 2025-01-09 DIAGNOSIS — R310 Gross hematuria: Secondary | ICD-10-CM | POA: Diagnosis not present

## 2025-01-09 LAB — TYPE AND SCREEN
ABO/RH(D): O NEG
Antibody Screen: NEGATIVE
Unit division: 0
Unit division: 0
Unit division: 0
Unit division: 0

## 2025-01-09 LAB — BPAM RBC
Blood Product Expiration Date: 202601282359
Blood Product Expiration Date: 202602092359
Blood Product Unit Number: 202601282359
Blood Product Unit Number: 202602162359
ISSUE DATE / TIME: 202602162359
ISSUE DATE / TIME: 202601190445
ISSUE DATE / TIME: 202601192222
ISSUING PHYSICIAN: 202601192222
PRODUCT CODE: 202601211629
PRODUCT CODE: 202602162359
Unit Type and Rh: 9500
Unit Type and Rh: 202601221324
Unit Type and Rh: 202601221324
Unit Type and Rh: 9500
Unit Type and Rh: 202602162359
Unit Type and Rh: 9500
Unit Type and Rh: 9500

## 2025-01-09 LAB — GLUCOSE, CAPILLARY
Glucose-Capillary: 100 mg/dL — ABNORMAL HIGH (ref 70–99)
Glucose-Capillary: 100 mg/dL — ABNORMAL HIGH (ref 70–99)
Glucose-Capillary: 102 mg/dL — ABNORMAL HIGH (ref 70–99)
Glucose-Capillary: 103 mg/dL — ABNORMAL HIGH (ref 70–99)
Glucose-Capillary: 103 mg/dL — ABNORMAL HIGH (ref 70–99)
Glucose-Capillary: 118 mg/dL — ABNORMAL HIGH (ref 70–99)

## 2025-01-09 LAB — CBC
HCT: 21.9 % — ABNORMAL LOW (ref 39.0–52.0)
Hemoglobin: 7.5 g/dL — ABNORMAL LOW (ref 13.0–17.0)
MCH: 30.5 pg (ref 26.0–34.0)
MCHC: 34.2 g/dL (ref 30.0–36.0)
MCV: 89 fL (ref 80.0–100.0)
Platelets: 71 K/uL — ABNORMAL LOW (ref 150–400)
RBC: 2.46 MIL/uL — ABNORMAL LOW (ref 4.22–5.81)
RDW: 16.4 % — ABNORMAL HIGH (ref 11.5–15.5)
WBC: 8.5 K/uL (ref 4.0–10.5)
nRBC: 0.2 % (ref 0.0–0.2)

## 2025-01-09 LAB — HEMOGLOBIN AND HEMATOCRIT, BLOOD
HCT: 20.8 % — ABNORMAL LOW (ref 39.0–52.0)
Hemoglobin: 6.8 g/dL — CL (ref 13.0–17.0)

## 2025-01-09 LAB — COMPREHENSIVE METABOLIC PANEL WITH GFR
ALT: 23 U/L (ref 0–44)
AST: 42 U/L — ABNORMAL HIGH (ref 15–41)
Albumin: 2.4 g/dL — ABNORMAL LOW (ref 3.5–5.0)
Alkaline Phosphatase: 425 U/L — ABNORMAL HIGH (ref 38–126)
Anion gap: 13 (ref 5–15)
BUN: 101 mg/dL — ABNORMAL HIGH (ref 8–23)
CO2: 27 mmol/L (ref 22–32)
Calcium: 8.8 mg/dL — ABNORMAL LOW (ref 8.9–10.3)
Chloride: 92 mmol/L — ABNORMAL LOW (ref 98–111)
Creatinine, Ser: 2.3 mg/dL — ABNORMAL HIGH (ref 0.61–1.24)
GFR, Estimated: 30 mL/min — ABNORMAL LOW
Glucose, Bld: 101 mg/dL — ABNORMAL HIGH (ref 70–99)
Potassium: 5.3 mmol/L — ABNORMAL HIGH (ref 3.5–5.1)
Sodium: 132 mmol/L — ABNORMAL LOW (ref 135–145)
Total Bilirubin: 0.5 mg/dL (ref 0.0–1.2)
Total Protein: 6.6 g/dL (ref 6.5–8.1)

## 2025-01-09 LAB — RENAL FUNCTION PANEL
Albumin: 2.3 g/dL — ABNORMAL LOW (ref 3.5–5.0)
Anion gap: 11 (ref 5–15)
BUN: 73 mg/dL — ABNORMAL HIGH (ref 8–23)
CO2: 28 mmol/L (ref 22–32)
Calcium: 8.5 mg/dL — ABNORMAL LOW (ref 8.9–10.3)
Chloride: 93 mmol/L — ABNORMAL LOW (ref 98–111)
Creatinine, Ser: 1.74 mg/dL — ABNORMAL HIGH (ref 0.61–1.24)
GFR, Estimated: 41 mL/min — ABNORMAL LOW
Glucose, Bld: 101 mg/dL — ABNORMAL HIGH (ref 70–99)
Phosphorus: 2.6 mg/dL (ref 2.5–4.6)
Potassium: 4.4 mmol/L (ref 3.5–5.1)
Sodium: 132 mmol/L — ABNORMAL LOW (ref 135–145)

## 2025-01-09 LAB — MAGNESIUM
Magnesium: 1.8 mg/dL (ref 1.7–2.4)
Magnesium: 2 mg/dL (ref 1.7–2.4)

## 2025-01-09 LAB — PREPARE RBC (CROSSMATCH)

## 2025-01-09 LAB — LACTIC ACID, PLASMA: Lactic Acid, Venous: 3.1 mmol/L (ref 0.5–1.9)

## 2025-01-09 MED ORDER — OXYCODONE HCL 5 MG PO TABS
10.0000 mg | ORAL_TABLET | Freq: Four times a day (QID) | ORAL | Status: DC
  Administered 2025-01-09 – 2025-01-11 (×8): 10 mg
  Filled 2025-01-09 (×7): qty 2

## 2025-01-09 MED ORDER — CHLORHEXIDINE GLUCONATE CLOTH 2 % EX PADS
6.0000 | MEDICATED_PAD | Freq: Every day | CUTANEOUS | Status: DC
  Administered 2025-01-10 – 2025-01-11 (×2): 6 via TOPICAL

## 2025-01-09 NOTE — Plan of Care (Signed)
" °  Problem: Health Behavior/Discharge Planning: Goal: Ability to manage health-related needs will improve Outcome: Progressing   Problem: Clinical Measurements: Goal: Ability to maintain clinical measurements within normal limits will improve Outcome: Progressing Goal: Will remain free from infection Outcome: Progressing Goal: Respiratory complications will improve Outcome: Progressing Goal: Cardiovascular complication will be avoided Outcome: Progressing   Problem: Activity: Goal: Risk for activity intolerance will decrease Outcome: Progressing   Problem: Nutrition: Goal: Adequate nutrition will be maintained Outcome: Progressing   Problem: Coping: Goal: Level of anxiety will decrease Outcome: Progressing   Problem: Elimination: Goal: Will not experience complications related to bowel motility Outcome: Progressing Goal: Will not experience complications related to urinary retention Outcome: Progressing   Problem: Pain Managment: Goal: General experience of comfort will improve and/or be controlled Outcome: Progressing   Problem: Safety: Goal: Ability to remain free from injury will improve Outcome: Progressing   Problem: Skin Integrity: Goal: Risk for impaired skin integrity will decrease Outcome: Progressing   Problem: Education: Goal: Knowledge of disease and its progression will improve Outcome: Progressing Goal: Individualized Educational Video(s) Outcome: Progressing   Problem: Fluid Volume: Goal: Compliance with measures to maintain balanced fluid volume will improve Outcome: Progressing   Problem: Health Behavior/Discharge Planning: Goal: Ability to manage health-related needs will improve Outcome: Progressing   Problem: Nutritional: Goal: Ability to make healthy dietary choices will improve Outcome: Progressing   Problem: Clinical Measurements: Goal: Complications related to the disease process, condition or treatment will be avoided or  minimized Outcome: Progressing   Problem: Bowel/Gastric: Goal: Will show no signs and symptoms of gastrointestinal bleeding Outcome: Progressing   Problem: Fluid Volume: Goal: Will show no signs and symptoms of excessive bleeding Outcome: Progressing   Problem: Clinical Measurements: Goal: Complications related to the disease process, condition or treatment will be avoided or minimized Outcome: Progressing   "

## 2025-01-09 NOTE — Assessment & Plan Note (Addendum)
 Improved. --Recommendations per nephrology:  --Continue TTS HD schedule  --Daily RFP, Mg  --Strict I&O -- Avoid nephrotoxic medications

## 2025-01-09 NOTE — Assessment & Plan Note (Addendum)
 Severe sacral pressure ulcer with osteomyelitis and chronic pain, present on admission. Was on IV meropenem  and linezolid  at OSH, discontinued per ID as patient had completed sufficient course for indication and likely contribution to thrombocytopenia.   -- Risks of surgical debridement outweigh benefit per general surgery, signed off -- Turn patient over 2 hours while in bed, pressure relief every 15 minutes when in chair  -- Continue wound care per [facility] consult note 1/16 -- PT/OT eval and treat -Pain management as above

## 2025-01-09 NOTE — Assessment & Plan Note (Addendum)
 No evidence of acute GI bleed on CTAP 1/16, likely secondary to ischemic colitis per GI along with history of primary myelofibrosis, ESRD.  --GI recommendations as follows:  -- Avoid endoscopic procedure given risks outweighing benefits  -- Monitor hemoglobin q8-12 hours -- Heme/Onc consulted, appreciate recs -Multifactorial causes of anemia: GI bleeding, hematuria, myelofibrosis, end-stage renal disease -Recommend PRBC transfusion for hemoglobin <7 -Cleared to use TXA given GOC palliative, continue blood transfusions as needed --Platelet transfusion threshold <20 or uncontrolled bleeding per in house heme/onc provider consult -- Appreciate ongoing palliative discussions -- Continue tube feeds per RD recommendation  -- Continue IV Protonix  40 mg Daily  -- Pain control: Tylenol , oxycodone  10 mg scheduled -- Fall and delirium precautions

## 2025-01-09 NOTE — Progress Notes (Signed)
 Physical Therapy Treatment Patient Details Name: Ricardo [name] MRN: [id number] DOB: June 09, 1951 Today's Date: 01/09/2025   History of Present Illness 74 y.o. 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 Of note, admit 07/2024 for septic shock, requiring multiple intubations and PEG placement; d/c to [facility] 10/01/24 complicated by sacral ulcer, osteomyelitis, E. Coli peritonitis, encephalopathy. Other PMH includes JAK2+ primary myelofibrosis (transfusion dependent follows with heme/onc at the [place]), prostate cancer, COPD, A-fib, insomnia, gout, ESRD on HD.    PT Comments  Pt admitted with above diagnosis. Pt only able to perform UE and LE exercises today due to nurse states that too much movement causes catheter to clot. Pt able to assist with ROM all 4's.  Will continue to follow acutely.  Pt currently with functional limitations due to the deficits listed below (see PT Problem List). Pt will benefit from acute skilled PT to increase their independence and safety with mobility to allow discharge.       If plan is discharge home, recommend the following: Two people to help with walking and/or transfers;Assistance with cooking/housework;Two people to help with bathing/dressing/bathroom;Assistance with feeding;Assist for transportation;Help with stairs or ramp for entrance;Supervision due to cognitive status   Can travel by private vehicle     No  Equipment Recommendations  Hospital bed;Hoyer lift (TBD)    Recommendations for Other Services       Precautions / Restrictions Precautions Precautions: Fall;Other (comment) Recall of Precautions/Restrictions: Impaired Precaution/Restrictions Comments: large sacral ulcer, foley with Continous Bladder Irrigation, PEG tube Restrictions Weight Bearing Restrictions Per Provider Order: No     Mobility  Bed Mobility               General bed mobility comments: Nurse asked for  exercises only as movement causes the catheter to clot. Bed level positioning and exercises only    Transfers                   General transfer comment: did not attempt to sit EOB or lift transfer to recliner given nurse asked to not move pt much due to bladder irrigation    Ambulation/Gait               General Gait Details: unable   Stairs             Wheelchair Mobility     Tilt Bed    Modified Rankin (Stroke Patients Only)       Balance                                            Communication Communication Communication: Impaired Factors Affecting Communication: Difficulty expressing self;Reduced clarity of speech  Cognition Arousal: Alert Behavior During Therapy: Flat affect   PT - Cognitive impairments: Difficult to assess, Sequencing, Awareness Difficult to assess due to: Impaired communication                     PT - Cognition Comments: pt requiring intermittent cues to attend to task; pt speaking when asked questions but speech very difficult to understand; difficulty making needs known at times Following commands: Impaired Following commands impaired: Follows one step commands with increased time, Follows one step commands inconsistently    Cueing Cueing Techniques: Verbal cues, Tactile cues, Visual cues  Exercises General Exercises - Upper Extremity Shoulder  Flexion: AAROM, Both, 5 reps, Supine Shoulder Horizontal ADduction: AROM, Both, 5 reps, Supine Elbow Flexion: AAROM, Both, 5 reps Elbow Extension: AAROM, Both, 5 reps Digit Composite Flexion: Both, 5 reps, AAROM    General Comments        Pertinent Vitals/Pain Pain Assessment Pain Assessment: Faces Faces Pain Scale: Hurts even more Breathing: normal Negative Vocalization: none Facial Expression: sad, frightened, frown Body Language: relaxed Consolability: no need to console PAINAD Score: 1 Pain Location: sacral wound with  repositioning Pain Descriptors / Indicators: Grimacing Pain Intervention(s): Limited activity within patient's tolerance, Monitored during session, Repositioned    Home Living                          Prior Function            PT Goals (current goals can now be found in the care plan section) Acute Rehab PT Goals Patient Stated Goal: unable to state Progress towards PT goals: Not progressing toward goals - comment (Pt with minimal participation and limited due to bladder issues today)    Frequency    Min 1X/week      PT Plan      Co-evaluation              AM-PAC PT 6 Clicks Mobility   Outcome Measure  Help needed turning from your back to your side while in a flat bed without using bedrails?: Total Help needed moving from lying on your back to sitting on the side of a flat bed without using bedrails?: Total Help needed moving to and from a bed to a chair (including a wheelchair)?: Total Help needed standing up from a chair using your arms (e.g., wheelchair or bedside chair)?: Total Help needed to walk in hospital room?: Total Help needed climbing 3-5 steps with a railing? : Total 6 Click Score: 6    End of Session   Activity Tolerance: Patient limited by fatigue;Patient limited by pain Patient left: in bed;with call bell/phone within reach;with bed alarm set Nurse Communication: Mobility status;Need for lift equipment PT Visit Diagnosis: Muscle weakness (generalized) (M62.81);Other abnormalities of gait and mobility (R26.89)     Time: 1123-1140 PT Time Calculation (min) (ACUTE ONLY): 17 min  Charges:    $Therapeutic Exercise: 8-22 mins PT General Charges $$ ACUTE PT VISIT: 1 Visit                     Weaver Tweed M,PT Acute Rehab Services [phone]    Ricardo [name] 01/09/2025, 1:21 PM

## 2025-01-09 NOTE — Progress Notes (Signed)
 " [facility] Progress Note   Subjective:    Pt seen in room Opens eyes to voice  Objective Vitals:   01/09/25 0154 01/09/25 0358 01/09/25 0702 01/09/25 0713  BP: (!) 136/52 121/84  (!) 123/51  Pulse: 99 97  99  Resp: 18 18  18   Temp: 98.4 F (36.9 C) 98.8 F (37.1 C)  98 F (36.7 C)  TempSrc:    Oral  SpO2: 98% 98%  98%  Weight:   61 kg   Height:       Physical Exam General: chronically ill appearing, very frail male in NAD Heart: RRR, no murmurs Lungs: Clear anteriorly, respirations unlabored Abdomen: Soft and non-distended Extremities: No edema b/l lower extremities Dialysis Access:  TDC  Last 2 HD sessions here ->  3h  0- 1 L UF  TDC  Heparin  low dose  Bun 40-100 here, creat 1.6- 3.2    Assessment/Plan:  Dialysis dependent AKI  -has been on renal replacement therapy since August 2025 (since [facility] to [facility]), had been doing HD TTS at [facility] - may be deemed as ESRD - some UOP but not enough to stop dialysis at this time - HD TTS schedule here  - next HD Sat   Anemia of Chronic Kidney Disease ABLA, GI Bleed Transfusion dependent myelofibrosis -transfuse prn for hgb <7 -GI following--conservative mgmt for now   Hematuria -intermittent issue, h/o prostate Ca per chart -urology following -s/p irrigation with urology 1/16 -s/p foley catheter placement today by Urology -per Urology    Chronic osteomyelitis -on meropenem  and linezolid   until 01/31/24 -per primary   Severe protein-calorie malnutirion -with PEG -per primary   Secondary hyperparathyroidism -phos and calcium  at goal   GOC - palliative following - would need to dialyze in a chair 3.5- 4 hrs here before dc to SNF - I asked PT about HD in chair -> they think it his highly unlikely that he would tolerate HD in the chair, due to combination of severe debility (needs total assist to roll over) and the sacral wound    Dispo -Monitoring for renal recovering but more likely  ESRD   Myer Fret  MD  [facility] 01/09/2025, 4:15 PM  Recent Labs  Lab 01/08/25 0332 01/08/25 1911 01/09/25 0604  HGB 6.7* 7.9* 7.5*  ALBUMIN 2.3*  --  2.3*  CALCIUM  8.8*  --  8.5*  PHOS 4.3  --  2.6  CREATININE 2.63*  --  1.74*  K 5.7*  --  4.4    Inpatient medications:  sodium chloride    Intravenous Once   acetaminophen   1,000 mg Per Tube Q6H   Or   acetaminophen   650 mg Rectal Q6H   Chlorhexidine  Gluconate Cloth  6 each Topical Q0600   feeding supplement (PROSource TF20)  60 mL Per Tube BID   free water   30 mL Per Tube Q2H   gabapentin   100 mg Per Tube QHS   latanoprost   1 drop Both Eyes QHS   levothyroxine   25 mcg Per Tube Q0600   lidocaine   2 patch Transdermal Q24H   metoprolol  tartrate  12.5 mg Per Tube BID   midodrine   10 mg Per Tube TID WC   multivitamin  1 tablet Per Tube QHS   nutrition supplement (JUVEN)  1 packet Per Tube BID BM   mouth rinse  15 mL Mouth Rinse 4 times per day   oxyCODONE   10 mg Per Tube Q6H   pantoprazole  (PROTONIX ) IV  40 mg  Intravenous Q12H   sertraline   50 mg Per Tube Daily    feeding supplement (JEVITY 1.5 CAL/FIBER) 65 mL/hr at 01/09/25 1427   sodium chloride  irrigation     mouth rinse        "

## 2025-01-09 NOTE — Assessment & Plan Note (Addendum)
 H/o metabolic encephalopathy and insomnia: continue home sertraline  50mg  per tube daily; holding home trazodone  100mg  daily Hypotension: continue home midodrine  10mg  per tube TID Hypothyroidism: continue home levothyroxine  per tube daily Neuropathy: continue gabapentin  100mg  per tube QHS

## 2025-01-09 NOTE — Assessment & Plan Note (Addendum)
 PEG feeds recommendations per RD - Continue TF via G-tube  - Starting Jevity 1.5 at 25 ml/hr, advance by 10ml every 4 hrs to goal of 65 ml/hr ( 1,560 ml per day)  - 60 ml ProSource TF BID - Flush 30mL q2hr -MVI tablet daily - Juven BID - Strict NPO, per tube only

## 2025-01-09 NOTE — Assessment & Plan Note (Addendum)
 Anticoagulation inappropriate given high risk of bleed. Unfortunately DBP dropping. Expect will need to gradually discontinue all BP/HR medications.  -- Decrease metoprolol  to 12.5mg  BID  -- Continue to hold home diltiazem  90mg , reassess need if HR increases

## 2025-01-09 NOTE — Assessment & Plan Note (Addendum)
 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  Unable to collect accurate UA on admission due to significant hematuria which continues with large clots. -- Appreciate ongoing urology recommendations -- paraphimosis reduced 01/18 -- 35f foley placed 01/20  -- Consider IV TXA: Okay by heme-onc, plan for risk-benefit discussion with patient daughter   -- If bleeding continues and can be cleared for anesthesia, consider cystoscopy with fulguration -- further irrigation as appropriate

## 2025-01-09 NOTE — TOC Progression Note (Signed)
 Transition of Care [facility]) - Progression Note    Patient Details  Name: Ricardo [name] MRN: [id number] Date of Birth: 10/22/1951  Transition of [facility]) CM/SW Contact  Lendia Dais, [place] Phone Number: 01/09/2025, 8:41 AM  Clinical Narrative:  Pt is not medically stable for discharge due to persistent GI and GU bleeding requiring transfusions. Palliative is following and discussing GOC.   Pt remain inappropriate for OP HD due to not be able to tolerate sitting in HD chair with sacral wounds. Pt is from [facility] but cannot return due to being out of LTACH medicare days and secondary insurance cannot cover LTACH. Pt is non-[facility], therefore cannot provide coverage for [facility].  Pt has received to bed offers for SNF.  CSW will continue to follow.                     Expected Discharge Plan and Services                                               Social Drivers of Health (SDOH) Interventions SDOH Screenings   Food Insecurity: No Food Insecurity (01/02/2025)  Housing: Low Risk (01/02/2025)  Transportation Needs: Patient Unable To Answer (01/06/2025)   Received from [facility]  Utilities: Not At Risk (01/02/2025)  Financial Resource Strain: Low Risk  (10/02/2024)   Received from [facility]  Social Connections: Patient Unable To Answer (01/02/2025)  Stress: No Stress Concern Present (10/01/2024)   Received from [facility]  Recent Concern: Stress - Stress Concern Present (08/03/2024)   Received from [facility]  Tobacco Use: High Risk (01/02/2025)    Readmission Risk Interventions    07/08/2024   10:16 AM 07/07/2024   11:05 AM  Readmission Risk Prevention Plan  Transportation Screening Complete Complete  Home Care Screening Complete Complete  Medication Review (RN CM) Complete Complete

## 2025-01-09 NOTE — Progress Notes (Signed)
 At beginning of shift, 1000 cc of dark red urine.  Patient has Bladder Irrigation hanging.  Assessment of patient less than an hour later, shows no movement of fluid from the bladder irrigation bag.  Patient abdomen appears more distended.  Minimal out put in the foley bag.  Able to do bladder irrigation with irrigation syringe.  Multiple attempts during the next couple of hours to irrigate Foley.  Multiple dark red clots removed from catheter but still minimal instillation of irrigant.  Some clots were as large at 2 x 2 gauze.  While attempting to flush the catheter, the patient would have blood ooze from the tip of the penis.  Bladder scans showed 0 cc and 3 cc.  Family Medicine Resident made aware and came to bedside to assess patient.  Patient was in no apparent distress and VS remained stable.  Urology consulted and Dr. Selma came to bedside to irrigate with success.  Suzen Ice RN

## 2025-01-09 NOTE — Progress Notes (Signed)
 Notified pt catheter obstructed after multiple attempts at RN to irrigate.  Assessed pt at bedside. Pt with suprapubic discomfort and catheter not draining. Irrigated using 1L saline and evacuated about 25cc clot. Irrigates easily. Pt tolerated well and feels much better. Restarted CBI on moderate drip with clear effluent.  Discussed with RN to manually irrigate as needed. Wean CBI as able.  Following.  Matt R. Zona Pedro MD [facility]  Pager: [phone]

## 2025-01-09 NOTE — Progress Notes (Incomplete)
 Ricardo [name]

## 2025-01-09 NOTE — Progress Notes (Signed)
 "    Daily Progress Note Intern Pager: [phone]  Patient name: Ricardo [name] Medical record number: [id number] Date of birth: 1951-09-05 Age: 74 y.o. Gender: male  Primary Care Provider: [facility] [place] Consultants: GI, urology, palliative Code Status: DNR/DNI   Pt Overview and Major Events to Date:  01/15: Admitted for GI bleed   Assessment and Plan:   Ricardo [name] is a 74 year old male admitted for GI bleed.  He was transferred from [facility], where he has resided for 3 months due to sepsis complicated by sacral pressure ulcer and osteomyelitis, E. coli peritonitis and encephalopathy. Hospital course remains complicated by persistent GI and GU bleeding requiring transfusions.   Last night, patient's catheter became obstructed with a large blood clot after multiple attempts at irrigation and urology was consulted for manual irrigation.   Appreciate continuing goals of care discussions with Palliative and daughter.  Assessment & Plan Primary myelofibrosis (HCC) Ischemic colitis No evidence of acute GI bleed on CTAP 1/16, likely secondary to ischemic colitis per GI along with history of primary myelofibrosis, ESRD.  --GI recommendations as follows:  -- Avoid endoscopic procedure given risks outweighing benefits  -- Monitor hemoglobin q8-12 hours -- Heme/Onc consulted, appreciate recs -Multifactorial causes of anemia: GI bleeding, hematuria, myelofibrosis, end-stage renal disease -Recommend PRBC transfusion for hemoglobin <7 -Cleared to use TXA given GOC palliative, continue blood transfusions as needed --Platelet transfusion threshold <20 or uncontrolled bleeding per in house heme/onc provider consult -- Appreciate ongoing palliative discussions -- Continue tube feeds per RD recommendation  -- Continue IV Protonix  40 mg Daily  -- Pain control: Tylenol , oxycodone  10 mg scheduled -- Fall and delirium precautions Hematuria 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  Unable to collect accurate UA on admission due to significant hematuria which continues with large clots. -- Appreciate ongoing urology recommendations -- paraphimosis reduced 01/18 -- 44f foley placed 01/20  -- Consider IV TXA: Okay by heme-onc, plan for risk-benefit discussion with patient daughter   -- If bleeding continues and can be cleared for anesthesia, consider cystoscopy with fulguration -- further irrigation as appropriate Hyperkalemia (Resolved: 01/09/2025)  Dialysis dependent AKI Improved. 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 Was on IV meropenem  and linezolid  at OSH, discontinued per ID as patient had completed sufficient course for indication and likely contribution to thrombocytopenia.   -- Risks of surgical debridement outweigh benefit per general surgery, signed off -- Turn patient over 2 hours while in bed, pressure relief every 15 minutes when in chair  -- Continue wound care per [facility] consult note 1/16 -- PT/OT eval and treat -Pain management as above Atrial fibrillation (HCC) Anticoagulation inappropriate given high risk of bleed. Unfortunately DBP dropping. Expect will need to gradually discontinue all BP/HR medications.  -- Decrease metoprolol  to 12.5mg  BID  -- Continue to hold home diltiazem  90mg , reassess need if HR increases Severe protein-calorie malnutrition PEG (percutaneous endoscopic gastrostomy) status (HCC) Underweight (BMI < 18.5) PEG feeds recommendations per RD - Continue TF via G-tube  - Starting Jevity 1.5 at 25 ml/hr, advance by 10ml every 4 hrs to goal of 65 ml/hr (  1,560 ml per day)  - 60 ml ProSource TF BID - Flush 30mL  q2hr -MVI tablet daily - Juven BID - Strict NPO, per tube only Chronic health problem H/o metabolic encephalopathy and insomnia: continue home sertraline  50mg  per tube daily; holding home trazodone  100mg  daily Hypotension: continue home midodrine  10mg  per tube TID Hypothyroidism: continue home levothyroxine  25mcg per tube daily Neuropathy: continue gabapentin  100mg  per tube QHS  FEN/GI: Strict NPO, per tube feeds only PPx: N/A Dispo:SNF pending clinical improvement . Barriers include inability to sit up for HD.   Subjective:  Patient was seen and examined at bedside.  He is able to use his right hand to gesture to the left side of his ribs to tell me that he is in pain.  Bloody hand towel sitting between legs under genitals, no leakage appreciated from Foley.  Objective: Temp:  [97.8 F (36.6 C)-99.2 F (37.3 C)] 98 F (36.7 C) (01/23 0713) Pulse Rate:  [88-109] 99 (01/23 0713) Resp:  [18-29] 18 (01/23 0713) BP: (105-144)/(44-84) 123/51 (01/23 0713) SpO2:  [95 %-100 %] 98 % (01/23 0713) Weight:  [60.8 kg-61.4 kg] 61.4 kg (01/22 1611) Physical Exam: General: Cachectic, chronically ill-appearing male lying supine in hospital bed, in no acute distress Cardiovascular: RRR, no M/R/G, 2+ radial pulses Respiratory: Normal work of breathing on room air, good air movement throughout lung fields, upper airway transmitted noises Abdomen: Soft, mildly tender around G-tube, bowel sounds present Extremities: Obvious muscle wasting Derm: No rashes Neuro: Able to grunt y/n to questions and follow simple commands such as pointing   Laboratory: Most recent CBC Lab Results  Component Value Date   WBC 8.5 01/09/2025   HGB 7.5 (L) 01/09/2025   HCT 21.9 (L) 01/09/2025   MCV 89.0 01/09/2025   PLT 71 (L) 01/09/2025   Most recent BMP    Latest Ref Rng & Units 01/09/2025    6:04 AM  BMP  Glucose 70 - 99 mg/dL 898   BUN 8 - 23 mg/dL  73   Creatinine 9.38 - 1.24 mg/dL 8.25   Sodium 864 - 854 mmol/L 132   Potassium 3.5 - 5.1 mmol/L 4.4   Chloride 98 - 111 mmol/L 93   CO2 22 - 32 mmol/L 28   Calcium  8.9 - 10.3 mg/dL 8.5    Lupie Credit, DO 01/09/2025, 7:25 AM  PGY-1, [facility] FPTS Intern pager: [phone], text pages welcome Secure chat group [facility] Teaching Service   "

## 2025-01-09 NOTE — Assessment & Plan Note (Deleted)
 5.7 this morning in the setting of ESRD. Expect to decrease with dialysis however will treat with Lokelma  as well x1.  - One time Lokelma  10mg  per tube

## 2025-01-10 DIAGNOSIS — R532 Functional quadriplegia: Secondary | ICD-10-CM | POA: Diagnosis not present

## 2025-01-10 DIAGNOSIS — K922 Gastrointestinal hemorrhage, unspecified: Secondary | ICD-10-CM | POA: Diagnosis not present

## 2025-01-10 DIAGNOSIS — R310 Gross hematuria: Secondary | ICD-10-CM | POA: Diagnosis not present

## 2025-01-10 DIAGNOSIS — Z931 Gastrostomy status: Secondary | ICD-10-CM | POA: Diagnosis not present

## 2025-01-10 DIAGNOSIS — M8660 Other chronic osteomyelitis, unspecified site: Secondary | ICD-10-CM | POA: Diagnosis not present

## 2025-01-10 DIAGNOSIS — D471 Chronic myeloproliferative disease: Secondary | ICD-10-CM | POA: Diagnosis not present

## 2025-01-10 DIAGNOSIS — R7881 Bacteremia: Secondary | ICD-10-CM | POA: Insufficient documentation

## 2025-01-10 DIAGNOSIS — N368 Other specified disorders of urethra: Secondary | ICD-10-CM | POA: Diagnosis not present

## 2025-01-10 DIAGNOSIS — E43 Unspecified severe protein-calorie malnutrition: Secondary | ICD-10-CM | POA: Diagnosis not present

## 2025-01-10 LAB — BLOOD CULTURE ID PANEL (REFLEXED) - BCID2

## 2025-01-10 LAB — RENAL FUNCTION PANEL
Albumin: 2.2 g/dL — ABNORMAL LOW (ref 3.5–5.0)
Albumin: 2.4 g/dL — ABNORMAL LOW (ref 3.5–5.0)
Anion gap: 11 (ref 5–15)
Anion gap: 11 (ref 5–15)
BUN: 109 mg/dL — ABNORMAL HIGH (ref 8–23)
BUN: 111 mg/dL — ABNORMAL HIGH (ref 8–23)
CO2: 26 mmol/L (ref 22–32)
CO2: 28 mmol/L (ref 22–32)
Calcium: 8.7 mg/dL — ABNORMAL LOW (ref 8.9–10.3)
Calcium: 8.7 mg/dL — ABNORMAL LOW (ref 8.9–10.3)
Chloride: 92 mmol/L — ABNORMAL LOW (ref 98–111)
Chloride: 92 mmol/L — ABNORMAL LOW (ref 98–111)
Creatinine, Ser: 2.47 mg/dL — ABNORMAL HIGH (ref 0.61–1.24)
Creatinine, Ser: 2.5 mg/dL — ABNORMAL HIGH (ref 0.61–1.24)
GFR, Estimated: 27 mL/min — ABNORMAL LOW
GFR, Estimated: 27 mL/min — ABNORMAL LOW
Glucose, Bld: 106 mg/dL — ABNORMAL HIGH (ref 70–99)
Glucose, Bld: 108 mg/dL — ABNORMAL HIGH (ref 70–99)
Phosphorus: 4.3 mg/dL (ref 2.5–4.6)
Phosphorus: 4.4 mg/dL (ref 2.5–4.6)
Potassium: 5.6 mmol/L — ABNORMAL HIGH (ref 3.5–5.1)
Potassium: 5.8 mmol/L — ABNORMAL HIGH (ref 3.5–5.1)
Sodium: 130 mmol/L — ABNORMAL LOW (ref 135–145)
Sodium: 131 mmol/L — ABNORMAL LOW (ref 135–145)

## 2025-01-10 LAB — CBC
HCT: 20.6 % — ABNORMAL LOW (ref 39.0–52.0)
HCT: 20.3 % — ABNORMAL LOW (ref 39.0–52.0)
Hemoglobin: 6.9 g/dL — CL (ref 13.0–17.0)
Hemoglobin: 6.8 g/dL — CL (ref 13.0–17.0)
MCH: 30.3 pg (ref 26.0–34.0)
MCH: 30.2 pg (ref 26.0–34.0)
MCHC: 33.5 g/dL (ref 30.0–36.0)
MCHC: 33.5 g/dL (ref 30.0–36.0)
MCV: 90.4 fL (ref 80.0–100.0)
MCV: 90.2 fL (ref 80.0–100.0)
Platelets: 76 10*3/uL — ABNORMAL LOW (ref 150–400)
Platelets: 79 10*3/uL — ABNORMAL LOW (ref 150–400)
RBC: 2.28 MIL/uL — ABNORMAL LOW (ref 4.22–5.81)
RBC: 2.25 MIL/uL — ABNORMAL LOW (ref 4.22–5.81)
RDW: 16.3 % — ABNORMAL HIGH (ref 11.5–15.5)
RDW: 16.5 % — ABNORMAL HIGH (ref 11.5–15.5)
WBC: 8.9 10*3/uL (ref 4.0–10.5)
WBC: 8.9 10*3/uL (ref 4.0–10.5)
nRBC: 0.2 % (ref 0.0–0.2)
nRBC: 0.2 % (ref 0.0–0.2)

## 2025-01-10 LAB — LACTIC ACID, PLASMA
Lactic Acid, Venous: 2.1 mmol/L (ref 0.5–1.9)
Lactic Acid, Venous: 3.3 mmol/L (ref 0.5–1.9)

## 2025-01-10 LAB — HEMOGLOBIN AND HEMATOCRIT, BLOOD
HCT: 24.4 % — ABNORMAL LOW (ref 39.0–52.0)
Hemoglobin: 8.3 g/dL — ABNORMAL LOW (ref 13.0–17.0)

## 2025-01-10 LAB — GLUCOSE, CAPILLARY
Glucose-Capillary: 103 mg/dL — ABNORMAL HIGH (ref 70–99)
Glucose-Capillary: 115 mg/dL — ABNORMAL HIGH (ref 70–99)
Glucose-Capillary: 89 mg/dL (ref 70–99)
Glucose-Capillary: 93 mg/dL (ref 70–99)

## 2025-01-10 LAB — MAGNESIUM: Magnesium: 1.9 mg/dL (ref 1.7–2.4)

## 2025-01-10 LAB — PREPARE RBC (CROSSMATCH)

## 2025-01-10 MED ORDER — ALTEPLASE 2 MG IJ SOLR: 2.0000 mg | Freq: Once | INTRAMUSCULAR | Status: DC | PRN

## 2025-01-10 MED ORDER — PENTAFLUOROPROP-TETRAFLUOROETH EX AERO: 1.0000 | INHALATION_SPRAY | CUTANEOUS | Status: DC | PRN

## 2025-01-10 MED ORDER — LIDOCAINE-PRILOCAINE 2.5-2.5 % EX CREA: 1.0000 | TOPICAL_CREAM | CUTANEOUS | Status: DC | PRN

## 2025-01-10 MED ORDER — ANTICOAGULANT SODIUM CITRATE 4% (200MG/5ML) IV SOLN: 5.0000 mL | Status: DC | PRN

## 2025-01-10 MED ORDER — HEPARIN SODIUM (PORCINE) 1000 UNIT/ML DIALYSIS: 1500.0000 [IU] | INTRAMUSCULAR | Status: DC | PRN

## 2025-01-10 MED ORDER — HEPARIN SODIUM (PORCINE) 1000 UNIT/ML IJ SOLN
INTRAMUSCULAR | Status: AC
  Filled 2025-01-10: qty 4

## 2025-01-10 MED ORDER — SODIUM CHLORIDE 0.9% IV SOLUTION: Freq: Once | INTRAVENOUS | Status: DC

## 2025-01-10 MED ORDER — OXYCODONE HCL 5 MG PO TABS
5.0000 mg | ORAL_TABLET | Freq: Once | ORAL | Status: AC
  Administered 2025-01-11: 5 mg
  Filled 2025-01-10: qty 1

## 2025-01-10 MED ORDER — MIDODRINE HCL 5 MG PO TABS
ORAL_TABLET | ORAL | Status: AC
  Filled 2025-01-10: qty 2

## 2025-01-10 MED ORDER — HEPARIN SODIUM (PORCINE) 1000 UNIT/ML DIALYSIS: 2000.0000 [IU] | Freq: Once | INTRAMUSCULAR | Status: DC

## 2025-01-10 MED ORDER — OXYCODONE HCL 5 MG PO TABS
ORAL_TABLET | ORAL | Status: AC
  Filled 2025-01-10: qty 2

## 2025-01-10 MED ORDER — HEPARIN SODIUM (PORCINE) 1000 UNIT/ML DIALYSIS
1000.0000 [IU] | INTRAMUSCULAR | Status: DC | PRN
  Administered 2025-01-10: 4000 [IU]
  Filled 2025-01-10: qty 1

## 2025-01-10 MED ORDER — SODIUM CHLORIDE 0.9 % IV SOLN
2.0000 g | INTRAVENOUS | Status: DC
  Administered 2025-01-10: 2 g via INTRAVENOUS
  Filled 2025-01-10: qty 12.5

## 2025-01-10 MED ORDER — OXYCODONE HCL 5 MG PO TABS: 10.0000 mg | ORAL_TABLET | Freq: Once | ORAL | Status: DC

## 2025-01-10 MED ORDER — LIDOCAINE HCL (PF) 1 % IJ SOLN: 5.0000 mL | INTRAMUSCULAR | Status: DC | PRN

## 2025-01-10 NOTE — Assessment & Plan Note (Signed)
 Ongoing for past few months, likely 2/2 radiation cystitis from hx of prostate cancer.  - Urology following, appreciate recs. In brief, voiding trial 1/18 with return of hematuria three-way coud placed 1/19, failed to improve with integration.  CBI restarted 1/20.  - Considering IV TXA- will need to decide risk/benefit with family  - will need to contact urology once decision is made  - paraphimosis reduced 01/18

## 2025-01-10 NOTE — Assessment & Plan Note (Signed)
 Patient with hx of severe sacral pressure ulcer w osteomyelitis and hx of sepsis. Was treated at OSH with IV meropenem  and linezolid  which was discontinued per ID as patient had sufficient course for indication. -Surgery consulted, risk of surgical debridement outweigh benefit -Turn patient every 2 hours -Wound care per [facility] consult -PT/OT following -Pain management: Tylenol  1000 mg every 6 hours, oxycodone  10 mg every 6 hours

## 2025-01-10 NOTE — Progress Notes (Signed)
 Received patient in bed to unit.  Alert and oriented.  Informed consent signed and in chart.   TX duration: 3 hours  Patient tolerated well.  Transported back to the room  Alert, without acute distress.  Hand-off given to patient's nurse.   Access used: R HD cath chest Access issues: none  Total UF removed: 1.2L Low goal met Medication(s) given: Tylenol , oxycodone , midodrine    01/10/25 1200  Vitals  Temp 98.2 F (36.8 C)  Temp Source Axillary  BP (!) 113/49  MAP (mmHg) 67  Pulse Rate (!) 102  ECG Heart Rate (!) 106  Resp (!) 22  Weight 62.6 kg  Type of Weight Post-Dialysis  Oxygen Therapy  SpO2 100 %  O2 Device Room Air  During Treatment Monitoring  Duration of HD Treatment -hour(s) 3 hour(s)  HD Safety Checks Performed Yes  Intra-Hemodialysis Comments Tx completed  Post Treatment  Dialyzer Clearance Clear  Liters Processed 72  Fluid Removed (mL) 1200 mL  Tolerated HD Treatment Yes  Hemodialysis Catheter Right Internal jugular Double lumen Permanent (Tunneled)  Placement Date/Time: 10/28/24 0950   Serial / Lot #: [id number]  Time Out: Correct patient;Correct site;Correct procedure  Maximum sterile barrier precautions: Large sterile sheet;Cap;Sterile probe cover;Hand hygiene;Mask;Sterile gown;Sterile gloves  ...  Site Condition No complications  Blue Lumen Status Antimicrobial dead end cap;Heparin  locked;Flushed  Red Lumen Status Flushed;Antimicrobial dead end cap;Heparin  locked  Purple Lumen Status N/A  Catheter fill solution Heparin  1000 units/ml  Catheter fill volume (Arterial) 1.9 cc  Catheter fill volume (Venous) 1.9  Dressing Type Transparent  Dressing Status Antimicrobial disc/dressing in place;Clean, Dry, Intact  Drainage Description None  Dressing Change Due 01/15/25  Post treatment catheter status Capped and Clamped     Camellia Brasil LPN [facility]

## 2025-01-10 NOTE — Assessment & Plan Note (Signed)
 PEG feeds recommendations per RD - Continue TF via G-tube  - Starting Jevity 1.5 at 25 ml/hr, advance by 10ml every 4 hrs to goal of 65 ml/hr ( 1,560 ml per day)  - 60 ml ProSource TF BID - Flush 30mL q2hr -MVI tablet daily - Juven BID - Strict NPO, per tube only

## 2025-01-10 NOTE — Plan of Care (Signed)
 Spoke with daughter Hulon Ferron to provide update on plan of care and have risk/benefit conversation regarding TXA [name] would like to visit the patient tonight and try to have conversation with him about it, hold off for now Plan to reach out to her again tomorrow 01-21-2025 for decision   Payton Coward, MD 01/10/25 11:33 AM

## 2025-01-10 NOTE — Progress Notes (Incomplete)
 "    Daily Progress Note Intern Pager: [phone]  Patient name: Ricardo [name] Medical record number: [id number] Date of birth: 1951/10/26 Age: 74 y.o. Gender: male  Primary Care Provider: [facility] [place] Consultants: GI, Urology, Palliative Code Status: DNR/DNI  Pt Overview and Major Events to Date:  01/15: Admitted for GI bleed   Medical Decision Making: Patient is a 74 yo M admitted from specialty Hospital for suspected GI bleed and hematuria.  Patient is now s/p 7 units pRBC.  Overnight, blood culture resulted positive with Pseudomonas, started patient on cefepime  per pharmacy.  Patient is being followed by palliative.  Pertinent past medical history includes ESRD on dialysis, chronic osteomyelitis, A-fib, severe protein calorie malnutrition with PEG tube.  Assessment & Plan Primary myelofibrosis (HCC) Ischemic colitis CTAP 1/16 negative for acute GI bleed.  Patient continues to require transfusions.  Concern acute blood loss may be from ischemic colitis versus hematuria. - GI consulted in the ED, patient is poor candidate for endoscopy. 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 Blood culture positive for pseudomonas on 1/24.  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  - Urology following, appreciate recs. In brief, voiding trial 1/18 with return of hematuria three-way coud placed 1/19, failed to improve with integration.  CBI restarted 1/20.  - Considering IV TXA- will need to decide risk/benefit with family  - will need to contact urology once decision is made  - paraphimosis reduced 01/18  Dialysis dependent ESRD Has been on renal replacement therapy since August 2025 with HD TTS schedule.  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 Was treated at OSH with IV meropenem  and linezolid  which was discontinued per ID as patient had sufficient course for indication. -Surgery consulted, risk of surgical debridement outweigh benefit -Turn patient every 2 hours -Wound care per [facility] consult -PT/OT following -Pain management: Tylenol  1000 mg every 6 hours, oxycodone  10 mg every 6 hours  Atrial fibrillation (HCC) Anticoagulation inappropriate given high risk of bleed. Unfortunately DBP dropping. Expect will need to gradually discontinue all BP/HR medications.  - Continue metoprolol  to 12.5mg  BID  - Hold home diltiazem  90mg , reassess need if HR increases Severe protein-calorie malnutrition PEG (percutaneous endoscopic gastrostomy) status (HCC) Underweight (BMI < 18.5) PEG feeds recommendations per RD - Continue TF via G-tube  - Starting Jevity 1.5 at 25 ml/hr, advance by 10ml every 4 hrs to goal of 65 ml/hr (  1,560 ml per day)  - 60 ml ProSource TF BID - Flush 30mL q2hr -MVI tablet daily - Juven BID - Strict NPO, per tube  only Chronic health problem A fib: Not on anticoagulation given high risk of bleed, continue metoprolol  12.5 mg twice daily, EKG showed ***, will *** dilt  H/O metabolic encephalopathy and insomnia continue home sertraline  50 mg daily per tube; continue holding home trazodone  100 mg daily Hypotension: Continue home midodrine  10 mg per tube 3 times daily Hypothyroidism: Continue home Synthroid  25 mcg daily  Neuropathy: Continue gabapentin  100 mg daily at bedtime  FEN/GI: PEG tube in place, TB per RD, NPO PPx: none d/t high bleed risk  Dispo: pending clinical improvement   Subjective:  ***  Objective: Temp:  [97.8 F (36.6 C)-99.8 F (37.7 C)] 98.6 F (37 C) (01/24 2006) Pulse Rate:  [98-140] 103 (01/24 2006) Resp:  [16-27] 17 (01/24 2006) BP: (79-142)/(42-66) 142/66 (01/24 2006) SpO2:  [93 %-100 %] 100 % (01/24 2006) Weight:  [58 kg-64 kg] 62.6 kg (01/24 1200) Physical Exam: General: *** Cardiovascular: *** Respiratory: *** Abdomen: *** Extremities: ***  Laboratory: Most recent CBC Lab Results  Component Value Date   WBC 8.9 01/10/2025   HGB 8.3 (L) 01/10/2025   HCT 24.4 (L) 01/10/2025   MCV 90.2 01/10/2025   PLT 79 (L) 01/10/2025   Most recent BMP    Latest Ref Rng & Units 01/10/2025    9:00 AM  BMP  Glucose 70 - 99 mg/dL 891   BUN 8 - 23 mg/dL 888   Creatinine 9.38 - 1.24 mg/dL 7.49   Sodium 864 - 854 mmol/L 131   Potassium 3.5 - 5.1 mmol/L 5.8   Chloride 98 - 111 mmol/L 92   CO2 22 - 32 mmol/L 28   Calcium  8.9 - 10.3 mg/dL 8.7     Other pertinent labs ***   Imaging/Diagnostic Tests: Radiologist Impression: *** My interpretation: PIERRETTE Ogren, Marysa Wessner, MD 01/10/2025, 9:39 PM  PGY-2, [facility] FPTS Intern pager: [phone], text pages welcome Secure chat group [facility] Teaching Service   "

## 2025-01-10 NOTE — Assessment & Plan Note (Signed)
 Anticoagulation inappropriate given high risk of bleed. Unfortunately DBP dropping. Expect will need to gradually discontinue all BP/HR medications.  - Continue metoprolol  to 12.5mg  BID  - Hold home diltiazem  90mg , reassess need if HR increases

## 2025-01-10 NOTE — Assessment & Plan Note (Signed)
 Severe sacral pressure ulcer with osteomyelitis and chronic pain, present on admission. Was on IV meropenem  and linezolid  at OSH, discontinued per ID as patient had completed sufficient course for indication and likely contribution to thrombocytopenia.   -- Risks of surgical debridement outweigh benefit per general surgery, signed off -- Turn patient over 2 hours while in bed, pressure relief every 15 minutes when in chair  -- Continue wound care per [facility] consult note 1/16 -- PT/OT eval and treat -Pain management as above

## 2025-01-10 NOTE — Progress Notes (Signed)
 S: 44M floor nurse said they irrigated a few clots out of bladder overnight. Ricardo [name] is now in HD this AM. CBI ran out and was dry/off. Reviewed renal US  from yesterday - some clot around balloon but bladder not distended with clot.   O: NAD Abd - soft, NT, ND GU - uncirc penis, hematuria catheter in place. Urine clear in tubing. Foley bag full. Urine looks grade 2-3 in bag.   Procedure: foley was hand irrigated and urine light to pink - grade 1 - 3 but mainly 1-2. I made sure only 10 cc in balloon and balloon/catheter appropriately positioned in bladder and mobile. Bladder irrigated with equal return. Drained foley bag, reconnected a new bag of fluid and restarted CBI. On slow gtt grade 1-2.   Assessment/Plan: # Radiation cystitis # Clot retention of urine   38f hematuria Foley placed by Dr. Watt on 01/02/2025 with large volume of clot material hand irrigated.     Patient continued to improve over the weekend and patient underwent voiding trial 01/04/2025.     Return of hematuria with 3 way hematuria coude placed 1/19. Failed to improve with hand irrigation. CBI restarted 1/20.    Mild urinary bleeding, notable blood in stool 1/19. Transfuse < 7.0   Considering IV TXA +/- bladder instillation and dwell pending input from pharmacy. Appears to dialyze off well. If no clear contraindication, should be able to administer tomorrow am, a few hours before his HD.   Declined by primary team pending heme/onc assessment.  Hematuria slightly worse today.  Please contact urology once a decision regarding proposed treatment has been made. 1/24 - hematuria is light, foley irrigates normally, no significant clot, continue slow cbi gtt.

## 2025-01-10 NOTE — Assessment & Plan Note (Signed)
 No evidence of acute GI bleed on CTAP 1/16, likely secondary to ischemic colitis or could be bladder hematoma - GI recommendations as follows:  - Avoid endoscopic procedure given risks outweighing benefits  - Monitor hemoglobin q8-12 hours - Heme/Onc consulted, appreciate recs  - Multifactorial causes of anemia: GI bleeding, hematuria, myelofibrosis, end-stage renal disease  - Recommend PRBC transfusion for hemoglobin <7 or plt <20   - Cleared to use TXA given GOC palliative - Continue TF per RD rec'  - Continue Protonix  40 mg Daily  - Appreciate ongoing palliative discussions - Pain control: Tylenol , oxycodone  10 mg scheduled - Fall and delirium precautions

## 2025-01-10 NOTE — Assessment & Plan Note (Signed)
 Likely 2/2 radiation cystitis from hx of prostate Ca.  Likely radiation cystitis (history of prostate cancer)  - Urology consult, appreciate recommendations - paraphimosis reduced 01/18 - 41f foley placed 01/20 - Consider IV TXA: Okay by heme-onc, plan for risk-benefit discussion with patient daughter   - Consider cystoscopy with fulguration if continue bleeding if can tolerate sedation - Continue Bladder irrigation

## 2025-01-10 NOTE — Assessment & Plan Note (Signed)
-   Nephrology consult, appreciate recommendations   - Continue TTS HD schedule  --Daily RFP, Mg  --Strict I&O - Avoid nephrotoxic medications

## 2025-01-10 NOTE — Assessment & Plan Note (Signed)
 Has been on renal replacement therapy since August 2025 with HD TTS schedule.  - Continue TTS HD schedule  --Daily RFP, Mg  --Strict I&O - Avoid nephrotoxic medications

## 2025-01-10 NOTE — Progress Notes (Signed)
 PHARMACY - PHYSICIAN COMMUNICATION CRITICAL VALUE ALERT - BLOOD CULTURE IDENTIFICATION (BCID)  Ricardo [name] is an 74 y.o. male who presented to [facility] on 01/01/2025 with a chief complaint of GI bleed  Assessment:  2 of 4 bottles positive for p aeruginosa  Name of physician (or Provider) Contacted: FMTS  Current antibiotics: none  Changes to prescribed antibiotics recommended:  Recommendations accepted by provider, start cefepime   Results for orders placed or performed during the hospital encounter of 01/01/25  Blood Culture ID Panel (Reflexed) (Collected: 01/09/2025 10:56 PM)  Result Value Ref Range   Enterococcus faecalis NOT DETECTED NOT DETECTED   Enterococcus Faecium NOT DETECTED NOT DETECTED   Listeria monocytogenes NOT DETECTED NOT DETECTED   Staphylococcus species NOT DETECTED NOT DETECTED   Staphylococcus aureus (BCID) NOT DETECTED NOT DETECTED   Staphylococcus epidermidis NOT DETECTED NOT DETECTED   Staphylococcus lugdunensis NOT DETECTED NOT DETECTED   Streptococcus species NOT DETECTED NOT DETECTED   Streptococcus agalactiae NOT DETECTED NOT DETECTED   Streptococcus pneumoniae NOT DETECTED NOT DETECTED   Streptococcus pyogenes NOT DETECTED NOT DETECTED   A.calcoaceticus-baumannii NOT DETECTED NOT DETECTED   Bacteroides fragilis NOT DETECTED NOT DETECTED   Enterobacterales NOT DETECTED NOT DETECTED   Enterobacter cloacae complex NOT DETECTED NOT DETECTED   Escherichia coli NOT DETECTED NOT DETECTED   Klebsiella aerogenes NOT DETECTED NOT DETECTED   Klebsiella oxytoca NOT DETECTED NOT DETECTED   Klebsiella pneumoniae NOT DETECTED NOT DETECTED   Proteus species NOT DETECTED NOT DETECTED   Salmonella species NOT DETECTED NOT DETECTED   Serratia marcescens NOT DETECTED NOT DETECTED   Haemophilus influenzae NOT DETECTED NOT DETECTED   Neisseria meningitidis NOT DETECTED NOT DETECTED   Pseudomonas aeruginosa DETECTED (A) NOT DETECTED   Stenotrophomonas maltophilia  NOT DETECTED NOT DETECTED   Candida albicans NOT DETECTED NOT DETECTED   Candida auris NOT DETECTED NOT DETECTED   Candida glabrata NOT DETECTED NOT DETECTED   Candida krusei NOT DETECTED NOT DETECTED   Candida parapsilosis NOT DETECTED NOT DETECTED   Candida tropicalis NOT DETECTED NOT DETECTED   Cryptococcus neoformans/gattii NOT DETECTED NOT DETECTED   CTX-M ESBL NOT DETECTED NOT DETECTED   Carbapenem resistance IMP NOT DETECTED NOT DETECTED   Carbapenem resistance KPC NOT DETECTED NOT DETECTED   Carbapenem resistance NDM NOT DETECTED NOT DETECTED   Carbapenem resistance VIM NOT DETECTED NOT DETECTED    [name] 01/10/2025  7:52 PM

## 2025-01-10 NOTE — Progress Notes (Signed)
" °   01/09/25 2115  Assess: MEWS Score  Temp 100.3 F (37.9 C)  Assess: MEWS Score  MEWS Temp 0  MEWS Systolic 0  MEWS Pulse 2  MEWS RR 2  MEWS LOC 0  MEWS Score 4  MEWS Score Color Red  Assess: if the MEWS score is Yellow or Red  Were vital signs accurate and taken at a resting state? Yes  Does the patient meet 2 or more of the SIRS criteria? Yes  Does the patient have a confirmed or suspected source of infection? Yes  MEWS guidelines implemented  Yes, red  Treat  MEWS Interventions Considered administering scheduled or prn medications/treatments as ordered  Take Vital Signs  Increase Vital Sign Frequency  Red: Q1hr x2, continue Q4hrs until patient remains green for 12hrs  Escalate  MEWS: Escalate Red: Discuss with charge nurse and notify provider. Consider notifying RRT. If remains red for 2 hours consider need for higher level of care  Notify: Charge Nurse/RN  Name of Charge Nurse/RN Notified Hotel Manager  Assess: SIRS CRITERIA  SIRS Temperature  0  SIRS Respirations  1  SIRS Pulse 1  SIRS WBC 0  SIRS Score Sum  2   Patient currently in Red MEWs d/t elevated HR and elevate Respiratory Rate.  Also, had Rectal Temp of 100.3.  He is very restless and moaning.  Patient also had critical lab value - Hgb 6.8/Hct 20.8.  Dr Suzen made aware.  Physician came to bedside to assess patient.  New orders received and implemented.  Red MEWS protocol implemented.  Suzen Ice RN "

## 2025-01-10 NOTE — Assessment & Plan Note (Signed)
 H/o metabolic encephalopathy and insomnia: continue home sertraline  50mg  per tube daily; holding home trazodone  100mg  daily Hypotension: continue home midodrine  10mg  per tube TID Hypothyroidism: continue home levothyroxine  per tube daily Neuropathy: continue gabapentin  100mg  per tube QHS

## 2025-01-10 NOTE — Progress Notes (Signed)
 " [facility] Progress Note   Subjective:    Pt seen in room Opens eyes to voice  Objective Vitals:   01/10/25 0850 01/10/25 0900 01/10/25 0915 01/10/25 0930  BP: (!) 130/55 (!) 140/53 (!) 123/48 (!) 121/43  Pulse: 98 100 (!) 103 (!) 103  Resp: (!) 26 (!) 25 (!) 23 (!) 23  Temp:    97.8 F (36.6 C)  TempSrc:    Oral  SpO2: 100% 97% 97% 97%  Weight:      Height:       Physical Exam General: chronically ill appearing, very frail male in NAD Heart: RRR, no murmurs Lungs: Clear anteriorly, respirations unlabored Abdomen: Soft and non-distended Extremities: No edema b/l lower extremities Dialysis Access:  TDC  Last 2 HD sessions here ->  3h  0- 1 L UF  TDC  Heparin  low dose  Bun 40-100 here, creat 1.6- 3.2    Assessment/Plan:  Dialysis dependent AKI  -has been on renal replacement therapy since August 2025 (since [facility] to [facility]), had been doing HD TTS at [facility] - may be deemed as ESRD - some UOP but not enough to stop dialysis at this time - HD TTS schedule here  - next HD Sat   Anemia of Chronic Kidney Disease ABLA, GI Bleed Transfusion dependent myelofibrosis -transfuse prn for hgb <7 -GI following--conservative mgmt for now   Hematuria/ radiation cystitis -urology following -s/p foley catheter placement -s/p irrigation with urology 1/16 - s/p irrigation again today 1/24, cont slow cbi gtt    Chronic osteomyelitis 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, unstageable (HCC)  -on meropenem  and linezolid   until 01/31/24 -per primary   Severe protein-calorie malnutirion -with PEG -per primary   Secondary hyperparathyroidism -phos and calcium  at goal  Atrial fibrillation - no a/c due to high risk of bleeding   GOC - palliative following - would need to dialyze in a chair 3- 4 hrs before could be dc'd to SNF - asked PT about HD in chair -> they think it his highly unlikely  that he would tolerate HD in the chair, due to combination of severe debility (needs total assist to roll over) and the sacral wound    Dispo -Monitoring for renal recovering but more likely ESRD   Myer Fret  MD  [facility] 01/10/2025, 9:38 AM  Recent Labs  Lab 01/09/25 0604 01/09/25 1934 01/09/25 2244 01/10/25 0628 01/10/25 0900  HGB 7.5*   < >  --  6.9* 6.8*  ALBUMIN 2.3*  --  2.4* 2.2*  --   CALCIUM  8.5*  --  8.8* 8.7*  --   PHOS 2.6  --   --  4.3  --   CREATININE 1.74*  --  2.30* 2.47*  --   K 4.4  --  5.3* 5.6*  --    < > = values in this interval not displayed.    Inpatient medications:  sodium chloride    Intravenous Once   sodium chloride    Intravenous Once   acetaminophen   1,000 mg Per Tube Q6H   Or   acetaminophen   650 mg Rectal Q6H   Chlorhexidine  Gluconate Cloth  6 each Topical Q0600   Chlorhexidine  Gluconate Cloth  6 each Topical Q0600   feeding supplement (PROSource TF20)  60 mL Per Tube BID   free water   30 mL Per Tube Q2H   gabapentin   100 mg Per Tube QHS   heparin   2,000 Units Dialysis Once in dialysis   latanoprost   1 drop Both Eyes QHS   levothyroxine   25 mcg Per Tube Q0600   lidocaine   2 patch Transdermal Q24H   metoprolol  tartrate  12.5 mg Per Tube BID   midodrine   10 mg Per Tube TID WC   multivitamin  1 tablet Per Tube QHS   nutrition supplement (JUVEN)  1 packet Per Tube BID BM   mouth rinse  15 mL Mouth Rinse 4 times per day   oxyCODONE   10 mg Per Tube Q6H   pantoprazole  (PROTONIX ) IV  40 mg Intravenous Q12H   sertraline   50 mg Per Tube Daily    anticoagulant sodium citrate      feeding supplement (JEVITY 1.5 CAL/FIBER) 65 mL/hr at 01/09/25 1427   sodium chloride  irrigation     alteplase , anticoagulant sodium citrate , heparin , [START ON 01-18-25] heparin , lidocaine  (PF), lidocaine -prilocaine , mouth rinse, pentafluoroprop-tetrafluoroeth        "

## 2025-01-11 ENCOUNTER — Inpatient Hospital Stay (HOSPITAL_COMMUNITY)

## 2025-01-11 DIAGNOSIS — M8660 Other chronic osteomyelitis, unspecified site: Secondary | ICD-10-CM | POA: Diagnosis not present

## 2025-01-11 DIAGNOSIS — R7881 Bacteremia: Secondary | ICD-10-CM | POA: Diagnosis not present

## 2025-01-11 DIAGNOSIS — Z931 Gastrostomy status: Secondary | ICD-10-CM | POA: Diagnosis not present

## 2025-01-11 DIAGNOSIS — E43 Unspecified severe protein-calorie malnutrition: Secondary | ICD-10-CM | POA: Diagnosis not present

## 2025-01-11 DIAGNOSIS — K922 Gastrointestinal hemorrhage, unspecified: Secondary | ICD-10-CM | POA: Diagnosis not present

## 2025-01-11 DIAGNOSIS — L89150 Pressure ulcer of sacral region, unstageable: Secondary | ICD-10-CM | POA: Diagnosis not present

## 2025-01-11 DIAGNOSIS — B965 Pseudomonas (aeruginosa) (mallei) (pseudomallei) as the cause of diseases classified elsewhere: Secondary | ICD-10-CM | POA: Diagnosis not present

## 2025-01-11 DIAGNOSIS — D471 Chronic myeloproliferative disease: Secondary | ICD-10-CM | POA: Diagnosis not present

## 2025-01-11 DIAGNOSIS — N368 Other specified disorders of urethra: Secondary | ICD-10-CM | POA: Diagnosis not present

## 2025-01-11 LAB — BPAM RBC
Blood Product Expiration Date: 202602012359
Blood Product Expiration Date: 202602012359
ISSUE DATE / TIME: 202601240110
ISSUE DATE / TIME: 202601240925
Unit Type and Rh: 9500
Unit Type and Rh: 9500

## 2025-01-11 LAB — TYPE AND SCREEN
ABO/RH(D): O NEG
Antibody Screen: NEGATIVE
Unit division: 0
Unit division: 0

## 2025-01-11 LAB — GLUCOSE, CAPILLARY: Glucose-Capillary: 96 mg/dL (ref 70–99)

## 2025-01-11 LAB — LACTIC ACID, PLASMA: Lactic Acid, Venous: 3.7 mmol/L (ref 0.5–1.9)

## 2025-01-11 MED ORDER — LACTATED RINGERS IV BOLUS
500.0000 mL | Freq: Once | INTRAVENOUS | Status: AC
  Administered 2025-01-11: 500 mL via INTRAVENOUS

## 2025-01-12 LAB — CULTURE, BLOOD (ROUTINE X 2)
Special Requests: ADEQUATE
Special Requests: ADEQUATE

## 2025-01-18 NOTE — Assessment & Plan Note (Signed)
 Anticoagulation inappropriate given high risk of bleed. Unfortunately DBP dropping. Expect will need to gradually discontinue all BP/HR medications.  - Continue metoprolol  to 12.5mg  BID  - Hold home diltiazem  90mg , reassess need if HR increases

## 2025-01-18 NOTE — Progress Notes (Signed)
 SPIRITUAL CARE AND COUNSELING CONSULT NOTE   VISIT SUMMARY Chaplain responded to unit page following pt Jamarr' death. Daughter [name] bedside. Emotional and spiritual support provided as indicated below. This is a great loss for [name], who felt very connected to her father and states she has a limited support system outside of him.  Patient Placement card and funeral homes list given as well. Upon leaving, [name] was on the phone with a loved one. She is aware that chaplains continue to remain available as needed.  SPIRITUAL ENCOUNTER                                                                                                                                                                      Type of Visit: Initial Care provided to:: Family Referral source: Physician Reason for visit: Patient death OnCall Visit: Yes  INTERVENTIONS   Spiritual Care Interventions Made: Established relationship of care and support, Compassionate presence, Reflective listening, Narrative/life review, Meaning making, Prayer, Supported grief process   If immediate needs arise, please contact [place] 24 hour on call [phone]   Donnice JINNY Shuck, Chaplain  02/08/25 7:47 AM

## 2025-01-18 NOTE — Death Summary Note (Signed)
 Family Medicine Teaching [facility] Death Summary  Patient name: Ricardo [name] Medical record number: [id number] Date of birth: [birth year] Age: 74 y.o. Gender: male Date of Admission: Jan 25, 2025  Date of Death: 04-Feb-2025  Admitting Physician: Krystal BIRCH McDiarmid, MD  Primary Care Provider: [facility] [place] Consultants: GI, Urology, Palliative   Indication for Hospitalization: Acute GI bleed   Discharge Diagnoses/Problem List:  Principal Problem:   GI bleed Active Problems:   Primary myelofibrosis (HCC)   Dialysis dependent ESRD   Hematuria   Atrial fibrillation (HCC)   Chronic osteomyelitis (HCC)   Severe protein-calorie malnutrition   PEG (percutaneous endoscopic gastrostomy) status (HCC)   Underweight (BMI < 18.5)   Acute blood loss anemia   Urethral erosion by catheter   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 His hospital course is detailed below:  GI Bleed I Primary Myelofibrosis Patient presented to the ED from [facility] (prolonged stay for septic shock, GI bleed, hematuria) for severe anemia to 5.5 and BRBPR (+FOB).  Received 2 units PRBC at dialysis and 1 unit PRBC in the ED.  GI recommended clear liquids and IV PPI.  Patient received a total of 7 units of blood throughout admission. Patient developed cough on night of 1/24 and found to have psuedomonas in blood. Patient was started on cefepime  on 1/24. On AM of 02/04/25, patient began to have agonal  breathing and lost pulse.   Hematuria Known intermittent hematuria for ~6 months PTA, bloody urine in Foley seen in ED.  Urology consulted and replaced foley w/ bladder irrigation.   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  ESRD on dialysis  Initiated after last hospitalization d/t anuric renal failure 2/2 septic shock with ATN.  Patient resumed his T/Th/Sat dialysis schedule while inpatient per nephrology.   Significant Procedures: None   Significant Labs and Imaging:  Recent Labs  Lab 01/09/25 0604 01/09/25 1934 01/10/25 0628 01/10/25 0900 01/10/25 1958  WBC 8.5  --  8.9 8.9  --   HGB 7.5*   < > 6.9* 6.8* 8.3*  HCT 21.9*   < > 20.6* 20.3* 24.4*  PLT 71*  --  76* 79*  --    < > = values in this interval not displayed.   Recent Labs  Lab 01/06/25 0324 01/06/25 0326 01/07/25 0330 01/07/25 0331 01/08/25 0332 01/09/25 0604 01/09/25 2244 01/10/25 0628 01/10/25 0900  NA  --    < >  --  132* 131* 132* 132* 130* 131*  K  --    < >  --  4.9 5.7* 4.4 5.3* 5.6* 5.8*  CL  --    < >  --  93* 92* 93* 92* 92* 92*  CO2  --    < >  --  29 26 28 27 26 28   GLUCOSE  --    < >  --  97 103* 101* 101* 106* 108*  BUN  --    < >  --  80* 109*  73* 101* 109* 111*  CREATININE  --    < >  --  2.00* 2.63* 1.74* 2.30* 2.47* 2.50*  CALCIUM   --    < >  --  8.5* 8.8* 8.5* 8.8* 8.7* 8.7*  MG 2.1  --  1.9  --   --  1.8 2.0 1.9  --   PHOS  --    < >  --  2.6 4.3 2.6  --  4.3 4.4  ALKPHOS  --   --   --   --   --   --  425*  --   --   AST  --   --   --   --   --   --  42*  --   --   ALT  --   --   --   --   --   --  23  --   --   ALBUMIN  --    < >  --  2.4* 2.3* 2.3* 2.4* 2.2* 2.4*   < > = values in this interval not displayed.   CXR: 2025/01/31 IMPRESSION: 1. Indeterminate left paravertebral soft tissue swelling, which may represent a mass, fluid collection, or inflammatory change; consider contrast-enhanced CT for further assessment. 2. Minimal bibasilar  atelectasis. 3. Right internal jugular hemodialysis catheter with tip overlying the right atrium.  Lonnie Edahi Kroening, MD 01-31-2025, 6:59 AM PGY-2, [facility] Family [facility]

## 2025-01-18 NOTE — Assessment & Plan Note (Signed)
 A fib: Not on anticoagulation given high risk of bleed, continue metoprolol  12.5 mg twice daily, EKG showed sinus tachycardia, will continue to hold dilt  H/O metabolic encephalopathy and insomnia continue home sertraline  50 mg daily per tube; continue holding home trazodone  100 mg daily Hypotension: Continue home midodrine  10 mg per tube 3 times daily Hypothyroidism: Continue home Synthroid  25 mcg daily  Neuropathy: Continue gabapentin  100 mg daily at bedtime

## 2025-01-18 NOTE — Progress Notes (Signed)
" °   2025-02-02 9173  Attending Physican Contact  Attending Physician Notified Y  Attending Physician (First and Last Name) Mahnoor Baloch MD  Post Mortem Checklist  Date of Death 02/02/25  Time of Death 0633  Pronounced By Winn Ogren MD  Next of kin notified Yes  Name of next of kin notified of death Jalyn Rosero  Contact Person's Relationship to Patient Daughter  Contact Person's Phone Number [phone]  Family Communication Notes Daughter was at bedside @ time of death  Was the patient a No Code Blue or a Limited Code Blue? Yes  Did the patient die unattended? No  Patient restrained (physical/manual hold/chemical)? *See row information* Not applicable  [employer] (previously known as [employer] [facility] [facility])  Notification Date 02/02/25  Notification Time 0826  [employer] Number [phone]  Is patient a potential donor? N  Autopsy  Autopsy requested by MD or Family ( Non ME Case) N/A  Patient and Hospital Property Returned  Dermatherapy linen/gowns NOT sent with patient or transporter Disposable Patient Transfer/ Apparel Kit used  Dead on Arrival (Emergency Department)  Patient dead on arrival? No  Medical Examiner  Is this a medical examiner's case? N  Funeral Home  Funeral home name/address/phone # [facility] unknown/ undecided  Planned location of pickup [place]    "

## 2025-01-18 NOTE — Assessment & Plan Note (Signed)
 CTAP 1/16 negative for acute GI bleed.  Patient continues to require transfusions.  Concern acute blood loss may be from ischemic colitis versus hematuria. - GI consulted in the ED, patient is poor candidate for endoscopy. - Heme/Onc consulted, believe anemia is multifactorial including GI bleed, hematuria, JAK 2 positive myelofibrosis and renal disease, believe this may also be transformed to leukemia  - Heme/Onc discussed bone marrow biopsy with family, chose not to pursue as of now   - continue supportive care with blood transfusions (transfuse if Hg <7), platelet threshold <20 or uncontrolled bleeding per in house heme/onc provider consult   - Cleared to use TXA given GOC palliative - Palliative is following, appreciate ongoing conversation with family regarding GOC - Continue Protonix  40 mg Daily  - Pain: continue tylenol  1000mg  q6h, oxycodone  10 mg q6h per tube - Fall and delirium precautions

## 2025-01-18 NOTE — Assessment & Plan Note (Signed)
 Patient has history of sepsis complicated by sacral osteomyelitis and wounds. Blood culture positive for pseudomonas on 1/24.  - cefepime  per pharmacy, awaiting sensitivities - AM CBC w diff - LA pending - watch vitals  - consider ID referral given history of sepsis

## 2025-01-18 NOTE — Plan of Care (Addendum)
 FMTS Night Progress Note  S: Ricardo [name] is a 74 y.o. male with a pertinent history of ESRD on HD, chronic osteomyelitis, Afib, severe protein calorie malnutrition w/ PED tube, and JAK2+ primary myelofibrosis admitted for suspected GIB and hematuria now undergoing CBI as well as workup for hematological abnormalities.  Also s/p 7 units pRBC and growing Pseudomonas in BCx this PM.  Called to bedside by RN due to tachycardia and increased blood clots in CBI catheter.  Patient appears to be in discomfort.  Saturating well on RA, tachycardic.  Noted blood clots in urinary catheter and bag.  O: BP 123/69   Pulse (!) 150   Temp 98.6 F (37 C) (Oral)   Resp 17   Ht 6' (1.829 m)   Wt 62.6 kg   SpO2 100%   BMI 18.72 kg/m   General: Thin, deconditioned.  Resting uncomfortably in bed, appears to be in pain, alert, responds to voice, speaks quietly and poorly intelligible. Cardiovascular: Tachycardic rate and regular rhythm. No murmurs, rubs, or gallops appreciated. Pulmonary: Clear bilaterally to ascultation. No wheezes, crackles, or rhonchi. Normal WOB on room air. Abdominal: Mild tenderness to deep palpation over suprapubic region. No rebound or guarding. No HSM. Skin: Warm and dry.  Not diaphoretic or hot to touch. Extremities: No peripheral edema bilaterally. Capillary refill <2 seconds.   A/P: Pseudomonas bacteremia Now growing pseudomonas in BCx.  Cefepime  started earlier this PM.  Lactate mildly elevated 2.1>3.3.  Remains afebrile. - Repeat lactate in process  Tachycardia Tachycardic this PM, but acutely worsened ~105>150.  This is in setting of sacral dressing change and rolling, which likely increased pain.  Recent EKG ~2 hours ago showing sinus tach.  Regular rhythm on auscultation.  No respiratory distress.  Will treat pain with spot dose oxy per tube and also administer fluid bolus (no CHF noted on chart review).  If not improved, would repeat EKG and assess further.  Repeat  lactate in process now.  Considering poorly controlled pain vs sepsis vs Afib.  Low concern for PE at this point given normal O2 sat and no respiratory distress. - 500 mL LR bolus - +Oxy IR 5 mg per tube - Reassess in 30 minutes to an hour and repeat EKG if still tachy  Hematuria Anemia Significant blood clot on urinary bladder US  1/23 and on CBI.  Reportedly, urine now more cranberry compared to prior color, though still better compared to Thursday per night RN.  Few clots also noted in catheter.  Hgb improved 5.8>8.3 this PM (~4 hours ago).  Shitarev, Dimitry, MD 01-23-2025, 12:01 AM PGY-2, [facility] Night Resident  Please page [phone] with questions    UPDATE 0140 HR 135-145, mildly improved.  Increased cough requiring suction, though still saturation 100% on RA.  Clots continuing from bladder.  Lactate increased 3.3>3.7.  Some concern for early sepsis, will monitor closely.  BP reassuringly staying normal and stable.  Lower concern for lung pathology and reassuring auscultation prior evaluation, but given multiple transfusions and ESRD, there is some chance of volume overload in lungs. - Repeat EKG - CXR - Additional 500 mL LR bolus pending CXR; using caution given ESRD - AM CBC w/ diff already ordered   UPDATE 0210 Evaluated patient with Dr. Lonnie.  Increased cough productive of white sputum, but still saturating 100% on RA.  Difficult to auscultate lungs, but no clear crackles or wheezes, overall sounding okay.  Repeat EKG sinus tach, unchanged from prior.  Next  oxy in ~1.5 hours, want to use caution to avoid respiratory depression, in particular in setting of cough.  Awaiting CXR.   UPDATE 0240 CXR overall unchanged from prior 1/23.  Some opacity between RML and RLL, but minimal and no evidence of significant volume overload. - Will hold second 500 mL bolus, limit fluids to avoid volume overload in setting of ESRD  UPDATE 6:25 Paged by nursing as patient had  agonal breathing. On arrival with Dr. Toma at bedside, patient unresponsive and bradycardic. Discussed goals of care with daughter, who opted not to pursue further care and wanted to let her father pass naturally. Patient continued to be bradycardic and lost pulse at 6:33 AM. Time of death 01-19-2025 6:33 AM. Nursing at bedside for support.

## 2025-01-18 NOTE — Progress Notes (Signed)
 During the night, the patient's condition worsened.  He was restless and moaning.  When asked he admitted to being in pain.  Also, his HR was sustaining between 135 - 175.  2 EKG done and both showed ST.  He was not tolerating turns.  His Foley Catheter was requiring multiple hand irrigations to remove numerous blood clots from the catheter.  Family Medicine came to the room multiple times to assess patients.  Orders implemented.  At approximately 0610, while removing clots from the catheter, the patient became unresponsive with agonal breathing.  His HR went down to 35.  Family Medicine called to the bedside.  Daughter was at the bedside.  Death pronounced @ [phone].  Suzen Ice RN

## 2025-01-18 DEATH — deceased

## 2025-01-21 ENCOUNTER — Telehealth: Payer: Self-pay

## 2025-01-21 NOTE — Telephone Encounter (Signed)
 Called Kia.   Advised this has not been received by [facility].   Confirmed all of Dr. Roark information.  She reports she will resend.

## 2025-01-21 NOTE — Telephone Encounter (Signed)
 Kia with [employer] calls nurse line in regards to death certificate.   She reports this was faxed to our office several times to Dr. Madelon.   She reports she was directed by [facility] to reach out to our office, as patient passed away on our service.  Advised will forward to Dr. Rumball for advisement.

## 2025-01-23 NOTE — Telephone Encounter (Signed)
 Death certificate on [place] [facility] complete.

## 2025-01-23 NOTE — Telephone Encounter (Signed)
 Ricardo [name] calls nurse line for an update.   She reports she resubmitted information to [facility], however the death certificate has not been signed as of yet.  Advised will forward to Dr. Rumball for an update.
